# Patient Record
Sex: Female | Born: 1945 | Race: Black or African American | Hispanic: No | Marital: Married | Smoking: Former smoker
Health system: Southern US, Community
[De-identification: ages and names within clinical notes are randomized; demographics above are authoritative.]

## PROBLEM LIST (undated history)

## (undated) DIAGNOSIS — I639 Cerebral infarction, unspecified: Secondary | ICD-10-CM

## (undated) DIAGNOSIS — M1990 Unspecified osteoarthritis, unspecified site: Secondary | ICD-10-CM

## (undated) DIAGNOSIS — K219 Gastro-esophageal reflux disease without esophagitis: Secondary | ICD-10-CM

## (undated) DIAGNOSIS — R002 Palpitations: Secondary | ICD-10-CM

## (undated) DIAGNOSIS — D649 Anemia, unspecified: Secondary | ICD-10-CM

## (undated) DIAGNOSIS — I10 Essential (primary) hypertension: Secondary | ICD-10-CM

## (undated) DIAGNOSIS — M8580 Other specified disorders of bone density and structure, unspecified site: Secondary | ICD-10-CM

## (undated) DIAGNOSIS — K635 Polyp of colon: Secondary | ICD-10-CM

## (undated) DIAGNOSIS — E7800 Pure hypercholesterolemia, unspecified: Secondary | ICD-10-CM

## (undated) HISTORY — DX: Palpitations: R00.2

## (undated) HISTORY — DX: Polyp of colon: K63.5

## (undated) HISTORY — DX: Unspecified osteoarthritis, unspecified site: M19.90

## (undated) HISTORY — DX: Cerebral infarction, unspecified: I63.9

## (undated) HISTORY — DX: Other specified disorders of bone density and structure, unspecified site: M85.80

## (undated) HISTORY — DX: Essential (primary) hypertension: I10

## (undated) HISTORY — DX: Gastro-esophageal reflux disease without esophagitis: K21.9

## (undated) HISTORY — DX: Anemia, unspecified: D64.9

## (undated) HISTORY — DX: Pure hypercholesterolemia, unspecified: E78.00

---

## 1997-08-20 ENCOUNTER — Emergency Department (HOSPITAL_COMMUNITY): Admission: EM | Admit: 1997-08-20 | Discharge: 1997-08-20 | Payer: Self-pay | Admitting: Emergency Medicine

## 1997-09-27 ENCOUNTER — Encounter: Admission: RE | Admit: 1997-09-27 | Discharge: 1997-12-26 | Payer: Self-pay | Admitting: Orthopedic Surgery

## 1998-01-17 ENCOUNTER — Encounter: Payer: Self-pay | Admitting: Nephrology

## 1998-01-17 ENCOUNTER — Ambulatory Visit (HOSPITAL_COMMUNITY): Admission: RE | Admit: 1998-01-17 | Discharge: 1998-01-17 | Payer: Self-pay | Admitting: Nephrology

## 1999-06-06 ENCOUNTER — Encounter: Payer: Self-pay | Admitting: Nephrology

## 1999-06-06 ENCOUNTER — Ambulatory Visit (HOSPITAL_COMMUNITY): Admission: RE | Admit: 1999-06-06 | Discharge: 1999-06-06 | Payer: Self-pay | Admitting: Nephrology

## 2003-03-08 ENCOUNTER — Encounter: Admission: RE | Admit: 2003-03-08 | Discharge: 2003-03-08 | Payer: Self-pay | Admitting: Family Medicine

## 2003-09-24 ENCOUNTER — Ambulatory Visit (HOSPITAL_COMMUNITY): Admission: RE | Admit: 2003-09-24 | Discharge: 2003-09-24 | Payer: Self-pay | Admitting: Sports Medicine

## 2005-06-07 ENCOUNTER — Encounter: Admission: RE | Admit: 2005-06-07 | Discharge: 2005-06-07 | Payer: Self-pay | Admitting: Family Medicine

## 2007-03-10 LAB — HM COLONOSCOPY

## 2008-10-26 ENCOUNTER — Encounter: Admission: RE | Admit: 2008-10-26 | Discharge: 2008-10-26 | Payer: Self-pay | Admitting: Family Medicine

## 2008-10-26 LAB — HM DEXA SCAN

## 2010-01-04 ENCOUNTER — Encounter: Admission: RE | Admit: 2010-01-04 | Discharge: 2010-01-04 | Payer: Self-pay | Admitting: Family Medicine

## 2010-04-29 ENCOUNTER — Encounter: Payer: Self-pay | Admitting: Family Medicine

## 2010-05-01 ENCOUNTER — Encounter: Payer: Self-pay | Admitting: Family Medicine

## 2012-02-26 ENCOUNTER — Other Ambulatory Visit: Payer: Self-pay | Admitting: Family Medicine

## 2012-02-26 DIAGNOSIS — Z1231 Encounter for screening mammogram for malignant neoplasm of breast: Secondary | ICD-10-CM

## 2012-04-03 ENCOUNTER — Ambulatory Visit
Admission: RE | Admit: 2012-04-03 | Discharge: 2012-04-03 | Disposition: A | Discharge status: Home / Self Care | Payer: BC Managed Care – PPO | Source: Ambulatory Visit | Attending: Family Medicine | Admitting: Family Medicine

## 2012-04-03 DIAGNOSIS — Z1231 Encounter for screening mammogram for malignant neoplasm of breast: Secondary | ICD-10-CM

## 2013-03-19 ENCOUNTER — Encounter: Payer: Self-pay | Admitting: Family Medicine

## 2013-03-19 ENCOUNTER — Ambulatory Visit (INDEPENDENT_AMBULATORY_CARE_PROVIDER_SITE_OTHER): Payer: Medicare Other | Admitting: Family Medicine

## 2013-03-19 VITALS — BP 110/70 | HR 94 | Temp 98.2°F | Resp 18 | Ht 65.5 in | Wt 178.0 lb

## 2013-03-19 DIAGNOSIS — R002 Palpitations: Secondary | ICD-10-CM | POA: Insufficient documentation

## 2013-03-19 DIAGNOSIS — Z86010 Personal history of colonic polyps: Secondary | ICD-10-CM

## 2013-03-19 DIAGNOSIS — Z0000 Encounter for general adult medical examination without abnormal findings: Secondary | ICD-10-CM

## 2013-03-19 DIAGNOSIS — K635 Polyp of colon: Secondary | ICD-10-CM | POA: Insufficient documentation

## 2013-03-19 DIAGNOSIS — Z23 Encounter for immunization: Secondary | ICD-10-CM

## 2013-03-19 DIAGNOSIS — M8580 Other specified disorders of bone density and structure, unspecified site: Secondary | ICD-10-CM | POA: Insufficient documentation

## 2013-03-19 NOTE — Progress Notes (Signed)
Subjective:    Patient ID: Nina Olson, female    DOB: 1946-03-30, 67 y.o.   MRN: [id number]  HPI Patient is a very pleasant 67 year old African American female who presents today for complete physical exam. Of note she complains of right lower quadrant abdominal tenderness. This began last week. It was moderate in intensity for several days and then spontaneously improved over the next 3-4. Today is essentially gone. She is feeling fine right now. She denies any fevers or chills. She denies any nausea vomiting or diarrhea. She denies any melena or hematochezia. Otherwise she is doing well. She has no other concerns. She is due today for a flu shot as well as Prevnar 13. She is due for mammogram. Her last colonoscopy was in 2008. She was found to have a sessile polyp. She is due for colonoscopy. Her last Pap smear was in November of 2013 was normal. Her last tetanus shot was in 1998 and is due. Past Medical History  Diagnosis Date  . Osteopenia   . Palpitations   . GERD (gastroesophageal reflux disease)   . Colon polyps    Past Surgical History  Procedure Laterality Date  . Cesarean section     No current outpatient prescriptions on file prior to visit.   No current facility-administered medications on file prior to visit.   No Known Allergies History   Social History  . Marital Status: Married    Spouse Name: N/A    Number of Children: N/A  . Years of Education: N/A   Occupational History  . Not on file.   Social History Main Topics  . Smoking status: Former Games developer  . Smokeless tobacco: Former Neurosurgeon    Quit date: 04/09/1978  . Alcohol Use: No  . Drug Use: No  . Sexual Activity: No   Other Topics Concern  . Not on file   Social History Narrative  . No narrative on file   Family History  Problem Relation Age of Onset  . Hypertension Mother   . Osteoporosis Mother       Review of Systems  All other systems reviewed and are negative.       Objective:   Physical Exam  Vitals reviewed. Constitutional: She is oriented to person, place, and time. She appears well-developed and well-nourished. No distress.  HENT:  Head: Normocephalic and atraumatic.  Right Ear: External ear normal.  Left Ear: External ear normal.  Nose: Nose normal.  Mouth/Throat: Oropharynx is clear and moist. No oropharyngeal exudate.  Eyes: Conjunctivae and EOM are normal. Pupils are equal, round, and reactive to light. Right eye exhibits no discharge. Left eye exhibits no discharge. No scleral icterus.  Neck: Normal range of motion. Neck supple. No JVD present. No tracheal deviation present. No thyromegaly present.  Cardiovascular: Normal rate, regular rhythm, normal heart sounds and intact distal pulses.  Exam reveals no gallop and no friction rub.   No murmur heard. Pulmonary/Chest: Effort normal and breath sounds normal. No stridor. No respiratory distress. She has no wheezes. She has no rales. She exhibits no tenderness.  Abdominal: Soft. Bowel sounds are normal. She exhibits no distension. There is no tenderness. There is no rebound and no guarding.  Musculoskeletal: Normal range of motion. She exhibits no edema and no tenderness.  Lymphadenopathy:    She has no cervical adenopathy.  Neurological: She is alert and oriented to person, place, and time. She has normal reflexes. She displays normal reflexes. No cranial nerve deficit. She exhibits  normal muscle tone. Coordination normal.  Skin: Skin is warm. No rash noted. She is not diaphoretic. No erythema. No pallor.  Psychiatric: She has a normal mood and affect. Her behavior is normal. Judgment and thought content normal.   Patient is slightly tender to palpation in the right lower quadrant. There is no guarding or rebound. There is no evidence of peritoneal signs       Assessment & Plan:  1. Need for prophylactic vaccination and inoculation against influenza - Flu Vaccine QUAD 36+ mos IM  2. Need for  prophylactic vaccination against Streptococcus pneumoniae (pneumococcus) - Pneumococcal conjugate vaccine 13-valent IM  3. Routine general medical examination at a health care facility She declines a tetanus shot. Her flu shot and Prevnar 13 are updated. I will schedule the patient for colonoscopy as well as mammogram. She is to return fasting for a CBC, fasting lipid panel, CMP, and TSH. Her Pap smear is up to date. However the patient CAT scan of the abdomen and pelvis to rule out appendicitis. This is highly unlikely given the fact her symptoms are improving steadily for the last 3-4 days. Patient is leaving to go to [facility]. She declines the CT scan now. However she states she will immediately seek medical attention if the pain returns.

## 2013-03-20 ENCOUNTER — Other Ambulatory Visit: Payer: Self-pay | Admitting: Family Medicine

## 2013-03-20 DIAGNOSIS — Z1231 Encounter for screening mammogram for malignant neoplasm of breast: Secondary | ICD-10-CM

## 2013-04-23 ENCOUNTER — Ambulatory Visit: Payer: BC Managed Care – PPO

## 2013-05-04 ENCOUNTER — Other Ambulatory Visit: Payer: Medicare Other

## 2013-05-04 LAB — CBC WITH DIFFERENTIAL/PLATELET
Basophils Absolute: 0 10*3/uL (ref 0.0–0.1)
Basophils Relative: 1 % (ref 0–1)
Eosinophils Absolute: 0.2 10*3/uL (ref 0.0–0.7)
Eosinophils Relative: 5 % (ref 0–5)
HCT: 36 % (ref 36.0–46.0)
Hemoglobin: 11.7 g/dL — ABNORMAL LOW (ref 12.0–15.0)
Lymphocytes Relative: 41 % (ref 12–46)
Lymphs Abs: 1.7 10*3/uL (ref 0.7–4.0)
MCH: 26.7 pg (ref 26.0–34.0)
MCHC: 32.5 g/dL (ref 30.0–36.0)
MCV: 82 fL (ref 78.0–100.0)
Monocytes Absolute: 0.6 10*3/uL (ref 0.1–1.0)
Monocytes Relative: 13 % — ABNORMAL HIGH (ref 3–12)
Neutro Abs: 1.7 10*3/uL (ref 1.7–7.7)
Neutrophils Relative %: 40 % — ABNORMAL LOW (ref 43–77)
Platelets: 286 10*3/uL (ref 150–400)
RBC: 4.39 MIL/uL (ref 3.87–5.11)
RDW: 15.3 % (ref 11.5–15.5)
WBC: 4.2 10*3/uL (ref 4.0–10.5)

## 2013-05-04 LAB — COMPLETE METABOLIC PANEL WITH GFR
ALT: 15 U/L (ref 0–35)
AST: 17 U/L (ref 0–37)
Albumin: 3.9 g/dL (ref 3.5–5.2)
Alkaline Phosphatase: 91 U/L (ref 39–117)
BUN: 12 mg/dL (ref 6–23)
CO2: 28 mEq/L (ref 19–32)
Calcium: 9.3 mg/dL (ref 8.4–10.5)
Chloride: 107 mEq/L (ref 96–112)
Creat: 0.93 mg/dL (ref 0.50–1.10)
GFR, Est African American: 74 mL/min
GFR, Est Non African American: 64 mL/min
Glucose, Bld: 96 mg/dL (ref 70–99)
Potassium: 4 mEq/L (ref 3.5–5.3)
Sodium: 141 mEq/L (ref 135–145)
Total Bilirubin: 0.5 mg/dL (ref 0.3–1.2)
Total Protein: 6.9 g/dL (ref 6.0–8.3)

## 2013-05-04 LAB — LIPID PANEL
Cholesterol: 200 mg/dL (ref 0–200)
HDL: 41 mg/dL (ref >39)
LDL Cholesterol: 133 mg/dL — ABNORMAL HIGH (ref 0–99)
Total CHOL/HDL Ratio: 4.9 Ratio
Triglycerides: 128 mg/dL (ref <150)
VLDL: 26 mg/dL (ref 0–40)

## 2013-05-04 LAB — TSH: TSH: 0.771 u[IU]/mL (ref 0.350–4.500)

## 2013-05-05 ENCOUNTER — Encounter: Payer: Self-pay | Admitting: Family Medicine

## 2013-05-08 ENCOUNTER — Encounter: Payer: Self-pay | Admitting: *Deleted

## 2013-05-23 ENCOUNTER — Other Ambulatory Visit: Payer: Self-pay | Admitting: Nurse Practitioner

## 2013-05-25 ENCOUNTER — Encounter: Payer: Self-pay | Admitting: Family Medicine

## 2013-05-27 ENCOUNTER — Telehealth: Payer: Self-pay | Admitting: Family Medicine

## 2013-05-27 NOTE — Telephone Encounter (Signed)
Call back number is [phone] Pt is needing a refill on her Metoprolol [facility] is [facility] [address]

## 2013-05-28 ENCOUNTER — Other Ambulatory Visit: Payer: Self-pay | Admitting: Family Medicine

## 2013-05-28 MED ORDER — METOPROLOL TARTRATE 25 MG PO TABS: 25.0000 mg | ORAL_TABLET | Freq: Two times a day (BID) | ORAL | Status: DC

## 2013-05-28 NOTE — Telephone Encounter (Signed)
Med refilled and per dr. Dennard Schaumann ntbs in 1 month and appt made

## 2013-05-28 NOTE — Telephone Encounter (Signed)
[facility] - Do not have medication on med list

## 2013-06-26 ENCOUNTER — Ambulatory Visit: Payer: Medicare Other | Admitting: Family Medicine

## 2013-07-14 ENCOUNTER — Ambulatory Visit (INDEPENDENT_AMBULATORY_CARE_PROVIDER_SITE_OTHER): Payer: Medicare Other | Admitting: Family Medicine

## 2013-07-14 ENCOUNTER — Encounter: Payer: Self-pay | Admitting: Family Medicine

## 2013-07-14 VITALS — BP 120/76 | HR 86 | Temp 97.0°F | Resp 16 | Ht 65.5 in | Wt 185.0 lb

## 2013-07-14 DIAGNOSIS — B354 Tinea corporis: Secondary | ICD-10-CM

## 2013-07-14 MED ORDER — FLUCONAZOLE 150 MG PO TABS: ORAL_TABLET | ORAL | Status: DC

## 2013-07-14 MED ORDER — CLOTRIMAZOLE-BETAMETHASONE 1-0.05 % EX CREA: 1.0000 "application " | TOPICAL_CREAM | Freq: Two times a day (BID) | CUTANEOUS | Status: DC

## 2013-07-14 NOTE — Progress Notes (Signed)
   Subjective:    Patient ID: Nina Olson, female    DOB: 18-Jun-1945, 68 y.o.   MRN: [id number]  HPI  Patient reports a rash on the right posterior aspect of her neck the last 3 weeks it is spreading. It is 5 cm x 3.5 cm in size. It has raised rolled edges and hyperpigmented center.  KOH today in office was positive for hyphae. Past Medical History  Diagnosis Date  . Osteopenia   . Palpitations   . GERD (gastroesophageal reflux disease)   . Colon polyps    Current Outpatient Prescriptions on File Prior to Visit  Medication Sig Dispense Refill  . metoprolol tartrate (LOPRESSOR) 25 MG tablet Take 1 tablet (25 mg total) by mouth 2 (two) times daily.  60 tablet  3   No current facility-administered medications on file prior to visit.   No Known Allergies History   Social History  . Marital Status: Married    Spouse Name: N/A    Number of Children: N/A  . Years of Education: N/A   Occupational History  . Not on file.   Social History Main Topics  . Smoking status: Former Research scientist (life sciences)  . Smokeless tobacco: Former Systems developer    Quit date: 04/09/1978  . Alcohol Use: No  . Drug Use: No  . Sexual Activity: No   Other Topics Concern  . Not on file   Social History Narrative  . No narrative on file      Review of Systems  All other systems reviewed and are negative.       Objective:   Physical Exam  Vitals reviewed. Cardiovascular: Normal rate and regular rhythm.   Pulmonary/Chest: Effort normal and breath sounds normal.  Skin: Rash noted. There is erythema.          Assessment & Plan:  1. Tinea corporis The effected area is very large and also near the hairline., Treated aggressively with Diflucan 1 tablet by mouth every other day for 2 weeks. I am also going to have the patient use Lotrisone cream apply twice a day until lesion completely heals after she finishes diflucan.  Recheck in 3 weeks, sooner if worse. - fluconazole (DIFLUCAN) 150 MG tablet; 1 pill every other  day until gone  Dispense: 7 tablet; Refill: 0 - clotrimazole-betamethasone (LOTRISONE) cream; Apply 1 application topically 2 (two) times daily.  Dispense: 30 g; Refill: 0

## 2013-07-21 ENCOUNTER — Encounter: Payer: Self-pay | Admitting: Family Medicine

## 2013-07-21 ENCOUNTER — Other Ambulatory Visit: Payer: Self-pay | Admitting: Family Medicine

## 2013-07-21 DIAGNOSIS — M8580 Other specified disorders of bone density and structure, unspecified site: Secondary | ICD-10-CM

## 2013-07-23 ENCOUNTER — Emergency Department (HOSPITAL_COMMUNITY)
Admission: EM | Admit: 2013-07-23 | Discharge: 2013-07-23 | Disposition: A | Discharge status: Home / Self Care | Payer: Medicare Other | Source: Home / Self Care | Attending: Family Medicine | Admitting: Family Medicine

## 2013-07-23 ENCOUNTER — Encounter (HOSPITAL_COMMUNITY): Payer: Self-pay | Admitting: Emergency Medicine

## 2013-07-23 DIAGNOSIS — M7551 Bursitis of right shoulder: Secondary | ICD-10-CM

## 2013-07-23 DIAGNOSIS — M719 Bursopathy, unspecified: Secondary | ICD-10-CM

## 2013-07-23 DIAGNOSIS — M67919 Unspecified disorder of synovium and tendon, unspecified shoulder: Secondary | ICD-10-CM

## 2013-07-23 MED ORDER — METHYLPREDNISOLONE ACETATE 40 MG/ML IJ SUSP
INTRAMUSCULAR | Status: AC
  Filled 2013-07-23: qty 5

## 2013-07-23 MED ORDER — METHYLPREDNISOLONE ACETATE 40 MG/ML IJ SUSP: 40.0000 mg | Freq: Once | INTRAMUSCULAR | Status: DC

## 2013-07-23 NOTE — ED Provider Notes (Signed)
Nina Olson is a 68 y.o. female who presents to Urgent Care today for right shoulder pain. Patient has one day of right shoulder pain for without injury. The pain is with overhand motion and better with rest. Radiating pain weakness or numbness. No medications tried. Symptoms are moderate to severe.   Past Medical History  Diagnosis Date  . Osteopenia   . Palpitations   . GERD (gastroesophageal reflux disease)   . Colon polyps    History  Substance Use Topics  . Smoking status: Former Research scientist (life sciences)  . Smokeless tobacco: Former Systems developer    Quit date: 04/09/1978  . Alcohol Use: No   ROS as above Medications: Current Facility-Administered Medications  Medication Dose Route Frequency Provider Last Rate Last Dose  . methylPREDNISolone acetate (DEPO-MEDROL) injection 40 mg  40 mg Intra-articular Once Gregor Hams, MD       Current Outpatient Prescriptions  Medication Sig Dispense Refill  . clotrimazole-betamethasone (LOTRISONE) cream Apply 1 application topically 2 (two) times daily.  30 g  0  . fluconazole (DIFLUCAN) 150 MG tablet 1 pill every other day until gone  7 tablet  0  . metoprolol tartrate (LOPRESSOR) 25 MG tablet Take 1 tablet (25 mg total) by mouth 2 (two) times daily.  60 tablet  3    Exam:  BP 144/84  Pulse 86  Temp(Src) 97.8 F (36.6 C) (Oral)  Resp 18  SpO2 97% Gen: Well NAD HEENT: EOMI,  MMM Lungs: Normal work of breathing. CTABL Heart: RRR no MRG Abd: NABS, Soft. NT, ND Exts: Brisk capillary refill, warm and well perfused.  Right shoulder: Normal-appearing nontender Range of motion limited active abduction to 90 and external rotation to 30 beyond neutral position. Internal rotation is normal.  Strength is reduced to abduction.  Positive impingement testing.  Positive drop arm sign.   Neck: Nontender full range of motion negative Spurling test.   Capillary refill and sensation are intact distally.  Limited musculoskeletal ultrasound right shoulder.  Biceps  tendon is intact gait within the groove however there is a hypoechoic fluid collection in the tendon sheath consistent with biceps tendinitis.  The subscapularis is normal appearing, and intact. However there is a fluid collection in the superior portion consistent with subacromial bursitis.  The supraspinatus tendon is intact without significant tears. There is a subacromial bursa fluid collection.  Infraspinatus is degenerative with hyperechoic change consistent with calcific tendinitis present distally.   The a.c. Joint is degenerative effusion present.  Subacromial Injection: Right  Consent obtained and time out performed.  Area cleaned with alcohol.  40Mg  of depomedrol and 74ml of 0.5% marcaine was injected into the subacromial bursa without complication or bleeding. Patient tolerated the procedure well.     No results found for this or any previous visit (from the past 24 hour(s)). No results found.  Assessment and Plan: 68 y.o. female with right shoulder subacromial impingement bursitis. Seen on ultrasound. Doubtful for full thickness tear. Plan to treat with corticosteroid injection and range of motion exercises. Followup with primary care provider.  Discussed warning signs or symptoms. Please see discharge instructions. Patient expresses understanding.    Gregor Hams, MD 07/23/13 [phone]

## 2013-07-23 NOTE — Discharge Instructions (Signed)
Thank you for coming in today. Followup with your primary care Dr. Work on gentle range of motion Call or go to the ER if you develop a large red swollen joint with extreme pain or oozing puss.

## 2013-07-23 NOTE — ED Notes (Addendum)
C/o right shoulder and neck pain States pain starts at neck and radiates down to elbow Denies any injury

## 2014-01-01 ENCOUNTER — Ambulatory Visit (INDEPENDENT_AMBULATORY_CARE_PROVIDER_SITE_OTHER): Payer: Medicare Other | Admitting: Family Medicine

## 2014-01-01 ENCOUNTER — Encounter: Payer: Self-pay | Admitting: Family Medicine

## 2014-01-01 VITALS — BP 132/80 | HR 100 | Temp 98.1°F | Resp 20 | Ht 65.0 in | Wt 182.0 lb

## 2014-01-01 DIAGNOSIS — Z0000 Encounter for general adult medical examination without abnormal findings: Secondary | ICD-10-CM

## 2014-01-01 DIAGNOSIS — Z23 Encounter for immunization: Secondary | ICD-10-CM

## 2014-01-01 NOTE — Progress Notes (Signed)
Subjective:    Patient ID: Nina Olson, female    DOB: 1945/11/18, 68 y.o.   MRN: [id number]  HPI  Subjective:   Patient presents for Medicare Annual/Subsequent preventive examination.  Patient presents today for complete physical exam. She has no medical concerns. She is overdue for a mammogram as well as a bone density. Her last Pap smear was 2 years ago. She is due for her next Pap smear next year. She had her flu shot earlier this year. She is due for Pneumovax 23. She had Prevnar 13 last year. Otherwise preventive care is up to date. I reviewed her lab work from January. This was significant for mild anemia as well as mild hyperlipidemia. Recommend over-the-counter iron and lifestyle changes. She is overdue to recheck this. Review Past Medical/Family/Social:   Past Medical History  Diagnosis Date  . Osteopenia   . Palpitations   . GERD (gastroesophageal reflux disease)   . Colon polyps    Past Surgical History  Procedure Laterality Date  . Cesarean section     Current Outpatient Prescriptions on File Prior to Visit  Medication Sig Dispense Refill  . metoprolol tartrate (LOPRESSOR) 25 MG tablet Take 1 tablet (25 mg total) by mouth 2 (two) times daily.  60 tablet  3   Current Facility-Administered Medications on File Prior to Visit  Medication Dose Route Frequency Provider Last Rate Last Dose  . methylPREDNISolone acetate (DEPO-MEDROL) injection 40 mg  40 mg Intra-articular Once Gregor Hams, MD       No Known Allergies History   Social History  . Marital Status: Married    Spouse Name: N/A    Number of Children: N/A  . Years of Education: N/A   Occupational History  . Not on file.   Social History Main Topics  . Smoking status: Former Research scientist (life sciences)  . Smokeless tobacco: Former Systems developer    Quit date: 04/09/1978  . Alcohol Use: No  . Drug Use: No  . Sexual Activity: No   Other Topics Concern  . Not on file   Social History Narrative  . No narrative on file   Family  History  Problem Relation Age of Onset  . Hypertension Mother   . Osteoporosis Mother     Depression Screen  (Note: if answer to either of the following is "Yes", a more complete depression screening is indicated)  Over the past two weeks, have you felt down, depressed or hopeless? No Over the past two weeks, have you felt little interest or pleasure in doing things? No Have you lost interest or pleasure in daily life? No Do you often feel hopeless? No Do you cry easily over simple problems? No   Activities of Daily Living  In your present state of health, do you have any difficulty performing the following activities?:  Driving? No  Managing money? No  Feeding yourself? No  Getting from bed to chair? No  Climbing a flight of stairs? No  Preparing food and eating?: No  Bathing or showering? No  Getting dressed: No  Getting to the toilet? No  Using the toilet:No  Moving around from place to place: No  In the past year have you fallen or had a near fall?:No  Are you sexually active? No  Do you have more than one partner? No   Hearing Difficulties: No  Do you often ask people to speak up or repeat themselves? No  Do you experience ringing or noises in your ears? No  Do you have difficulty understanding soft or whispered voices? No  Do you feel that you have a problem with memory? No Do you often misplace items? No  Do you feel safe at home? Yes  Cognitive Testing  Alert? Yes Normal Appearance?Yes  Oriented to person? Yes Place? Yes  Time? Yes  Recall of three objects? Yes  Can perform simple calculations? Yes  Displays appropriate judgment?Yes  Can read the correct time from a watch face?Yes   Screening Tests / Date Colonoscopy   2008                  Zostavax  Mammogram 2013 Influenza Vaccine 2015 Tetanus/tdap   Review of Systems  All other systems reviewed and are negative.      Objective:   Physical Exam  Vitals reviewed. Constitutional: She is oriented  to person, place, and time. She appears well-developed and well-nourished. No distress.  HENT:  Head: Normocephalic and atraumatic.  Right Ear: External ear normal.  Left Ear: External ear normal.  Nose: Nose normal.  Mouth/Throat: Oropharynx is clear and moist. No oropharyngeal exudate.  Eyes: Conjunctivae and EOM are normal. Pupils are equal, round, and reactive to light. Right eye exhibits no discharge. Left eye exhibits no discharge. No scleral icterus.  Neck: Normal range of motion. Neck supple. No JVD present. No tracheal deviation present. No thyromegaly present.  Cardiovascular: Normal rate, regular rhythm, normal heart sounds and intact distal pulses.  Exam reveals no gallop and no friction rub.   No murmur heard. Pulmonary/Chest: Effort normal and breath sounds normal. No stridor. No respiratory distress. She has no wheezes. She has no rales. She exhibits no tenderness.  Abdominal: Soft. Bowel sounds are normal. She exhibits no distension and no mass. There is no tenderness. There is no rebound and no guarding.  Musculoskeletal: Normal range of motion. She exhibits no edema and no tenderness.  Lymphadenopathy:    She has no cervical adenopathy.  Neurological: She is alert and oriented to person, place, and time. She has normal reflexes. She displays normal reflexes. No cranial nerve deficit. She exhibits normal muscle tone. Coordination normal.  Skin: Skin is warm. No rash noted. She is not diaphoretic. No erythema. No pallor.  Psychiatric: She has a normal mood and affect. Her behavior is normal. Judgment and thought content normal.          Assessment & Plan:  Routine general medical examination at a health care facility - Plan: CBC with Differential, COMPLETE METABOLIC PANEL WITH GFR, Lipid panel, MM Digital Screening, DG Bone Density, Ambulatory referral to Gastroenterology  Patient's physical exam is normal. I did recommend diet exercise and weight loss. Her blood pressures  several. I like to return fasting for a CMP, fasting lipid panel, and CBC. I don't to monitor her anemia now that she is taking iron. Also will monitor her cholesterol. Her goal LDL cholesterol is less than 130. I scheduled her for colonoscopy, mammogram, bone density test. The patient received Pneumovax 23 today in clinic. Her flu shot is up-to-date. She is due for a colonoscopy due to her history of colon polyps every 5 years.. Medicare Attestation  I have personally reviewed:  The patient's medical and social history  Their use of alcohol, tobacco or illicit drugs  Their current medications and supplements  The patient's functional ability including ADLs,fall risks, home safety risks, cognitive, and hearing and visual impairment  Diet and physical activities  Evidence for depression or mood disorders  The  patient's weight, height, BMI, and visual acuity have been recorded in the chart. I have made referrals, counseling, and provided education to the patient based on review of the above and I have provided the patient with a written personalized care plan for preventive services.

## 2014-01-04 ENCOUNTER — Encounter: Payer: Self-pay | Admitting: *Deleted

## 2014-01-11 ENCOUNTER — Other Ambulatory Visit: Payer: Medicare Other

## 2014-01-11 DIAGNOSIS — Z0000 Encounter for general adult medical examination without abnormal findings: Secondary | ICD-10-CM

## 2014-01-11 LAB — CBC WITH DIFFERENTIAL/PLATELET
BASOS PCT: 1 % (ref 0–1)
Basophils Absolute: 0.1 10*3/uL (ref 0.0–0.1)
EOS ABS: 0.3 10*3/uL (ref 0.0–0.7)
EOS PCT: 6 % — AB (ref 0–5)
HCT: 36.9 % (ref 36.0–46.0)
HEMOGLOBIN: 12.5 g/dL (ref 12.0–15.0)
Lymphocytes Relative: 39 % (ref 12–46)
Lymphs Abs: 2 10*3/uL (ref 0.7–4.0)
MCH: 27.2 pg (ref 26.0–34.0)
MCHC: 33.9 g/dL (ref 30.0–36.0)
MCV: 80.2 fL (ref 78.0–100.0)
MONO ABS: 0.7 10*3/uL (ref 0.1–1.0)
Monocytes Relative: 14 % — ABNORMAL HIGH (ref 3–12)
NEUTROS ABS: 2 10*3/uL (ref 1.7–7.7)
Neutrophils Relative %: 40 % — ABNORMAL LOW (ref 43–77)
Platelets: 274 10*3/uL (ref 150–400)
RBC: 4.6 MIL/uL (ref 3.87–5.11)
RDW: 14.8 % (ref 11.5–15.5)
WBC: 5 10*3/uL (ref 4.0–10.5)

## 2014-01-11 LAB — LIPID PANEL
CHOL/HDL RATIO: 5.3 ratio
CHOLESTEROL: 203 mg/dL — AB (ref 0–200)
HDL: 38 mg/dL — AB (ref >39)
LDL CALC: 132 mg/dL — AB (ref 0–99)
TRIGLYCERIDES: 165 mg/dL — AB (ref <150)
VLDL: 33 mg/dL (ref 0–40)

## 2014-01-11 LAB — COMPLETE METABOLIC PANEL WITH GFR
ALBUMIN: 4.1 g/dL (ref 3.5–5.2)
ALT: 15 U/L (ref 0–35)
AST: 16 U/L (ref 0–37)
Alkaline Phosphatase: 106 U/L (ref 39–117)
BILIRUBIN TOTAL: 0.4 mg/dL (ref 0.2–1.2)
BUN: 13 mg/dL (ref 6–23)
CHLORIDE: 103 meq/L (ref 96–112)
CO2: 27 meq/L (ref 19–32)
Calcium: 9.4 mg/dL (ref 8.4–10.5)
Creat: 0.97 mg/dL (ref 0.50–1.10)
GFR, EST AFRICAN AMERICAN: 70 mL/min
GFR, EST NON AFRICAN AMERICAN: 61 mL/min
Glucose, Bld: 103 mg/dL — ABNORMAL HIGH (ref 70–99)
Potassium: 4 mEq/L (ref 3.5–5.3)
SODIUM: 139 meq/L (ref 135–145)
TOTAL PROTEIN: 7 g/dL (ref 6.0–8.3)

## 2014-01-12 ENCOUNTER — Telehealth: Payer: Self-pay | Admitting: *Deleted

## 2014-01-12 NOTE — Telephone Encounter (Signed)
pt has appt with Dr. Steve Rattler on 01/19/14 at 200pm, lmom for pt to return call

## 2014-01-13 ENCOUNTER — Encounter: Payer: Self-pay | Admitting: Family Medicine

## 2014-01-14 NOTE — Telephone Encounter (Signed)
Left message on vm to return my call, also left on vm of appt date/time and location of appt

## 2014-03-09 ENCOUNTER — Other Ambulatory Visit: Payer: Medicare Other

## 2014-03-09 ENCOUNTER — Ambulatory Visit: Payer: Medicare Other

## 2014-03-30 ENCOUNTER — Other Ambulatory Visit: Payer: Self-pay | Admitting: Family Medicine

## 2014-03-30 ENCOUNTER — Ambulatory Visit: Payer: Medicare Other

## 2014-03-30 ENCOUNTER — Ambulatory Visit
Admission: RE | Admit: 2014-03-30 | Discharge: 2014-03-30 | Disposition: A | Discharge status: Home / Self Care | Payer: Medicare Other | Source: Ambulatory Visit | Attending: Family Medicine | Admitting: Family Medicine

## 2014-03-30 DIAGNOSIS — M8580 Other specified disorders of bone density and structure, unspecified site: Secondary | ICD-10-CM

## 2014-03-30 DIAGNOSIS — Z1231 Encounter for screening mammogram for malignant neoplasm of breast: Secondary | ICD-10-CM

## 2014-04-06 ENCOUNTER — Encounter: Payer: Self-pay | Admitting: Family Medicine

## 2014-04-22 ENCOUNTER — Encounter: Payer: Self-pay | Admitting: Family Medicine

## 2014-05-26 ENCOUNTER — Encounter: Payer: Self-pay | Admitting: Family Medicine

## 2014-05-26 ENCOUNTER — Ambulatory Visit (INDEPENDENT_AMBULATORY_CARE_PROVIDER_SITE_OTHER): Payer: Medicare Other | Admitting: Family Medicine

## 2014-05-26 VITALS — BP 118/66 | HR 76 | Temp 98.0°F | Resp 18 | Ht 65.0 in | Wt 184.0 lb

## 2014-05-26 DIAGNOSIS — M25512 Pain in left shoulder: Secondary | ICD-10-CM

## 2014-05-26 DIAGNOSIS — R079 Chest pain, unspecified: Secondary | ICD-10-CM

## 2014-05-26 MED ORDER — TETANUS-DIPHTH-ACELL PERTUSSIS 5-2.5-18.5 LF-MCG/0.5 IM SUSP: 0.5000 mL | Freq: Once | INTRAMUSCULAR | Status: DC

## 2014-05-26 MED ORDER — MELOXICAM 7.5 MG PO TABS: 7.5000 mg | ORAL_TABLET | Freq: Every day | ORAL | Status: DC

## 2014-05-26 NOTE — Progress Notes (Signed)
Patient ID: Nina Olson, female   DOB: 03/24/1946, 69 y.o.   MRN: [id number]   Subjective:    Patient ID: Nina Olson, female    DOB: 23-Feb-1946, 69 y.o.   MRN: [id number]  Patient presents for L Arm Pain  patient here with left shoulder and arm pain that radiates towards the forearm she also gets pain into her hand feels like her right hand is much weaker. She occasionally has some tingling in the left hand but also gets it in the right hand. She denies any injury to her neck or shoulder. This is been going on for the past 6 months or so. She's also had some episodes of  Brief chest tightness, across chest, last time unknown no SOB, diaphoresis associated or nausea and some acid reflux but she has never had this evaluated. Of note she is leaving to go out of town for the next month to be with her daughter and will like to have this evaluated. She has not taken any over-the-counter medications for the pain.    Review Of Systems:  GEN- denies fatigue, fever, weight loss,weakness, recent illness HEENT- denies eye drainage, change in vision, nasal discharge, CVS- denies chest pain, palpitations RESP- denies SOB, cough, wheeze ABD- denies N/V, change in stools, abd pain GU- denies dysuria, hematuria, dribbling, incontinence MSK- + joint pain, muscle aches, injury Neuro- denies headache, dizziness, syncope, seizure activity       Objective:    BP 118/66 mmHg  Pulse 76  Temp(Src) 98 F (36.7 C) (Oral)  Resp 18  Ht 5\' 5"  (1.651 m)  Wt 184 lb (83.462 kg)  BMI 30.62 kg/m2 GEN- NAD, alert and oriented x3 HEENT- PERRL, EOMI, non injected sclera, pink conjunctiva, MMM, oropharynx clear Neck- Supple,  CVS- RRR, no murmur RESP-CTAB MSK- fair ROM neck, Spine NT, Bilat shoulder normal inspection, rotator cuff in tact bilat, biceps in tact, strength equal bilat UE Neuro- normal tone, decreased grasp left hand( old injury), sensation grossly in tact EXT- No edema Pulses- Radial, DP-  2+  EKG-NSR      Assessment & Plan:      Problem List Items Addressed This Visit    None    Visit Diagnoses    Chest pain, unspecified chest pain type    -  Primary    Atypical CP , EKG normal, mostly likly Gerd related on nexium    Relevant Orders    EKG 12-Lead (Completed)    Pain in joint, shoulder region, left        possible OA of shoulder or nerve impingment from neck causing symptoms and weakness, obtain Cpsine, shoulder xray, Good ROM, Start low dose Mobic, plan for ortho based on results    Relevant Orders    DG Cervical Spine Complete    DG Shoulder Left       Note: This dictation was prepared with Dragon dictation along with smaller phrase technology. Any transcriptional errors that result from this process are unintentional.

## 2014-05-26 NOTE — Patient Instructions (Signed)
Take the anti-inflammatory once a day with food Get the xrays done at  [facility] [place] EKG is normal F/U pending results

## 2014-05-27 ENCOUNTER — Ambulatory Visit
Admission: RE | Admit: 2014-05-27 | Discharge: 2014-05-27 | Disposition: A | Discharge status: Home / Self Care | Payer: Medicare Other | Source: Ambulatory Visit | Attending: Family Medicine | Admitting: Family Medicine

## 2014-05-27 DIAGNOSIS — M25512 Pain in left shoulder: Secondary | ICD-10-CM

## 2014-06-15 ENCOUNTER — Encounter: Payer: Self-pay | Admitting: Family Medicine

## 2014-10-26 ENCOUNTER — Ambulatory Visit (INDEPENDENT_AMBULATORY_CARE_PROVIDER_SITE_OTHER): Payer: Medicare Other | Admitting: Family Medicine

## 2014-10-26 ENCOUNTER — Encounter: Payer: Self-pay | Admitting: Family Medicine

## 2014-10-26 VITALS — BP 132/74 | HR 78 | Temp 98.5°F | Resp 16 | Ht 65.0 in | Wt 186.0 lb

## 2014-10-26 DIAGNOSIS — J0100 Acute maxillary sinusitis, unspecified: Secondary | ICD-10-CM

## 2014-10-26 MED ORDER — AMOXICILLIN 875 MG PO TABS: 875.0000 mg | ORAL_TABLET | Freq: Two times a day (BID) | ORAL | Status: DC

## 2014-10-26 NOTE — Progress Notes (Signed)
   Subjective:    Patient ID: Nina Olson, female    DOB: December 12, 1945, 69 y.o.   MRN: [id number]  HPI  patient has had bilateral maxillary sinus pain and pressure and frontal sinus pain and pressure for  One week. She reports postnasal drip, sore throat, and dull headache. She is tender to palpation in her left maxillary sinus. She is also having copious rhinorrhea. Past Medical History  Diagnosis Date  . Osteopenia   . Palpitations   . GERD (gastroesophageal reflux disease)   . Colon polyps    Past Surgical History  Procedure Laterality Date  . Cesarean section     Current Outpatient Prescriptions on File Prior to Visit  Medication Sig Dispense Refill  . Esomeprazole Magnesium (NEXIUM PO) Take 1 tablet by mouth daily. Taking the OTC brand    . meloxicam (MOBIC) 7.5 MG tablet Take 1 tablet (7.5 mg total) by mouth daily. 30 tablet 2  . metoprolol tartrate (LOPRESSOR) 25 MG tablet Take 1 tablet (25 mg total) by mouth 2 (two) times daily. 60 tablet 3  . Multiple Vitamin (MULTIVITAMIN) tablet Take 1 tablet by mouth daily.     Current Facility-Administered Medications on File Prior to Visit  Medication Dose Route Frequency Provider Last Rate Last Dose  . methylPREDNISolone acetate (DEPO-MEDROL) injection 40 mg  40 mg Intra-articular Once Gregor Hams, MD       No Known Allergies History   Social History  . Marital Status: Married    Spouse Name: N/A  . Number of Children: N/A  . Years of Education: N/A   Occupational History  . Not on file.   Social History Main Topics  . Smoking status: Former Research scientist (life sciences)  . Smokeless tobacco: Former Systems developer    Quit date: 04/09/1978  . Alcohol Use: No  . Drug Use: No  . Sexual Activity: No   Other Topics Concern  . Not on file   Social History Narrative      Review of Systems  All other systems reviewed and are negative.      Objective:   Physical Exam  Constitutional: She appears well-developed and well-nourished.  HENT:  Right  Ear: External ear normal.  Left Ear: External ear normal.  Nose: Mucosal edema and rhinorrhea present. Right sinus exhibits maxillary sinus tenderness. Left sinus exhibits maxillary sinus tenderness.  Mouth/Throat: Oropharynx is clear and moist. No oropharyngeal exudate.  Eyes: Conjunctivae are normal.  Neck: Neck supple.  Cardiovascular: Normal rate, regular rhythm and normal heart sounds.   Pulmonary/Chest: Effort normal and breath sounds normal. No respiratory distress. She has no wheezes. She has no rales.  Lymphadenopathy:    She has no cervical adenopathy.  Vitals reviewed.         Assessment & Plan:  Acute maxillary sinusitis, recurrence not specified - Plan: amoxicillin (AMOXIL) 875 MG tablet   Patient has a sinus infection. Begin amoxicillin 875 mg by mouth twice a day for 10 days and use Flonase for symptomatic relief

## 2015-04-26 ENCOUNTER — Encounter: Payer: Self-pay | Admitting: Family Medicine

## 2015-04-26 ENCOUNTER — Ambulatory Visit (INDEPENDENT_AMBULATORY_CARE_PROVIDER_SITE_OTHER): Payer: Medicare Other | Admitting: Family Medicine

## 2015-04-26 VITALS — BP 156/80 | HR 104 | Temp 98.4°F | Resp 16 | Ht 65.0 in | Wt 191.0 lb

## 2015-04-26 DIAGNOSIS — I10 Essential (primary) hypertension: Secondary | ICD-10-CM

## 2015-04-26 MED ORDER — ESOMEPRAZOLE MAGNESIUM 40 MG PO CPDR: 40.0000 mg | DELAYED_RELEASE_CAPSULE | Freq: Every day | ORAL | Status: DC

## 2015-04-26 MED ORDER — LOSARTAN POTASSIUM 100 MG PO TABS: 100.0000 mg | ORAL_TABLET | Freq: Every day | ORAL | Status: DC

## 2015-04-26 NOTE — Progress Notes (Signed)
   Subjective:    Patient ID: Nina Olson, female    DOB: Mar 25, 1946, 70 y.o.   MRN: [id number]  HPI  Recently the patient's blood pressure has been elevated between 150 and 160/80-90. She denies any chest pain or shortness of breath. She recently has had increased acid reflux, a constant cough, and episodic sore throat. This is been off and on over the last several months and she believes it may be due to acid reflux. She also had 3 days of left lower quadrant abdominal pain last week that was rated 6/10 in intensity. It gradually resolved on its own. She denies any nausea or vomiting. She denies any diarrhea or constipation. She denies any blood in her stool or black tarry stools. She denies any fever or weight loss Past Medical History  Diagnosis Date  . Osteopenia   . Palpitations   . GERD (gastroesophageal reflux disease)   . Colon polyps    Past Surgical History  Procedure Laterality Date  . Cesarean section     No current outpatient prescriptions on file prior to visit.   Current Facility-Administered Medications on File Prior to Visit  Medication Dose Route Frequency Provider Last Rate Last Dose  . methylPREDNISolone acetate (DEPO-MEDROL) injection 40 mg  40 mg Intra-articular Once Gregor Hams, MD       No Known Allergies Social History   Social History  . Marital Status: Married    Spouse Name: N/A  . Number of Children: N/A  . Years of Education: N/A   Occupational History  . Not on file.   Social History Main Topics  . Smoking status: Former Research scientist (life sciences)  . Smokeless tobacco: Former Systems developer    Quit date: 04/09/1978  . Alcohol Use: No  . Drug Use: No  . Sexual Activity: No   Other Topics Concern  . Not on file   Social History Narrative     Review of Systems  All other systems reviewed and are negative.      Objective:   Physical Exam  Constitutional: She appears well-developed and well-nourished.  HENT:  Mouth/Throat: Oropharynx is clear and moist. No  oropharyngeal exudate.  Cardiovascular: Normal rate, regular rhythm and normal heart sounds.   No murmur heard. Pulmonary/Chest: Effort normal and breath sounds normal. No respiratory distress. She has no wheezes. She has no rales. She exhibits no tenderness.  Abdominal: Soft. Bowel sounds are normal. She exhibits no distension and no mass. There is no tenderness. There is no rebound and no guarding.  Lymphadenopathy:    She has no cervical adenopathy.  Vitals reviewed.         Assessment & Plan:  Benign essential HTN - Plan: losartan (COZAAR) 100 MG tablet  Begin losartan 100 mg by mouth daily for hypertension and recheck blood pressure in one month. I do want the patient to begin Nexium 40 mg a day for acid reflux which I believe is causing her heartburn and possibly causing her episodic cough and episodic sore throat which does not seem to be infectious in origin. It is difficult to ascertain what was the cause of her abdominal pain last week but it may have been a mild case of diverticulitis. Thankfully it is resolved spontaneously and the present time I would simply monitor her clinically for any signs of recurrence

## 2015-06-17 ENCOUNTER — Encounter (HOSPITAL_COMMUNITY): Payer: Self-pay | Admitting: Emergency Medicine

## 2015-06-17 ENCOUNTER — Emergency Department (HOSPITAL_COMMUNITY)
Admission: EM | Admit: 2015-06-17 | Discharge: 2015-06-17 | Disposition: A | Discharge status: Home / Self Care | Payer: Medicare Other | Source: Home / Self Care | Attending: Emergency Medicine | Admitting: Emergency Medicine

## 2015-06-17 DIAGNOSIS — K047 Periapical abscess without sinus: Secondary | ICD-10-CM | POA: Diagnosis not present

## 2015-06-17 DIAGNOSIS — A084 Viral intestinal infection, unspecified: Secondary | ICD-10-CM | POA: Diagnosis not present

## 2015-06-17 MED ORDER — ONDANSETRON HCL 4 MG PO TABS: 4.0000 mg | ORAL_TABLET | Freq: Three times a day (TID) | ORAL | Status: DC | PRN

## 2015-06-17 MED ORDER — ONDANSETRON 4 MG PO TBDP
4.0000 mg | ORAL_TABLET | Freq: Once | ORAL | Status: AC
  Administered 2015-06-17: 4 mg via ORAL

## 2015-06-17 MED ORDER — ONDANSETRON 4 MG PO TBDP
ORAL_TABLET | ORAL | Status: AC
  Filled 2015-06-17: qty 1

## 2015-06-17 MED ORDER — AMOXICILLIN 500 MG PO CAPS: 1000.0000 mg | ORAL_CAPSULE | Freq: Two times a day (BID) | ORAL | Status: DC

## 2015-06-17 MED ORDER — IBUPROFEN 600 MG PO TABS: 600.0000 mg | ORAL_TABLET | Freq: Four times a day (QID) | ORAL | Status: DC | PRN

## 2015-06-17 MED ORDER — HYDROCODONE-ACETAMINOPHEN 5-325 MG PO TABS: 1.0000 | ORAL_TABLET | Freq: Four times a day (QID) | ORAL | Status: DC | PRN

## 2015-06-17 NOTE — Discharge Instructions (Signed)
You have an infection of your tooth. Take amoxicillin twice a day for 10 days. Use the ibuprofen and hydrocodone as needed for pain. Please follow-up with your dentist as soon as possible.  I think the vomiting and diarrhea when unrelated stomach virus. It looks like the worst of it is over. Take Zofran every 8 hours as needed for nausea. Stick with bland foods for a day or 2 before you go back to your regular diet. Make sure you are drinking plenty of fluids.

## 2015-06-17 NOTE — ED Notes (Signed)
Onset early this morning of nausea, vomiting, and diarrhea

## 2015-06-17 NOTE — ED Provider Notes (Signed)
CSN: [id number]     Arrival date & time 06/17/15  1855 History   First MD Initiated Contact with Patient 06/17/15 2011     Chief Complaint  Patient presents with  . Emesis  . Diarrhea   (Consider location/radiation/quality/duration/timing/severity/associated sxs/prior Treatment) HPI She is a 70 year old woman here for evaluation of nausea, vomiting, and diarrhea. Her symptoms started suddenly last night after eating a stuffed chicken breast. She reports multiple episodes of nonbloody nonbilious emesis as well as multiple watery stools. She denies any blood in the stool. She states her stomach feels quivery, but denies pain. Last vomiting and diarrhea was this morning. She is continued to feel nauseated. She has not tried to eat or drink anything today. She has started feeling dizzy. This resolves when she lays down.  She also reports an abscess in her left upper gums for the last several days. She has not done anything for this yet.  Past Medical History  Diagnosis Date  . Osteopenia   . Palpitations   . GERD (gastroesophageal reflux disease)   . Colon polyps    Past Surgical History  Procedure Laterality Date  . Cesarean section     Family History  Problem Relation Age of Onset  . Hypertension Mother   . Osteoporosis Mother    Social History  Substance Use Topics  . Smoking status: Former Research scientist (life sciences)  . Smokeless tobacco: Former Systems developer    Quit date: 04/09/1978  . Alcohol Use: No   OB History    No data available     Review of Systems As in history of present illness Allergies  Review of patient's allergies indicates no known allergies.  Home Medications   Prior to Admission medications   Medication Sig Start Date End Date Taking? Authorizing Provider  amoxicillin (AMOXIL) 500 MG capsule Take 2 capsules (1,000 mg total) by mouth 2 (two) times daily. 06/17/15   Melony Overly, MD  esomeprazole (NEXIUM) 40 MG capsule Take 1 capsule (40 mg total) by mouth daily. 04/26/15   Susy Frizzle, MD  HYDROcodone-acetaminophen (NORCO) 5-325 MG tablet Take 1 tablet by mouth every 6 (six) hours as needed for moderate pain or severe pain. 06/17/15   Melony Overly, MD  ibuprofen (ADVIL,MOTRIN) 600 MG tablet Take 1 tablet (600 mg total) by mouth every 6 (six) hours as needed for moderate pain. 06/17/15   Melony Overly, MD  losartan (COZAAR) 100 MG tablet Take 1 tablet (100 mg total) by mouth daily. 04/26/15   Susy Frizzle, MD  ondansetron (ZOFRAN) 4 MG tablet Take 1 tablet (4 mg total) by mouth every 8 (eight) hours as needed for nausea or vomiting. 06/17/15   Melony Overly, MD   Meds Ordered and Administered this Visit   Medications  ondansetron (ZOFRAN-ODT) disintegrating tablet 4 mg (4 mg Oral Given 06/17/15 2038)    BP 133/78 mmHg  Pulse 116  Temp(Src) 99.8 F (37.7 C) (Oral)  Resp 20  SpO2 96% No data found.   Physical Exam  Constitutional: She is oriented to person, place, and time. She appears well-developed and well-nourished. No distress.  HENT:  Mouth/Throat:    Neck: Neck supple.  Cardiovascular: Regular rhythm and normal heart sounds.   No murmur heard. Mild tachycardia  Pulmonary/Chest: Effort normal and breath sounds normal. No respiratory distress. She has no wheezes. She has no rales.  Neurological: She is alert and oriented to person, place, and time.    ED Course  Procedures (  including critical care time)  Labs Review Labs Reviewed - No data to display  Imaging Review No results found.    MDM   1. Dental infection   2. Viral gastroenteritis    Nausea improved after Zofran. She is able to tolerate ginger ale prior to discharge. She is slightly dehydrated. Discussed importance of fluid intake. Prescription given for Zofran.  Treat dental abscess with amoxicillin, ibuprofen, and hydrocodone. Follow-up with dentist as soon as possible.    Melony Overly, MD 06/17/15 2059

## 2015-07-14 ENCOUNTER — Ambulatory Visit: Payer: Self-pay | Admitting: Family Medicine

## 2015-07-26 ENCOUNTER — Ambulatory Visit (INDEPENDENT_AMBULATORY_CARE_PROVIDER_SITE_OTHER): Payer: Medicare Other | Admitting: Family Medicine

## 2015-07-26 ENCOUNTER — Other Ambulatory Visit: Payer: Self-pay | Admitting: Family Medicine

## 2015-07-26 ENCOUNTER — Encounter: Payer: Self-pay | Admitting: Family Medicine

## 2015-07-26 VITALS — BP 136/68 | HR 70 | Temp 98.6°F | Resp 14 | Ht 65.0 in | Wt 189.0 lb

## 2015-07-26 DIAGNOSIS — F341 Dysthymic disorder: Secondary | ICD-10-CM | POA: Diagnosis not present

## 2015-07-26 DIAGNOSIS — E669 Obesity, unspecified: Secondary | ICD-10-CM | POA: Diagnosis not present

## 2015-07-26 DIAGNOSIS — E785 Hyperlipidemia, unspecified: Secondary | ICD-10-CM | POA: Diagnosis not present

## 2015-07-26 DIAGNOSIS — F5105 Insomnia due to other mental disorder: Secondary | ICD-10-CM | POA: Diagnosis not present

## 2015-07-26 DIAGNOSIS — Z0000 Encounter for general adult medical examination without abnormal findings: Secondary | ICD-10-CM | POA: Diagnosis not present

## 2015-07-26 DIAGNOSIS — F418 Other specified anxiety disorders: Secondary | ICD-10-CM | POA: Insufficient documentation

## 2015-07-26 DIAGNOSIS — I10 Essential (primary) hypertension: Secondary | ICD-10-CM | POA: Diagnosis not present

## 2015-07-26 LAB — CBC WITH DIFFERENTIAL/PLATELET
Basophils Absolute: 42 cells/uL (ref 0–200)
Basophils Relative: 1 %
EOS PCT: 6 %
Eosinophils Absolute: 252 cells/uL (ref 15–500)
HCT: 35.4 % (ref 35.0–45.0)
Hemoglobin: 10.9 g/dL — ABNORMAL LOW (ref 12.0–15.0)
LYMPHS PCT: 34 %
Lymphs Abs: 1428 cells/uL (ref 850–3900)
MCH: 24 pg — ABNORMAL LOW (ref 27.0–33.0)
MCHC: 30.8 g/dL — AB (ref 32.0–36.0)
MCV: 78 fL — ABNORMAL LOW (ref 80.0–100.0)
MONOS PCT: 13 %
MPV: 8.9 fL (ref 7.5–12.5)
Monocytes Absolute: 546 cells/uL (ref 200–950)
NEUTROS PCT: 46 %
Neutro Abs: 1932 cells/uL (ref 1500–7800)
Platelets: 382 10*3/uL (ref 140–400)
RBC: 4.54 MIL/uL (ref 3.80–5.10)
RDW: 15.7 % — AB (ref 11.0–15.0)
WBC: 4.2 10*3/uL (ref 3.8–10.8)

## 2015-07-26 LAB — COMPREHENSIVE METABOLIC PANEL
ALT: 17 U/L (ref 6–29)
AST: 19 U/L (ref 10–35)
Albumin: 3.9 g/dL (ref 3.6–5.1)
Alkaline Phosphatase: 112 U/L (ref 33–130)
BILIRUBIN TOTAL: 0.3 mg/dL (ref 0.2–1.2)
BUN: 13 mg/dL (ref 7–25)
CHLORIDE: 107 mmol/L (ref 98–110)
CO2: 27 mmol/L (ref 20–31)
CREATININE: 0.89 mg/dL (ref 0.50–0.99)
Calcium: 9 mg/dL (ref 8.6–10.4)
GLUCOSE: 97 mg/dL (ref 70–99)
Potassium: 4.1 mmol/L (ref 3.5–5.3)
SODIUM: 144 mmol/L (ref 135–146)
Total Protein: 7.1 g/dL (ref 6.1–8.1)

## 2015-07-26 LAB — LIPID PANEL
Cholesterol: 173 mg/dL (ref 125–200)
HDL: 35 mg/dL — AB (ref >=46)
LDL CALC: 110 mg/dL (ref <130)
Total CHOL/HDL Ratio: 4.9 Ratio (ref <=5.0)
Triglycerides: 141 mg/dL (ref <150)
VLDL: 28 mg/dL (ref <30)

## 2015-07-26 MED ORDER — TRAZODONE HCL 50 MG PO TABS: ORAL_TABLET | ORAL | Status: DC

## 2015-07-26 MED ORDER — ZOSTER VACCINE LIVE 19400 UNT/0.65ML ~~LOC~~ SOLR: 0.6500 mL | Freq: Once | SUBCUTANEOUS | Status: DC

## 2015-07-26 NOTE — Assessment & Plan Note (Signed)
Recheck cholesterol levels.

## 2015-07-26 NOTE — Patient Instructions (Addendum)
Replens for Women  Continue current medications Call for Mammogram Shingle vaccine sent to pharmacy  For your bowels- Restora or Align Probiotics  Try the trazodone F/U 6 months

## 2015-07-26 NOTE — Assessment & Plan Note (Signed)
We'll try her on trazodone 25-50 mg at bedtime. Most of her stress seems to be worried over her grandchild and her daughter who are going to a lot of difficulties. She's had chronic insomnia for quite some time.

## 2015-07-26 NOTE — Progress Notes (Signed)
Patient ID: Nina Olson, female   DOB: 09/25/1945, 70 y.o.   MRN: [id number]  Subjective:   Patient presents for Medicare Annual/Subsequent preventive examination.   Patient for annual wellness exam. She's been concerned about her sleep she does not sleep very well. She does get stressed out about her daughter who currently has bipolar depression she has a son who is 13 years been acting out and acetaminophen to juvenile court. She states that she worries and stresses over them all the time that she knows there is not much she can do to help intervene.  She also had a bout of bursitis which she has had in the past this time in her left arm she is a topical anti-inflammatory now his hip to move her arm around normally. The pain is also resolved. She was to see she can start working out.  She was seen by gastroenterology last year her colonoscopy is up to date she has had multiple polyps removed in the past. She does occasionally get some GI upset occasion she will have diarrhea of the time she just feels bloated. She was seen for gastroenteritis a couple months ago.  She asked about something should use for vaginal dryness. She's never had any abnormal Pap smears she does get some discomfort with sexual activity. Review Past Medical/Family/Social: Per EMR    Risk Factors  Current exercise habits: walks Dietary issues discussed: yes  Cardiac risk factors: Obesity (BMI >= 30 kg/m2). HTN  Depression Screen - SEE PHQ-9  (Note: if answer to either of the following is "Yes", a more complete depression screening is indicated)  Over the past two weeks, have you felt down, depressed or hopeless? YES Over the past two weeks, have you felt little interest or pleasure in doing things? Tes Have you lost interest or pleasure in daily life? No Do you often feel hopeless? Yes Do you cry easily over simple problems? No   Activities of Daily Living  In your present state of health, do you have any  difficulty performing the following activities?:  Driving? No  Managing money? No  Feeding yourself? No  Getting from bed to chair? No  Climbing a flight of stairs? No  Preparing food and eating?: No  Bathing or showering? No  Getting dressed: No  Getting to the toilet? No  Using the toilet:No  Moving around from place to place: No  In the past year have you fallen or had a near fall?:No  Are you sexually active? yES Do you have more than one partner? No   Hearing Difficulties: No  Do you often ask people to speak up or repeat themselves? No  Do you experience ringing or noises in your ears? No Do you have difficulty understanding soft or whispered voices? No  Do you feel that you have a problem with memory? No Do you often misplace items? No  Do you feel safe at home? Yes  Cognitive Testing  Alert? Yes Normal Appearance?Yes  Oriented to person? Yes Place? Yes  Time? Yes  Recall of three objects? Yes  Can perform simple calculations? Yes  Displays appropriate judgment?Yes  Can read the correct time from a watch face?Yes   List the Names of Other Physician/Practitioners you currently use:  Dr. Collene Mares    Screening Tests / Date Colonoscopy         UTD FEb 2016- had Tubular Adenoma           Zostavax - not covered  Mammogram- UTD  Influenza Vaccine Due in 2017 Tetanus/tdap UTD  Bone Density- Normal in 03/2014  ROS: GEN- denies fatigue, fever, weight loss,weakness, recent illness HEENT- denies eye drainage, change in vision, nasal discharge, CVS- denies chest pain, palpitations RESP- denies SOB, cough, wheeze ABD- denies N/V, change in stools, abd pain GU- denies dysuria, hematuria, dribbling, incontinence MSK- denies joint pain, muscle aches, injury Neuro- denies headache, dizziness, syncope, seizure activity  Physical: GEN- NAD, alert and oriented x3 HEENT- PERRL, EOMI, non injected sclera, pink conjunctiva, MMM, oropharynx clear Neck- Supple, no thryomegaly CVS-  RRR, no murmur RESP-CTAB ABD-NABS,soft,NT,ND  Psych- normal affect and mood MSK- GOOD ROM upper and LE, normal inspection LUE, biceps in tact, neg Empty can  EXT- No edema Pulses- Radial, DP- 2+    Assessment:    Annual wellness medicare exam   Plan:    During the course of the visit the patient was educated and counseled about appropriate screening and preventive services including:  Bone Density can be done at earliest Dec 2017, last one normal  Shingles vaccine. Prescription given to that she can get the vaccine at the pharmacy or Medicare part D.  Screen + for depression. PHQ- 9 score of 12 (moderate depression).   She can try Replens for vaginal dryness   Okay to start working out .    Diet review for nutrition referral? Yes ____ Not Indicated __x__  Patient Instructions (the written plan) was given to the patient.  Medicare Attestation  I have personally reviewed:  The patient's medical and social history  Their use of alcohol, tobacco or illicit drugs  Their current medications and supplements  The patient's functional ability including ADLs,fall risks, home safety risks, cognitive, and hearing and visual impairment  Diet and physical activities  Evidence for depression or mood disorders  The patient's weight, height, BMI, and visual acuity have been recorded in the chart. I have made referrals, counseling, and provided education to the patient based on review of the above and I have provided the patient with a written personalized care plan for preventive services.

## 2015-07-26 NOTE — Assessment & Plan Note (Signed)
Blood pressure is controlled on medication medication

## 2015-07-27 ENCOUNTER — Other Ambulatory Visit: Payer: Self-pay | Admitting: *Deleted

## 2015-07-27 DIAGNOSIS — D649 Anemia, unspecified: Secondary | ICD-10-CM

## 2015-07-28 LAB — IRON AND TIBC
%SAT: 6 % — ABNORMAL LOW (ref 11–50)
IRON: 27 ug/dL — AB (ref 45–160)
TIBC: 438 ug/dL (ref 250–450)
UIBC: 411 ug/dL — ABNORMAL HIGH (ref 125–400)

## 2015-07-28 LAB — FERRITIN: FERRITIN: 10 ng/mL — AB (ref 20–288)

## 2015-07-29 ENCOUNTER — Other Ambulatory Visit: Payer: Self-pay | Admitting: *Deleted

## 2015-07-29 ENCOUNTER — Other Ambulatory Visit: Payer: Self-pay

## 2015-07-29 DIAGNOSIS — Z1231 Encounter for screening mammogram for malignant neoplasm of breast: Secondary | ICD-10-CM

## 2015-07-29 DIAGNOSIS — D509 Iron deficiency anemia, unspecified: Secondary | ICD-10-CM

## 2015-07-29 MED ORDER — FERROUS SULFATE 325 (65 FE) MG PO TABS: 325.0000 mg | ORAL_TABLET | Freq: Two times a day (BID) | ORAL | Status: DC

## 2015-07-29 MED ORDER — VITAMIN C 500 MG PO TABS: 500.0000 mg | ORAL_TABLET | Freq: Every day | ORAL | Status: DC

## 2015-08-02 ENCOUNTER — Other Ambulatory Visit: Payer: Medicare Other

## 2015-08-02 DIAGNOSIS — D649 Anemia, unspecified: Secondary | ICD-10-CM

## 2015-08-03 ENCOUNTER — Encounter: Payer: Self-pay | Admitting: *Deleted

## 2015-08-03 LAB — FECAL OCCULT BLOOD, IMMUNOCHEMICAL: FECAL OCCULT BLOOD: POSITIVE — AB

## 2015-08-12 ENCOUNTER — Encounter: Payer: Medicare Other | Admitting: Family Medicine

## 2015-08-15 ENCOUNTER — Ambulatory Visit
Admission: RE | Admit: 2015-08-15 | Discharge: 2015-08-15 | Disposition: A | Discharge status: Home / Self Care | Payer: Medicare Other | Source: Ambulatory Visit

## 2015-08-15 DIAGNOSIS — Z1231 Encounter for screening mammogram for malignant neoplasm of breast: Secondary | ICD-10-CM

## 2015-08-16 ENCOUNTER — Other Ambulatory Visit: Payer: Medicare Other

## 2015-08-16 DIAGNOSIS — D509 Iron deficiency anemia, unspecified: Secondary | ICD-10-CM

## 2015-08-16 LAB — CBC WITH DIFFERENTIAL/PLATELET
BASOS ABS: 48 {cells}/uL (ref 0–200)
BASOS PCT: 1 %
EOS ABS: 240 {cells}/uL (ref 15–500)
Eosinophils Relative: 5 %
HCT: 37.2 % (ref 35.0–45.0)
Hemoglobin: 11.7 g/dL — ABNORMAL LOW (ref 12.0–15.0)
LYMPHS PCT: 38 %
Lymphs Abs: 1824 cells/uL (ref 850–3900)
MCH: 23.9 pg — AB (ref 27.0–33.0)
MCHC: 31.5 g/dL — ABNORMAL LOW (ref 32.0–36.0)
MCV: 75.9 fL — AB (ref 80.0–100.0)
MONOS PCT: 11 %
MPV: 9.6 fL (ref 7.5–12.5)
Monocytes Absolute: 528 cells/uL (ref 200–950)
Neutro Abs: 2160 cells/uL (ref 1500–7800)
Neutrophils Relative %: 45 %
PLATELETS: 289 10*3/uL (ref 140–400)
RBC: 4.9 MIL/uL (ref 3.80–5.10)
RDW: 17.7 % — AB (ref 11.0–15.0)
WBC: 4.8 10*3/uL (ref 3.8–10.8)

## 2015-08-16 LAB — IRON: Iron: 37 ug/dL — ABNORMAL LOW (ref 45–160)

## 2015-08-18 ENCOUNTER — Telehealth: Payer: Self-pay | Admitting: *Deleted

## 2015-08-18 NOTE — Telephone Encounter (Signed)
Received call from patient requesting results of stool cards.   Advised that only one card was completed and resulted.   Advised that the one stool card is positive for blood.   Ok to refer to GI for colonoscopy?

## 2015-08-19 NOTE — Telephone Encounter (Signed)
Call placed to patient and patient made aware.  

## 2015-08-19 NOTE — Telephone Encounter (Signed)
She has had colonoscopy with Dr. Collene Mares, okay to schedule a visit for follow up with her, continue iron tabs

## 2015-09-07 ENCOUNTER — Ambulatory Visit (INDEPENDENT_AMBULATORY_CARE_PROVIDER_SITE_OTHER): Payer: Medicare Other | Admitting: Family Medicine

## 2015-09-07 ENCOUNTER — Encounter: Payer: Self-pay | Admitting: Family Medicine

## 2015-09-07 VITALS — BP 132/78 | HR 82 | Temp 98.4°F | Resp 12 | Ht 66.0 in | Wt 184.0 lb

## 2015-09-07 DIAGNOSIS — A6920 Lyme disease, unspecified: Secondary | ICD-10-CM | POA: Diagnosis not present

## 2015-09-07 LAB — CBC WITH DIFFERENTIAL/PLATELET
BASOS ABS: 55 {cells}/uL (ref 0–200)
Basophils Relative: 1 %
EOS ABS: 110 {cells}/uL (ref 15–500)
Eosinophils Relative: 2 %
HCT: 38.7 % (ref 35.0–45.0)
HEMOGLOBIN: 12.4 g/dL (ref 12.0–15.0)
LYMPHS ABS: 1430 {cells}/uL (ref 850–3900)
Lymphocytes Relative: 26 %
MCH: 24.5 pg — AB (ref 27.0–33.0)
MCHC: 32 g/dL (ref 32.0–36.0)
MCV: 76.5 fL — AB (ref 80.0–100.0)
MONOS PCT: 12 %
MPV: 9.8 fL (ref 7.5–12.5)
Monocytes Absolute: 660 cells/uL (ref 200–950)
NEUTROS ABS: 3245 {cells}/uL (ref 1500–7800)
NEUTROS PCT: 59 %
Platelets: 274 10*3/uL (ref 140–400)
RBC: 5.06 MIL/uL (ref 3.80–5.10)
RDW: 19.8 % — AB (ref 11.0–15.0)
WBC: 5.5 10*3/uL (ref 3.8–10.8)

## 2015-09-07 MED ORDER — DOXYCYCLINE HYCLATE 100 MG PO TABS: 100.0000 mg | ORAL_TABLET | Freq: Two times a day (BID) | ORAL | Status: DC

## 2015-09-07 NOTE — Progress Notes (Signed)
Patient ID: Nina Olson, female   DOB: [birth year], 70 y.o.   MRN: [id number]   Subjective:    Patient ID: Nina Olson, female    DOB: 16-Aug-1945, 70 y.o.   MRN: [id number]  Patient presents for Tick Bite Patient here with tick bite to her right thigh. She's actually had 2 other tick bites but did not have any reaction. She pulled off a tick on her right thigh about 2 weeks ago she said it which is a small red bump more like a mosquito and was itchy she did not notice any changes with the lesion for about a week and then she noticed a red circle developed.  It then progressed and is now raised and even more red and larger today. She had a low-grade fever a few days ago she also noted some neck stiffness and headache but she is not sure if that was just because she was doing too much. She denies any joint pain or headache today.    Review Of Systems:  GEN- denies fatigue, fever, weight loss,weakness, recent illness HEENT- denies eye drainage, change in vision, nasal discharge, CVS- denies chest pain, palpitations RESP- denies SOB, cough, wheeze ABD- denies N/V, change in stools, abd pain GU- denies dysuria, hematuria, dribbling, incontinence MSK- denies joint pain, muscle aches, injury Neuro- denies headache, dizziness, syncope, seizure activity       Objective:    BP 132/78 mmHg  Pulse 82  Temp(Src) 98.4 F (36.9 C) (Oral)  Resp 12  Ht 5\' 6"  (1.676 m)  Wt 184 lb (83.462 kg)  BMI 29.71 kg/m2 GEN- NAD, alert and oriented x3 HEENT- PERRL, EOMI, non injected sclera, pink conjunctiva, MMM, oropharynx clear Neck- Supple, FROM, no LAD  Skin - Right thigh 3.5x3 " area of erythema mild central clearing, induration beneath  MSK- Right knee- no effusion, normal inspection FROM  Pulses- Radial, DP- 2+        Assessment & Plan:      Problem List Items Addressed This Visit    None    Visit Diagnoses    Lyme disease    -  Primary    Concern for early Lyme disease, start doxy,  obtain labs. given sun precaution    Relevant Medications    doxycycline (VIBRA-TABS) 100 MG tablet    Other Relevant Orders    Sedimentation Rate    CBC with Differential/Platelet    B. Burgdorfi Antibodies       Note: This dictation was prepared with Dragon dictation along with smaller phrase technology. Any transcriptional errors that result from this process are unintentional.

## 2015-09-07 NOTE — Patient Instructions (Signed)
Take antibiotics as prescribed We will call with lab results F/U as needed

## 2015-09-08 ENCOUNTER — Ambulatory Visit: Payer: Medicare Other | Admitting: Physician Assistant

## 2015-09-08 LAB — LYME AB/WESTERN BLOT REFLEX: B burgdorferi Ab IgG+IgM: <0.9 Index (ref <0.90)

## 2015-09-08 LAB — SEDIMENTATION RATE: SED RATE: 10 mm/h (ref 0–30)

## 2015-09-19 ENCOUNTER — Telehealth: Payer: Self-pay | Admitting: Family Medicine

## 2015-09-19 NOTE — Telephone Encounter (Signed)
Patient  Calling to say that we told her she needed an appointment with dr Collene Mares, she dosen't understand why, because she had colonscopy last year  Please call her at [phone]

## 2015-09-19 NOTE — Telephone Encounter (Signed)
Call placed to patient. LMTRC.  

## 2015-09-19 NOTE — Telephone Encounter (Signed)
Her labs showed blood in the stools with her iron def anemia This is why f/u with Dr. Collene Mares was recommended

## 2015-09-20 NOTE — Telephone Encounter (Signed)
Call placed to patient. LMTRC.  

## 2015-09-21 NOTE — Telephone Encounter (Signed)
Multiple calls placed to patient with no answer and no return call.   Message to be closed.  

## 2015-11-08 ENCOUNTER — Other Ambulatory Visit: Payer: Self-pay | Admitting: Family Medicine

## 2015-12-27 IMAGING — CR DG SHOULDER 2+V*L*
3 series · 3 of 3 positions shown · non-contrast
Comparison: None.

CLINICAL DATA: Posterior neck pain and left arm pain.

EXAM:
LEFT SHOULDER - 2+ VIEW

[w shoulder ap internal left]
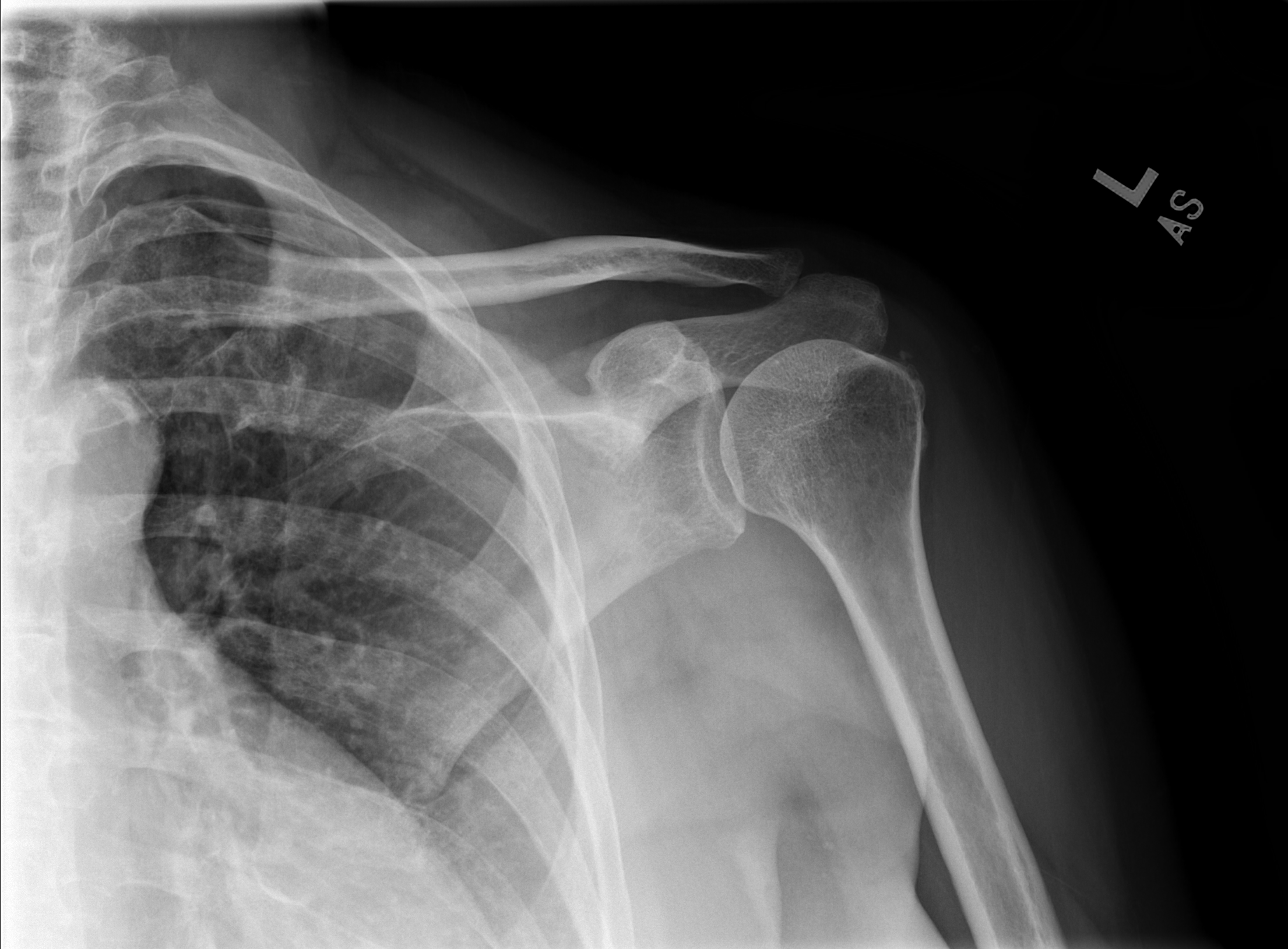

[w shoulder y view left]
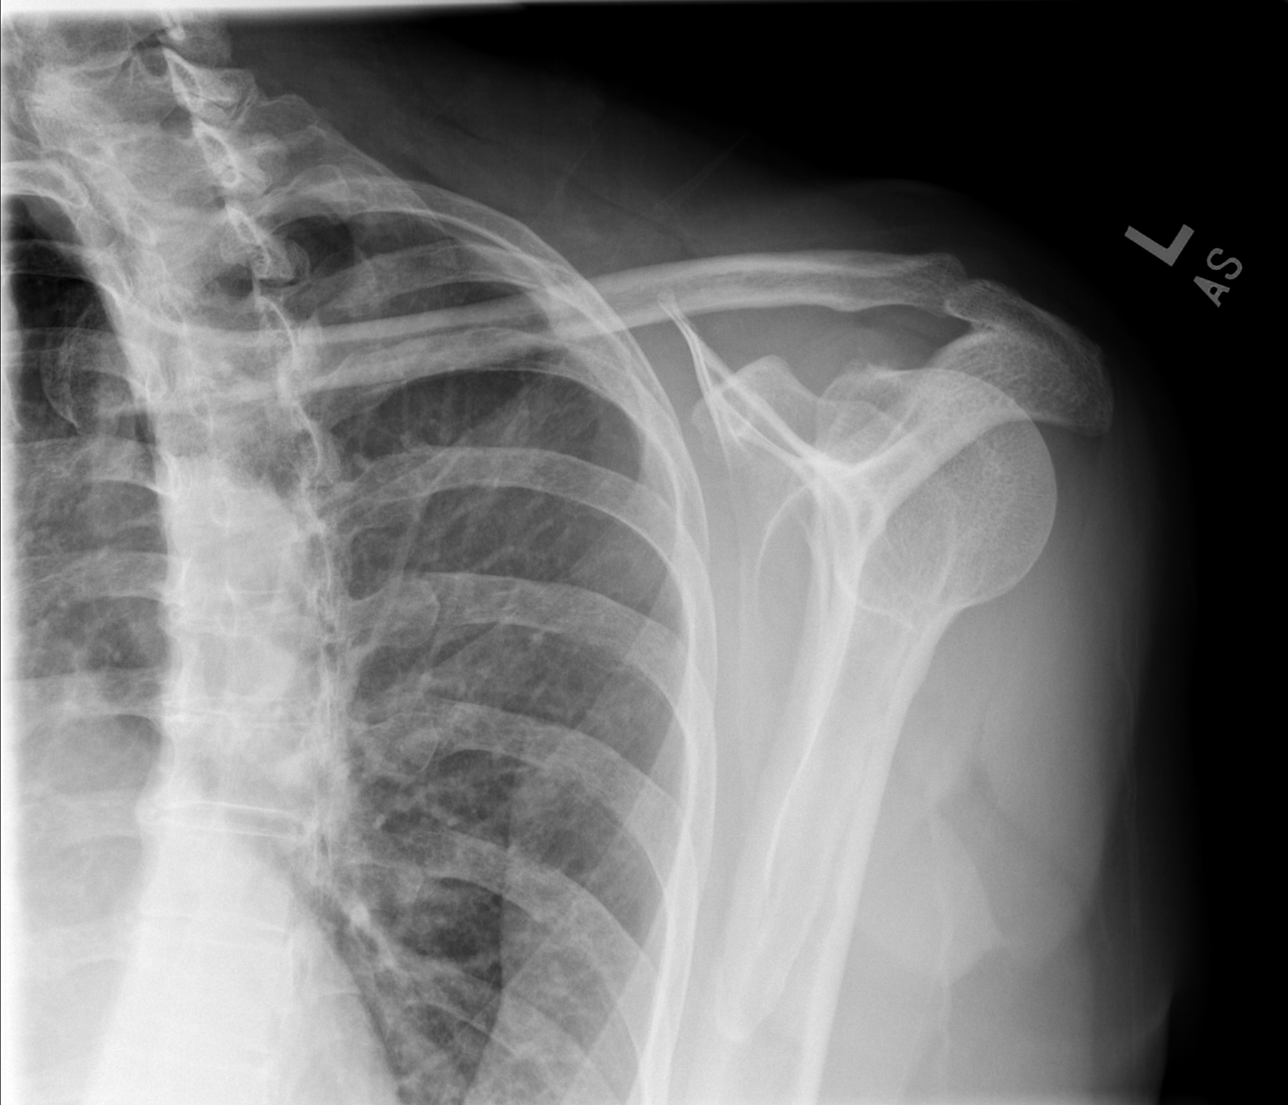

[w shoulder axillary left *]
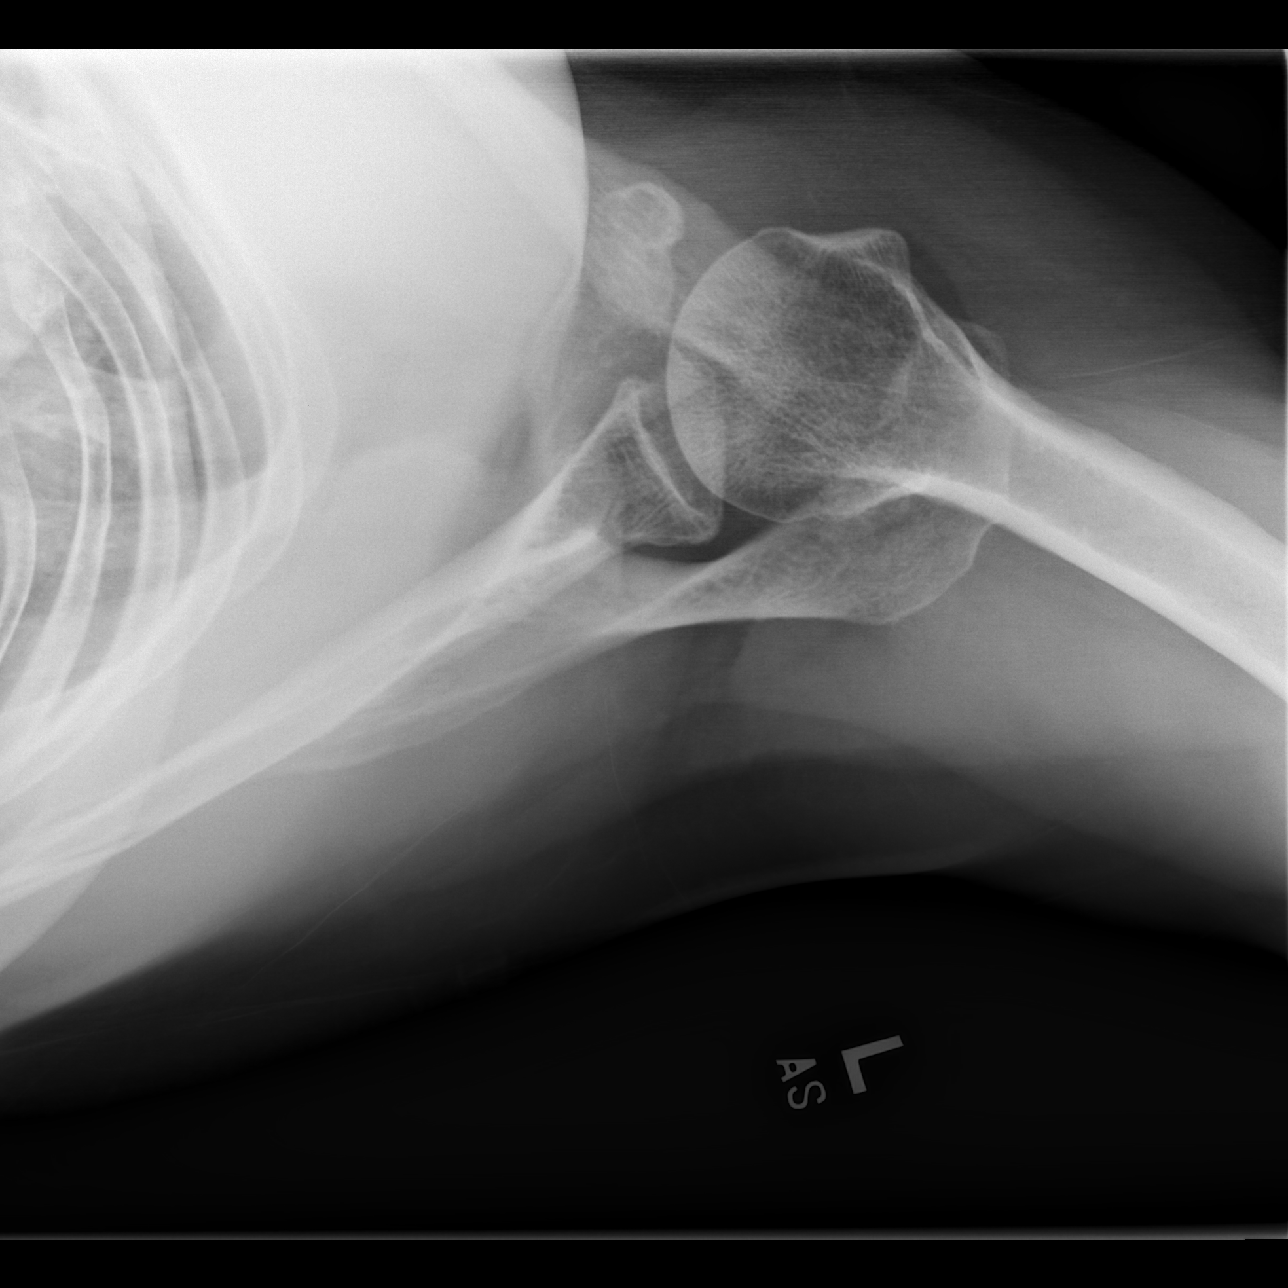

[3 of 3 positions shown; findings below may reference images not displayed]

FINDINGS: No acute bony or joint abnormality identified. No evidence of
fracture or dislocation. Supraspinatus calcific density noted
consistent with calcific supraspinatus tendinitis.
IMPRESSION: 1. No acute abnormality.
2. Calcification in the region supraspinatus tendon insertion
consistent with calcific supraspinatus tendinitis.

## 2016-02-10 ENCOUNTER — Encounter: Payer: Self-pay | Admitting: Family Medicine

## 2016-02-10 ENCOUNTER — Ambulatory Visit (INDEPENDENT_AMBULATORY_CARE_PROVIDER_SITE_OTHER): Payer: Medicare Other | Admitting: Family Medicine

## 2016-02-10 VITALS — BP 138/80 | HR 88 | Temp 98.2°F | Resp 14 | Ht 66.0 in | Wt 188.0 lb

## 2016-02-10 DIAGNOSIS — I10 Essential (primary) hypertension: Secondary | ICD-10-CM | POA: Diagnosis not present

## 2016-02-10 DIAGNOSIS — K219 Gastro-esophageal reflux disease without esophagitis: Secondary | ICD-10-CM | POA: Insufficient documentation

## 2016-02-10 DIAGNOSIS — M25551 Pain in right hip: Secondary | ICD-10-CM | POA: Diagnosis not present

## 2016-02-10 DIAGNOSIS — Z23 Encounter for immunization: Secondary | ICD-10-CM

## 2016-02-10 DIAGNOSIS — R002 Palpitations: Secondary | ICD-10-CM

## 2016-02-10 DIAGNOSIS — R1013 Epigastric pain: Secondary | ICD-10-CM | POA: Diagnosis not present

## 2016-02-10 DIAGNOSIS — H6121 Impacted cerumen, right ear: Secondary | ICD-10-CM | POA: Diagnosis not present

## 2016-02-10 LAB — CBC WITH DIFFERENTIAL/PLATELET
BASOS ABS: 42 {cells}/uL (ref 0–200)
Basophils Relative: 1 %
EOS ABS: 210 {cells}/uL (ref 15–500)
EOS PCT: 5 %
HCT: 39.4 % (ref 35.0–45.0)
Hemoglobin: 12.7 g/dL (ref 12.0–15.0)
Lymphocytes Relative: 35 %
Lymphs Abs: 1470 cells/uL (ref 850–3900)
MCH: 26 pg — AB (ref 27.0–33.0)
MCHC: 32.2 g/dL (ref 32.0–36.0)
MCV: 80.7 fL (ref 80.0–100.0)
MONOS PCT: 14 %
MPV: 9.3 fL (ref 7.5–12.5)
Monocytes Absolute: 588 cells/uL (ref 200–950)
NEUTROS ABS: 1890 {cells}/uL (ref 1500–7800)
NEUTROS PCT: 45 %
PLATELETS: 294 10*3/uL (ref 140–400)
RBC: 4.88 MIL/uL (ref 3.80–5.10)
RDW: 16.5 % — ABNORMAL HIGH (ref 11.0–15.0)
WBC: 4.2 10*3/uL (ref 3.8–10.8)

## 2016-02-10 NOTE — Patient Instructions (Addendum)
Get the xrays done at [place] We will call with lab results Stop the Excedrin Take tylenol 500mg  twice a day as needed for pain Take nexium twice a day , also take a probiotic for gas/bloating  F/U pending results

## 2016-02-10 NOTE — Progress Notes (Signed)
Subjective:    Patient ID: Nina Olson, female    DOB: 1945-06-05, 70 y.o.   MRN: [id number]  Patient presents for ABD Pain (reports that she has frequent abd pain and gas- reports a lot of burping); R Leg Pain (states that she has "bone" ache in R leg); and Dizziness (frequent episode of dizziness when moving around- ortho bp WNL- reports increased wax to R ear as well)  Abdominal pain more like a nervous stomach in epigastric region off and on for a few months , improves some when she eats, but gets bloated and gassy and belch a lot  Taking nexium for GERD which helps the heartburn sensation Bowels moving regulary No particular foods make it worse , but has decreased meats and milk which has helped  She does get more reflux with greasy foods Occasionally gets palpitations denies chest pain, SOB, diaphoresis, N/V    Right leg pain- has aching from hip down and into buttocks , no pain with walking, worse at night when laying down, during day has pain in great toes , occasional very mild  tingling and numbness in hands occasionally in right foot 4th and 5th toes   Right ear feels clogged up, has occaisional dizziness   Review Of Systems:  GEN- denies fatigue, fever, weight loss,weakness, recent illness HEENT- denies eye drainage, change in vision, nasal discharge, CVS- denies chest pain,+ palpitations RESP- denies SOB, cough, wheeze ABD- denies N/V, change in stools, abd pain GU- denies dysuria, hematuria, dribbling, incontinence MSK- + joint pain, muscle aches, injury Neuro- denies headache, dizziness, syncope, seizure activity       Objective:    BP 138/80 (BP Location: Right Arm, Patient Position: Sitting, Cuff Size: Large)   Pulse 88   Temp 98.2 F (36.8 C) (Oral)   Resp 14   Ht 5\' 6"  (1.676 m)   Wt 188 lb (85.3 kg)   SpO2 96%   BMI 30.34 kg/m  GEN- NAD, alert and oriented x3 HEENT- PERRL, EOMI, non injected sclera, pink conjunctiva, TM obscurred by wax MMM,  oropharynx clear Neck- Supple, no thyromegaly CVS- RRR, no murmur RESP-CTAB ABD-NABS,soft,NT,ND MSK- Spine NT, fair ROM, neg SLR, fair ROM Hips, some pain witH IR, bilat knees, no effusion EXT- No edema Neuro-CNII-XII intact, no deficits  Pulses- Radial, DP- 2+   EKG- NSR, no ST changes Orthostatics normal      Assessment & Plan:      Problem List Items Addressed This Visit    Palpitations    intermittant but also coincides with the GI symptoms EKG normal, compared to previous May need cardiac monitor if this continues after GI symptoms resolved , no chest pain      Relevant Orders   EKG 12-Lead (Completed)   GERD (gastroesophageal reflux disease)    Increase nexium to BID dosing       Essential hypertension - Primary    Controlled, no changes       Relevant Orders   EKG 12-Lead (Completed)    Other Visit Diagnoses    Pain of right hip joint       I think symptom may come from 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, since radiating to buttocks, check xray l spine and hip   Relevant Orders   DG Lumbar Spine Complete   DG HIPS BILAT W OR W/O PELVIS 3-4 VIEWS   Abdominal pain, epigastric       Relevant Orders   CBC with Differential/Platelet (Completed)  Comprehensive metabolic panel (Completed)   Lipase (Completed)   Right ear impacted cerumen       s/p irrigation improved   Need for prophylactic vaccination and inoculation against influenza       Relevant Orders   Flu Vaccine QUAD 36+ mos PF IM (Fluarix & Fluzone Quad PF) (Completed)      Note: This dictation was prepared with Dragon dictation along with smaller phrase technology. Any transcriptional errors that result from this process are unintentional.

## 2016-02-10 NOTE — Progress Notes (Signed)
Orthostatic Blood Pressure:  Lying: 140/78  Sitting: 138/80  Standing: 142/80

## 2016-02-11 ENCOUNTER — Encounter: Payer: Self-pay | Admitting: Family Medicine

## 2016-02-11 LAB — COMPREHENSIVE METABOLIC PANEL
ALBUMIN: 4.1 g/dL (ref 3.6–5.1)
ALT: 18 U/L (ref 6–29)
AST: 18 U/L (ref 10–35)
Alkaline Phosphatase: 112 U/L (ref 33–130)
BUN: 11 mg/dL (ref 7–25)
CHLORIDE: 103 mmol/L (ref 98–110)
CO2: 24 mmol/L (ref 20–31)
Calcium: 9.5 mg/dL (ref 8.6–10.4)
Creat: 0.84 mg/dL (ref 0.50–0.99)
Glucose, Bld: 78 mg/dL (ref 70–99)
POTASSIUM: 4.3 mmol/L (ref 3.5–5.3)
SODIUM: 140 mmol/L (ref 135–146)
TOTAL PROTEIN: 7.5 g/dL (ref 6.1–8.1)
Total Bilirubin: 0.4 mg/dL (ref 0.2–1.2)

## 2016-02-11 LAB — LIPASE: LIPASE: 41 U/L (ref 7–60)

## 2016-02-11 NOTE — Assessment & Plan Note (Signed)
Increase nexium to BID dosing

## 2016-02-11 NOTE — Assessment & Plan Note (Signed)
intermittant but also coincides with the GI symptoms EKG normal, compared to previous May need cardiac monitor if this continues after GI symptoms resolved , no chest pain

## 2016-02-11 NOTE — Assessment & Plan Note (Signed)
Controlled, no changes. 

## 2016-04-17 ENCOUNTER — Telehealth: Payer: Self-pay | Admitting: *Deleted

## 2016-04-17 NOTE — Telephone Encounter (Signed)
Received call from patient.   Reports that she has not had x-rays of hip performed yet, but she is going to try to go this week.   States that she also wants x-ray of ankle D/T pain. Advised that she would need to be seen. Patient reports that she will go to her podiatrist.

## 2016-09-13 ENCOUNTER — Other Ambulatory Visit: Payer: Self-pay | Admitting: Family Medicine

## 2016-09-13 DIAGNOSIS — I10 Essential (primary) hypertension: Secondary | ICD-10-CM

## 2016-11-12 ENCOUNTER — Encounter: Payer: Self-pay | Admitting: Family Medicine

## 2016-11-12 ENCOUNTER — Ambulatory Visit (INDEPENDENT_AMBULATORY_CARE_PROVIDER_SITE_OTHER): Payer: Medicare Other | Admitting: Family Medicine

## 2016-11-12 VITALS — BP 134/80 | HR 86 | Temp 98.8°F | Resp 14 | Ht 66.0 in | Wt 186.0 lb

## 2016-11-12 DIAGNOSIS — M5412 Radiculopathy, cervical region: Secondary | ICD-10-CM

## 2016-11-12 MED ORDER — PREDNISONE 20 MG PO TABS: ORAL_TABLET | ORAL | 0 refills | Status: DC

## 2016-11-12 NOTE — Progress Notes (Signed)
Subjective:    Patient ID: Nina Olson, female    DOB: 1946/04/09, 71 y.o.   MRN: [id number]  HPI Over the last week, the patient reports pain radiating from the left side of her neck into her left shoulder down her left arm into her left hand. She also reports subjective weakness in the left upper extremity as well as subjective numbness in the left upper extremity. There is no other neurologic deficits. She denies any injury or falls. She denies any neck pain. She does have a positive Spurling's maneuver. Muscle strength is 5 over 5 equal and symmetric in the upper extremities. She has normal sensation to temperature, and vibration. She has normal grip strength. She has normal reflexes at the biceps and brachioradialis. However her symptoms are concerning for cervical radiculopathy as a source of her pain. She also complains of red eyes 1 month. She denies any eye pain. She denies any blurry vision. The conjunctiva of both eyes are erythematous with perilimbal sparing. Pupils are equal round reactive to light. She has an ophthalmologist but she has not seen them in the last 4 months. Past Medical History:  Diagnosis Date  . Colon polyps   . GERD (gastroesophageal reflux disease)   . Osteopenia   . Palpitations    Past Surgical History:  Procedure Laterality Date  . CESAREAN SECTION     Current Outpatient Prescriptions on File Prior to Visit  Medication Sig Dispense Refill  . esomeprazole (NEXIUM) 40 MG capsule TAKE ONE CAPSULE BY MOUTH ONCE DAILY 30 capsule 11  . ferrous sulfate 325 (65 FE) MG tablet Take 1 tablet (325 mg total) by mouth 2 (two) times daily with a meal. (Patient taking differently: Take 325 mg by mouth daily with breakfast. )  3  . ibuprofen (ADVIL,MOTRIN) 600 MG tablet Take 1 tablet (600 mg total) by mouth every 6 (six) hours as needed for moderate pain. 30 tablet 0  . losartan (COZAAR) 100 MG tablet TAKE ONE TABLET BY MOUTH ONCE DAILY 90 tablet 3  . traZODone  (DESYREL) 50 MG tablet Take 1/2 to 1 tablet at bedtime as needed for sleep 30 tablet 3  . vitamin C (ASCORBIC ACID) 500 MG tablet Take 1 tablet (500 mg total) by mouth daily.     No current facility-administered medications on file prior to visit.    No Known Allergies Social History   Social History  . Marital status: Married    Spouse name: N/A  . Number of children: N/A  . Years of education: N/A   Occupational History  . Not on file.   Social History Main Topics  . Smoking status: Former Research scientist (life sciences)  . Smokeless tobacco: Former Systems developer    Quit date: 04/09/1978  . Alcohol use No  . Drug use: No  . Sexual activity: No   Other Topics Concern  . Not on file   Social History Narrative  . No narrative on file      Review of Systems  All other systems reviewed and are negative.      Objective:   Physical Exam  Constitutional: She is oriented to person, place, and time.  Eyes: Pupils are equal, round, and reactive to light. EOM and lids are normal. Right eye exhibits no discharge and no exudate. Left eye exhibits no discharge and no exudate. Right conjunctiva is injected. Left conjunctiva is injected.    Cardiovascular: Normal rate, regular rhythm and normal heart sounds.   Pulmonary/Chest: Effort normal and  breath sounds normal.  Neurological: She is alert and oriented to person, place, and time. She has normal reflexes. She displays normal reflexes. No cranial nerve deficit. She exhibits normal muscle tone. Coordination normal.          Assessment & Plan:  Cervical radiculopathy  The patient's left arm pain sounds like cervical radiculopathy. I would recommend a prednisone taper pack to treat this. However I am not sure of the cause of her chronic red eye. Therefore I recommended that she see her ophthalmologist soon as possible to determine what is causing the redness in her eyes. I question possible chronic dry eye versus uveitis. Patient has a complete physical exam  scheduled for next week. If the patient's left arm pain is not improving at that time, I will proceed with x-rays of the cervical spine to evaluate further

## 2016-11-21 ENCOUNTER — Ambulatory Visit (INDEPENDENT_AMBULATORY_CARE_PROVIDER_SITE_OTHER): Payer: Medicare Other | Admitting: Family Medicine

## 2016-11-21 ENCOUNTER — Encounter: Payer: Self-pay | Admitting: Family Medicine

## 2016-11-21 VITALS — BP 136/76 | HR 80 | Temp 98.2°F | Resp 14 | Ht 66.0 in | Wt 183.0 lb

## 2016-11-21 DIAGNOSIS — F418 Other specified anxiety disorders: Secondary | ICD-10-CM | POA: Diagnosis not present

## 2016-11-21 DIAGNOSIS — M8589 Other specified disorders of bone density and structure, multiple sites: Secondary | ICD-10-CM

## 2016-11-21 DIAGNOSIS — E782 Mixed hyperlipidemia: Secondary | ICD-10-CM

## 2016-11-21 DIAGNOSIS — Z1159 Encounter for screening for other viral diseases: Secondary | ICD-10-CM

## 2016-11-21 DIAGNOSIS — Z0000 Encounter for general adult medical examination without abnormal findings: Secondary | ICD-10-CM | POA: Diagnosis not present

## 2016-11-21 DIAGNOSIS — H6121 Impacted cerumen, right ear: Secondary | ICD-10-CM

## 2016-11-21 DIAGNOSIS — I10 Essential (primary) hypertension: Secondary | ICD-10-CM | POA: Diagnosis not present

## 2016-11-21 DIAGNOSIS — M5030 Other cervical disc degeneration, unspecified cervical region: Secondary | ICD-10-CM

## 2016-11-21 DIAGNOSIS — K219 Gastro-esophageal reflux disease without esophagitis: Secondary | ICD-10-CM

## 2016-11-21 DIAGNOSIS — F5105 Insomnia due to other mental disorder: Secondary | ICD-10-CM

## 2016-11-21 LAB — CBC WITH DIFFERENTIAL/PLATELET
Basophils Absolute: 0 cells/uL (ref 0–200)
Basophils Relative: 0 %
EOS PCT: 4 %
Eosinophils Absolute: 188 cells/uL (ref 15–500)
HCT: 40.2 % (ref 35.0–45.0)
Hemoglobin: 12.8 g/dL (ref 12.0–15.0)
LYMPHS PCT: 27 %
Lymphs Abs: 1269 cells/uL (ref 850–3900)
MCH: 26.6 pg — ABNORMAL LOW (ref 27.0–33.0)
MCHC: 31.8 g/dL — AB (ref 32.0–36.0)
MCV: 83.6 fL (ref 80.0–100.0)
MONOS PCT: 10 %
MPV: 8.9 fL (ref 7.5–12.5)
Monocytes Absolute: 470 cells/uL (ref 200–950)
NEUTROS PCT: 59 %
Neutro Abs: 2773 cells/uL (ref 1500–7800)
Platelets: 315 10*3/uL (ref 140–400)
RBC: 4.81 MIL/uL (ref 3.80–5.10)
RDW: 14.9 % (ref 11.0–15.0)
WBC: 4.7 10*3/uL (ref 3.8–10.8)

## 2016-11-21 MED ORDER — METHYLPREDNISOLONE 4 MG PO TBPK: ORAL_TABLET | ORAL | 0 refills | Status: DC

## 2016-11-21 NOTE — Progress Notes (Signed)
Subjective:   Patient presents for Medicare Annual/Subsequent preventive examination.   Red eye for past month- seen by Eye Doctor told chronic dry eye syndrome [facility])   Seen last week with left shoulder painweaknessa and aching in left arm/ right wrist, concern for pinched nerve, she does get cracking in neck  Also gets headaches in the occipital region and up toward to of head  She has taken some excedrin   Still has right sided pain that goes into leg, did not get xrays from Nov, somes times catches her when she is walking. Did have 1 fall but was not related to pain, was reaching for something and triopped   Prescribed trazodone for sleep and mood , but does not take regularly as it makes her groggy  Feels a weird sensation in her ears , ? If draining, hearing in tact  Seen by podiatry in past told tendinitis in left ankle, uses brace when it flares up      Review Past Medical/Family/Social: Per EMR   Risk Factors  Current exercise habits: Walks  Dietary issues discussed: yes  Cardiac risk factors: HTN, hyperlipidemia  Depression Screen  (Note: if answer to either of the following is "Yes", a more complete depression screening is indicated)  Over the past two weeks, have you felt down, depressed or hopeless? yes Over the past two weeks, have you felt little interest or pleasure in doing things? yes Have you lost interest or pleasure in daily life? No Do you often feel hopeless? No Do you cry easily over simple problems? No   Activities of Daily Living  In your present state of health, do you have any difficulty performing the following activities?:  Driving? No  Managing money? No  Feeding yourself? No  Getting from bed to chair? No  Climbing a flight of stairs? No  Preparing food and eating?: No  Bathing or showering? No  Getting dressed: No  Getting to the toilet? No  Using the toilet:No  Moving around from place to place: No  In the past year  have you fallen or had a near fall?:No  Are you sexually active? yes  Do you have more than one partner? No   Hearing Difficulties: yes Do you often ask people to speak up or repeat themselves? No  Do you experience ringing or noises in your ears? yes Do you have difficulty understanding soft or whispered voices? No  Do you feel that you have a problem with memory? No Do you often misplace items? yes Do you feel safe at home? Yes  Cognitive Testing  Alert? Yes Normal Appearance?Yes  Oriented to person? Yes Place? Yes  Time? Yes  Recall of three objects? Yes  Can perform simple calculations? Yes  Displays appropriate judgment?Yes  Can read the correct time from a watch face?Yes   List the Names of Other Physician/Practitioners you currently use:  Dr. Collene Mares  , [facility] care  Screening Tests / Date Colonoscopy       - Done  2016,had 1 polyp, recommended repeat in 3-5 years               Zostavax  UTD Mammogram UTD Pneumonia- UTD Tetanus/tdap UTD Bone Density- 2015  NORMAL   ROS: GEN- + fatigue denies , fever, weight loss,weakness, recent illness HEENT- denies eye drainage, change in vision, nasal discharge, CVS- denies chest pain, palpitations RESP- denies SOB, cough, wheeze ABD- denies N/V, change in stools, abd pain GU-  denies dysuria, hematuria, dribbling, incontinence MSK- +joint pain, muscle aches, injury Neuro- denies headache, dizziness, syncope, seizure activity  Physical: GEN- NAD, alert and oriented x3 HEENT- PERRL, EOMI, mild injected sclera bilat , pink conjunctiva, MMM, oropharynx clear, cerumen impaction right ear  Neck- Supple, no thryomegaly, decreased ROM, mild crepitus, no bruit , neg spurlings  CVS- RRR, no murmur RESP-CTAB ABD-NABS,soft,NT,ND MSK- Fair ROM spine, neg SLR, fair ROM hips, strength equal bilat , bilat ankle FROM Neuro-CNII-XII in tact, no focal deficits, tone equal LE, sensation grossly in tact EXT- No edema Pulses- Radial, DP-  2+     Assessment:    Annual wellness medicare exam   Plan:    During the course of the visit the patient was educated and counseled about appropriate screening and preventive services including:  Preventative screening is up-to-date   Screen positive for depression, she does not like to take the trazodone but will use 1/2 tablet as needed   HTN- well controlled, no change to meds,   Hyperlipidemia- has been diet controlled, obtain fastin labs  Cerumen impaction- declines irrigation in the office, can use peroxide/water rinse at home  DDD cervical spine- think she is pinched nerve/causing the pain and weakness sensation repeat xrays of spine, obtain xray of lumbar spine as well from previous, start medrol dosepak, no red flags, plan for orthopedics as well after xrays reviewed   Discussed advanced directives / given handout to review with family    Diet review for nutrition referral? Yes ____ Not Indicated __x__  Patient Instructions (the written plan) was given to the patient.  Medicare Attestation  I have personally reviewed:  The patient's medical and social history  Their use of alcohol, tobacco or illicit drugs  Their current medications and supplements  The patient's functional ability including ADLs,fall risks, home safety risks, cognitive, and hearing and visual impairment  Diet and physical activities  Evidence for depression or mood disorders  The patient's weight, height, BMI, and visual acuity have been recorded in the chart. I have made referrals, counseling, and provided education to the patient based on review of the above and I have provided the patient with a written personalized care plan for preventive services.

## 2016-11-21 NOTE — Patient Instructions (Addendum)
F/U 6 months  For ear- peroxide/warm water 1 cap full rinse  For neck /back - get the xrays done   - Rosebud 100 Take the prednisone as prescribed  We will call with lab results  Pending xrays referral to orthopedics

## 2016-11-22 LAB — LIPID PANEL
Cholesterol: 205 mg/dL — ABNORMAL HIGH (ref <200)
HDL: 36 mg/dL — ABNORMAL LOW (ref >50)
LDL CALC: 149 mg/dL — AB (ref <100)
Total CHOL/HDL Ratio: 5.7 Ratio — ABNORMAL HIGH (ref <5.0)
Triglycerides: 102 mg/dL (ref <150)
VLDL: 20 mg/dL (ref <30)

## 2016-11-22 LAB — COMPREHENSIVE METABOLIC PANEL
ALT: 13 U/L (ref 6–29)
AST: 14 U/L (ref 10–35)
Albumin: 3.9 g/dL (ref 3.6–5.1)
Alkaline Phosphatase: 132 U/L — ABNORMAL HIGH (ref 33–130)
BILIRUBIN TOTAL: 0.5 mg/dL (ref 0.2–1.2)
BUN: 12 mg/dL (ref 7–25)
CHLORIDE: 106 mmol/L (ref 98–110)
CO2: 20 mmol/L (ref 20–32)
CREATININE: 0.88 mg/dL (ref 0.60–0.93)
Calcium: 9.6 mg/dL (ref 8.6–10.4)
GLUCOSE: 101 mg/dL — AB (ref 70–99)
Potassium: 4.1 mmol/L (ref 3.5–5.3)
SODIUM: 142 mmol/L (ref 135–146)
Total Protein: 7.2 g/dL (ref 6.1–8.1)

## 2016-11-22 LAB — HEPATITIS C ANTIBODY: HCV AB: NONREACTIVE

## 2016-11-23 ENCOUNTER — Ambulatory Visit
Admission: RE | Admit: 2016-11-23 | Discharge: 2016-11-23 | Disposition: A | Discharge status: Home / Self Care | Payer: Medicare Other | Source: Ambulatory Visit | Attending: Family Medicine | Admitting: Family Medicine

## 2016-11-23 DIAGNOSIS — M5030 Other cervical disc degeneration, unspecified cervical region: Secondary | ICD-10-CM

## 2016-11-30 ENCOUNTER — Telehealth: Payer: Self-pay | Admitting: *Deleted

## 2016-11-30 NOTE — Telephone Encounter (Signed)
Received call from patient.   Reports that she continues to have R sided pain. Requested MD to advise.

## 2016-11-30 NOTE — Telephone Encounter (Signed)
If she is talking about the right sided pain into her leg, then get the xray of lumbar spine and Hip, I last ordered in 2017

## 2016-11-30 NOTE — Telephone Encounter (Signed)
Call placed to patient. LMTRC.  

## 2016-12-03 NOTE — Telephone Encounter (Signed)
Patient returned call and made aware.   States that she will get X-rays.

## 2016-12-04 ENCOUNTER — Other Ambulatory Visit: Payer: Self-pay | Admitting: Family Medicine

## 2016-12-05 ENCOUNTER — Ambulatory Visit (INDEPENDENT_AMBULATORY_CARE_PROVIDER_SITE_OTHER): Payer: Medicare Other | Admitting: Family Medicine

## 2016-12-05 ENCOUNTER — Other Ambulatory Visit: Payer: Self-pay | Admitting: Family Medicine

## 2016-12-05 ENCOUNTER — Ambulatory Visit
Admission: RE | Admit: 2016-12-05 | Discharge: 2016-12-05 | Disposition: A | Discharge status: Home / Self Care | Payer: Medicare Other | Source: Ambulatory Visit | Attending: Family Medicine | Admitting: Family Medicine

## 2016-12-05 ENCOUNTER — Encounter: Payer: Self-pay | Admitting: Family Medicine

## 2016-12-05 VITALS — BP 130/74 | HR 70 | Temp 98.1°F | Resp 14 | Ht 66.0 in | Wt 184.0 lb

## 2016-12-05 DIAGNOSIS — R21 Rash and other nonspecific skin eruption: Secondary | ICD-10-CM | POA: Diagnosis not present

## 2016-12-05 DIAGNOSIS — M25551 Pain in right hip: Secondary | ICD-10-CM

## 2016-12-05 MED ORDER — VALACYCLOVIR HCL 1 G PO TABS: 1000.0000 mg | ORAL_TABLET | Freq: Two times a day (BID) | ORAL | 0 refills | Status: DC

## 2016-12-05 NOTE — Patient Instructions (Addendum)
START the medication for infection We will call with lab results and xray resullts F/U pending results

## 2016-12-05 NOTE — Progress Notes (Signed)
   Subjective:    Patient ID: Nina Olson, female    DOB: 1945-04-13, 71 y.o.   MRN: [id number]  Patient presents for Rectal Pain (reccuring area above rectum that swells and is painful)   Pt here with rectal discomfort for the past week, feels a swelling in the area that comes and goes. Now it is smaller, compared to last week when it initially came up. Had small amount of blood on tissue when she wiped hard. Does get mild constipation. No blood in stool  Had xrays today  for the right Hip/Leg pain.    Review Of Systems:  GEN- denies fatigue, fever, weight loss,weakness, recent illness HEENT- denies eye drainage, change in vision, nasal discharge, CVS- denies chest pain, palpitations RESP- denies SOB, cough, wheeze ABD- denies N/V, change in stools, abd pain GU- denies dysuria, hematuria, dribbling, incontinence MSK- denies joint pain, muscle aches, injury Neuro- denies headache, dizziness, syncope, seizure activity       Objective:    BP 130/74   Pulse 70   Temp 98.1 F (36.7 C) (Oral)   Resp 14   Ht 5\' 6"  (1.676 m)   Wt 184 lb (83.5 kg)   SpO2 98%   BMI 29.70 kg/m  GEN- NAD, alert and oriented x3 Skin- top left gluteal cleft ,3 small ulcerations, clear fluid, non fluctance,mild erythema, mild TTP  Rectum- no external lesions, no hemorroids         Assessment & Plan:      Problem List Items Addressed This Visit    None    Visit Diagnoses    Skin rash    -  Primary   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 Start valtrex 1000mg  BID for 7 days   Relevant Orders   HSV(herpes simplex vrs) 1+2 ab-IgG   HSV(herpes simplex vrs) 1+2 ab-IgM   Viral culture   WOUND CULTURE      Note: This dictation was prepared with Dragon dictation along with smaller phrase technology. Any transcriptional errors that result from this process are unintentional.

## 2016-12-06 LAB — HSV TYPE I/II IGG, IGMW/ REFLEX
HSV 1 Glycoprotein G Ab, IgG: 23.8 {index} — ABNORMAL HIGH
HSV 2 Glycoprotein G Ab, IgG: 7.66 {index} — ABNORMAL HIGH

## 2016-12-08 LAB — HSV 1/2 AB (IGM), IFA W/RFLX TITER
HSV 1 IgM Screen: NEGATIVE
HSV 2 IgM Screen: NEGATIVE

## 2016-12-08 LAB — WOUND CULTURE
GRAM STAIN: NONE SEEN
GRAM STAIN: NONE SEEN
Gram Stain: NONE SEEN

## 2016-12-09 LAB — REFLEX ADENOVIRUS CULTURE

## 2016-12-09 LAB — RFX HSV/VARICELLA ZOSTER RAPID CULT

## 2016-12-11 ENCOUNTER — Other Ambulatory Visit: Payer: Self-pay | Admitting: *Deleted

## 2016-12-11 MED ORDER — ATORVASTATIN CALCIUM 20 MG PO TABS: 20.0000 mg | ORAL_TABLET | Freq: Every day | ORAL | 3 refills | Status: DC

## 2016-12-12 LAB — CYTOMEGALOVIRUS CULTURE

## 2016-12-12 LAB — VIRAL CULTURE VIRC

## 2017-05-12 ENCOUNTER — Other Ambulatory Visit: Payer: Self-pay | Admitting: Family Medicine

## 2017-05-24 ENCOUNTER — Ambulatory Visit: Payer: Medicare Other | Admitting: Family Medicine

## 2017-05-24 ENCOUNTER — Encounter: Payer: Self-pay | Admitting: Family Medicine

## 2017-05-24 VITALS — BP 142/72 | HR 90 | Temp 98.2°F | Resp 14 | Ht 66.0 in | Wt 184.0 lb

## 2017-05-24 DIAGNOSIS — K219 Gastro-esophageal reflux disease without esophagitis: Secondary | ICD-10-CM | POA: Diagnosis not present

## 2017-05-24 DIAGNOSIS — E782 Mixed hyperlipidemia: Secondary | ICD-10-CM

## 2017-05-24 DIAGNOSIS — M5136 Other intervertebral disc degeneration, lumbar region: Secondary | ICD-10-CM

## 2017-05-24 DIAGNOSIS — M1611 Unilateral primary osteoarthritis, right hip: Secondary | ICD-10-CM | POA: Diagnosis not present

## 2017-05-24 DIAGNOSIS — I10 Essential (primary) hypertension: Secondary | ICD-10-CM | POA: Diagnosis not present

## 2017-05-24 DIAGNOSIS — M169 Osteoarthritis of hip, unspecified: Secondary | ICD-10-CM | POA: Insufficient documentation

## 2017-05-24 LAB — CBC WITH DIFFERENTIAL/PLATELET
BASOS PCT: 0.5 %
Basophils Absolute: 22 cells/uL (ref 0–200)
EOS ABS: 189 {cells}/uL (ref 15–500)
Eosinophils Relative: 4.4 %
HCT: 38.7 % (ref 35.0–45.0)
HEMOGLOBIN: 12.7 g/dL (ref 11.7–15.5)
Lymphs Abs: 1389 cells/uL (ref 850–3900)
MCH: 27.2 pg (ref 27.0–33.0)
MCHC: 32.8 g/dL (ref 32.0–36.0)
MCV: 82.9 fL (ref 80.0–100.0)
MONOS PCT: 12.5 %
MPV: 9.8 fL (ref 7.5–12.5)
NEUTROS ABS: 2163 {cells}/uL (ref 1500–7800)
Neutrophils Relative %: 50.3 %
PLATELETS: 261 10*3/uL (ref 140–400)
RBC: 4.67 10*6/uL (ref 3.80–5.10)
RDW: 13.9 % (ref 11.0–15.0)
TOTAL LYMPHOCYTE: 32.3 %
WBC: 4.3 10*3/uL (ref 3.8–10.8)
WBCMIX: 538 {cells}/uL (ref 200–950)

## 2017-05-24 LAB — COMPREHENSIVE METABOLIC PANEL
AG RATIO: 1.5 (calc) (ref 1.0–2.5)
ALBUMIN MSPROF: 4 g/dL (ref 3.6–5.1)
ALT: 13 U/L (ref 6–29)
AST: 16 U/L (ref 10–35)
Alkaline phosphatase (APISO): 100 U/L (ref 33–130)
BUN / CREAT RATIO: 13 (calc) (ref 6–22)
BUN: 13 mg/dL (ref 7–25)
CHLORIDE: 107 mmol/L (ref 98–110)
CO2: 26 mmol/L (ref 20–32)
CREATININE: 0.99 mg/dL — AB (ref 0.60–0.93)
Calcium: 9.5 mg/dL (ref 8.6–10.4)
GLOBULIN: 2.6 g/dL (ref 1.9–3.7)
GLUCOSE: 105 mg/dL — AB (ref 65–99)
POTASSIUM: 4.3 mmol/L (ref 3.5–5.3)
SODIUM: 144 mmol/L (ref 135–146)
Total Bilirubin: 0.4 mg/dL (ref 0.2–1.2)
Total Protein: 6.6 g/dL (ref 6.1–8.1)

## 2017-05-24 LAB — LIPID PANEL
CHOL/HDL RATIO: 3.1 (calc) (ref <5.0)
Cholesterol: 119 mg/dL (ref <200)
HDL: 38 mg/dL — ABNORMAL LOW (ref >50)
LDL Cholesterol (Calc): 63 mg/dL (calc)
NON-HDL CHOLESTEROL (CALC): 81 mg/dL (ref <130)
Triglycerides: 97 mg/dL (ref <150)

## 2017-05-24 MED ORDER — VALACYCLOVIR HCL 1 G PO TABS: 1000.0000 mg | ORAL_TABLET | Freq: Two times a day (BID) | ORAL | 0 refills | Status: DC

## 2017-05-24 NOTE — Progress Notes (Signed)
   Subjective:    Patient ID: Nina Olson, female    DOB: Dec 20, 1945, 72 y.o.   MRN: [id number]  Patient presents for Medication Management (Pt NOT fasting) and Medication Refill   Pt here to f/u chronic medical problems  No specific concerns this morning  Medications reviewed Retention taking losartan as prescribed she is also taking her statin drug for hyperlipidemia and atherosclerosis noted on imaging.  He has had pain into her right hip and flank area she was noted to have degenerative disc disease as well as osteoarthritis in her hip.  States it is intermittent but sometimes she feels it when she is exercising.  Tylenol does help.    Review Of Systems:  GEN- denies fatigue, fever, weight loss,weakness, recent illness HEENT- denies eye drainage, change in vision, nasal discharge, CVS- denies chest pain, palpitations RESP- denies SOB, cough, wheeze ABD- denies N/V, change in stools, abd pain GU- denies dysuria, hematuria, dribbling, incontinence MSK- +joint pain, muscle aches, injury Neuro- denies headache, dizziness, syncope, seizure activity       Objective:    BP (!) 142/72   Pulse 90   Temp 98.2 F (36.8 C) (Oral)   Resp 14   Ht 5\' 6"  (1.676 m)   Wt 184 lb (83.5 kg)   SpO2 97%   BMI 29.70 kg/m  GEN- NAD, alert and oriented x3 HEENT- PERRL, EOMI, non injected sclera, pink conjunctiva, MMM, oropharynx clear CVS- RRR, no murmur RESP-CTAB ABD-NABS,soft,NT,ND MSK- Fair ROM spine, neg SLR, fair ROM hips, strength equal bilat ,  Neuro-CNII-XII in tact, no focal deficits, tone equal LE, sensation grossly in tact EXT- No edema Pulses- Radial, DP- 2+        Assessment & Plan:    The end of the visit she asked about some that she can use the light and some dark spots on her face.  She does have some hyperpigmented macule areas on bilateral cheeks and beneath the eyes.  She can try Ambi Problem List Items Addressed This Visit      Unprioritized   Hyperlipidemia   Relevant Orders   Lipid panel   OA (osteoarthritis) of hip   Relevant Orders   Ambulatory referral to Orthopedic Surgery   GERD (gastroesophageal reflux disease) - Primary    Continues to benefit from Nexium      Essential hypertension    Blood pressure is controlled no change in medication      Relevant Orders   CBC with Differential/Platelet   Comprehensive metabolic panel   DDD (degenerative disc disease), lumbar    Will refer to orthopedics for evaluation.  She is continue the acetaminophen as needed.  Continue with her exercise as tolerated      Relevant Orders   Ambulatory referral to Orthopedic Surgery      Note: This dictation was prepared with Dragon dictation along with smaller phrase technology. Any transcriptional errors that result from this process are unintentional.

## 2017-05-24 NOTE — Assessment & Plan Note (Signed)
Continues to benefit from Nexium

## 2017-05-24 NOTE — Patient Instructions (Signed)
Try the Ambi lightening cream We will call with results F/U 6 months for PHYSICAL

## 2017-05-24 NOTE — Assessment & Plan Note (Signed)
Blood pressure is controlled no change in medication. 

## 2017-05-24 NOTE — Assessment & Plan Note (Signed)
Will refer to orthopedics for evaluation.  She is continue the acetaminophen as needed.  Continue with her exercise as tolerated

## 2017-05-30 ENCOUNTER — Encounter: Payer: Self-pay | Admitting: *Deleted

## 2017-06-11 ENCOUNTER — Ambulatory Visit (INDEPENDENT_AMBULATORY_CARE_PROVIDER_SITE_OTHER): Payer: Medicare Other | Admitting: Orthopaedic Surgery

## 2017-06-11 ENCOUNTER — Other Ambulatory Visit (INDEPENDENT_AMBULATORY_CARE_PROVIDER_SITE_OTHER): Payer: Self-pay

## 2017-06-11 ENCOUNTER — Encounter (INDEPENDENT_AMBULATORY_CARE_PROVIDER_SITE_OTHER): Payer: Self-pay | Admitting: Orthopaedic Surgery

## 2017-06-11 ENCOUNTER — Ambulatory Visit (INDEPENDENT_AMBULATORY_CARE_PROVIDER_SITE_OTHER): Payer: Medicare Other

## 2017-06-11 DIAGNOSIS — M25551 Pain in right hip: Secondary | ICD-10-CM

## 2017-06-11 NOTE — Progress Notes (Addendum)
Office Visit Note   Patient: Nina Olson           Date of Birth: 03-29-1946           MRN: [id number] Visit Date: 06/11/2017              Requested by: Alycia Rossetti, MD [address] [place], [place] [address] PCP: Alycia Rossetti, MD   Assessment & Plan: Visit Diagnoses:  1. Pain in right hip     Plan: Based on normal hip x-rays today as well as a normal hip exam not sure what other treatment I would recommend.  Her pain seems to be more above the pelvis area when she does have it.  I went over her x-rays with her in detail and hip model and again her clinical exam was entirely normal.  I will defer back to her primary care physician.  All questions concerns were answered and addressed.  After thinking about it the patient came back in and is requesting an intra-articular injection under direct fluoroscopy by Dr. Ernestina Patches in her right hip joint with skin and hopefully alleviate any of her symptoms.  I had recommended this before and she deferred again after thinking about it she decided she would like to go ahead and at least try this to see if this can be both diagnostic and therapeutic.  We will work on setting that intra-articular steroid up with Dr. Ernestina Patches.  He can then get her back to me 3-4 weeks after the injection.  Follow-Up Instructions: Return if symptoms worsen or fail to improve.   Orders:  Orders Placed This Encounter  Procedures  . XR HIP UNILAT W OR W/O PELVIS 1V RIGHT   No orders of the defined types were placed in this encounter.     Procedures: No procedures performed   Clinical Data: No additional findings.   Subjective: Chief Complaint  Patient presents with  . Right Hip - Pain  The patient somewhat I am seeing for the first time.  She is a very pleasant and active 72 year old who actually appears much younger.  She started having some type of vague right-sided hip and back pain sometime last year.  She said it flared up during the summer  and she felt it was potentially an internal organ and she points to more of her flank and her body fold above her pelvis where the pain was occurring.  She denies any groin pain.  She says she is feeling good today but was told she had arthritis in her hip.  I do have x-rays on the canopy system from last year of both her lumbar spine and her pelvis from a review and she is and let me  obtain new x-rays of her pelvis and hip today.  She says it sometimes does wake her up at night right now is not stopping activities in any way.  She denies any locking catching in her knee or hip.  She denies any numbness and tingling in her feet or weakness in her legs.  HPI  Review of Systems She denies any headache, chest pain, shortness of breath, fever, chills, nausea, vomiting.  Objective: Vital Signs: There were no vitals taken for this visit.  Physical Exam She is alert and oriented x3 and in no acute distress.  She is walking without a limp and not using an assistive device. Ortho Exam Examination of her lumbar spine back and lower extremities is all  normal today.  I can easily put both hips through full internal and external rotation with no pain in the groin at all and no blocks to rotation.  Her knee exam is able bilaterally.  She has a negative straight leg raise bilaterally.  She has no pain to palpation of the trochanteric area or the issue area of either hip.  Independent review of the lumbar spine films from last year Specialty Comments:  No specialty comments available.  Imaging: Xr Hip Unilat W Or W/o Pelvis 1v Right  Result Date: 06/11/2017 An AP pelvis and lateral of the right hip shows no significant arthritic changes at all.  He can see both hips easily both hips are well located with eccentric joint space with excellent space between the femoral head and superior lateral joint space in general.  There is no significant osteophytes either.  Showed no acute changes with only mild arthritic  changes.    PMFS History: Patient Active Problem List   Diagnosis Date Noted  . OA (osteoarthritis) of hip 05/24/2017  . DDD (degenerative disc disease), lumbar 05/24/2017  . GERD (gastroesophageal reflux disease) 02/10/2016  . Essential hypertension 07/26/2015  . Hyperlipidemia 07/26/2015  . Insomnia secondary to depression with anxiety 07/26/2015  . Palpitations   . Colon polyps    Past Medical History:  Diagnosis Date  . Colon polyps   . GERD (gastroesophageal reflux disease)   . Osteopenia   . Palpitations     Family History  Problem Relation Age of Onset  . Hypertension Mother   . Osteoporosis Mother     Past Surgical History:  Procedure Laterality Date  . CESAREAN SECTION     Social History   Occupational History  . Not on file  Tobacco Use  . Smoking status: Former Research scientist (life sciences)  . Smokeless tobacco: Former Systems developer    Quit date: 04/09/1978  Substance and Sexual Activity  . Alcohol use: No  . Drug use: No  . Sexual activity: No

## 2017-07-08 ENCOUNTER — Ambulatory Visit (INDEPENDENT_AMBULATORY_CARE_PROVIDER_SITE_OTHER): Payer: Medicare Other

## 2017-07-08 ENCOUNTER — Ambulatory Visit (INDEPENDENT_AMBULATORY_CARE_PROVIDER_SITE_OTHER): Payer: Medicare Other | Admitting: Physical Medicine and Rehabilitation

## 2017-07-08 DIAGNOSIS — M25551 Pain in right hip: Secondary | ICD-10-CM

## 2017-07-08 MED ORDER — TRIAMCINOLONE ACETONIDE 40 MG/ML IJ SUSP
80.0000 mg | INTRAMUSCULAR | Status: AC | PRN
  Administered 2017-07-08: 80 mg via INTRA_ARTICULAR

## 2017-07-08 MED ORDER — BUPIVACAINE HCL 0.5 % IJ SOLN
3.0000 mL | INTRAMUSCULAR | Status: AC | PRN
  Administered 2017-07-08: 3 mL via INTRA_ARTICULAR

## 2017-07-08 NOTE — Progress Notes (Signed)
Nina Olson - 72 y.o. female MRN [id number]  Date of birth: 12/12/1945  Office Visit Note: Visit Date: 07/08/2017 PCP: Alycia Rossetti, MD Referred by: Alycia Rossetti, MD  Subjective: Chief Complaint  Patient presents with  . Right Hip - Pain   HPI: Nina Olson is a 72 year old female who comes in today at the request of Dr. Ninfa Linden for diagnostic and hopefully therapeutic anesthetic hip arthrogram on the right.  She reports worsening chronic right mostly lateral hip pain but some anterior hip pain as well radiating into the thigh down to the knee.  She reports worsening with standing and sitting.   ROS Otherwise per HPI.  Assessment & Plan: Visit Diagnoses:  1. Pain in right hip     Plan: Findings:  Diagnostic and hopefully therapeutic anesthetic hip arthrogram.  Patient did not have much relief during the anesthetic phase.    Meds & Orders: No orders of the defined types were placed in this encounter.   Orders Placed This Encounter  Procedures  . Large Joint Inj: R hip joint  . XR C-ARM NO REPORT    Follow-up: Return in about 3 weeks (around 07/29/2017) for Dr. Ninfa Linden.   Procedures: Large Joint Inj: R hip joint on 07/08/2017 8:29 AM Indications: pain and diagnostic evaluation Details: 22 G needle, anterior approach  Arthrogram: Yes  Medications: 3 mL bupivacaine 0.5 %; 80 mg triamcinolone acetonide 40 MG/ML Outcome: tolerated well, no immediate complications  Arthrogram demonstrated excellent flow of contrast throughout the joint surface without extravasation or obvious defect.  The patient had NO relief of symptoms during the anesthetic phase of the injection.  Procedure, treatment alternatives, risks and benefits explained, specific risks discussed. Consent was given by the patient. Immediately prior to procedure a time out was called to verify the correct patient, procedure, equipment, support staff and site/side marked as required. Patient was prepped and  draped in the usual sterile fashion.      No notes on file   Clinical History: No specialty comments available.   She reports that she has quit smoking. She quit smokeless tobacco use about 39 years ago. No results for input(s): HGBA1C, LABURIC in the last 8760 hours.  Objective:  VS:  HT:    WT:   BMI:     BP:   HR: bpm  TEMP: ( )  RESP:  Physical Exam  Musculoskeletal:  Fairly painless range of motion of the right hip although there is some pain with external rotation.    Ortho Exam Imaging: Xr C-arm No Report  Result Date: 07/08/2017 Please see Notes or Procedures tab for imaging impression.   Past Medical/Family/Surgical/Social History: Medications & Allergies reviewed per EMR, new medications updated. Patient Active Problem List   Diagnosis Date Noted  . OA (osteoarthritis) of hip 05/24/2017  . DDD (degenerative disc disease), lumbar 05/24/2017  . GERD (gastroesophageal reflux disease) 02/10/2016  . Essential hypertension 07/26/2015  . Hyperlipidemia 07/26/2015  . Insomnia secondary to depression with anxiety 07/26/2015  . Palpitations   . Colon polyps    Past Medical History:  Diagnosis Date  . Colon polyps   . GERD (gastroesophageal reflux disease)   . Osteopenia   . Palpitations    Family History  Problem Relation Age of Onset  . Hypertension Mother   . Osteoporosis Mother    Past Surgical History:  Procedure Laterality Date  . CESAREAN SECTION     Social History   Occupational History  . Not  on file  Tobacco Use  . Smoking status: Former Research scientist (life sciences)  . Smokeless tobacco: Former Systems developer    Quit date: 04/09/1978  Substance and Sexual Activity  . Alcohol use: No  . Drug use: No  . Sexual activity: Never

## 2017-07-08 NOTE — Progress Notes (Signed)
 .  Numeric Pain Rating Scale and Functional Assessment Average Pain 5   In the last MONTH (on 0-10 scale) has pain interfered with the following?  1. General activity like being  able to carry out your everyday physical activities such as walking, climbing stairs, carrying groceries, or moving a chair?  Rating(3)   +Driver, -BT, -Dye Allergies.  

## 2017-07-08 NOTE — Patient Instructions (Signed)

## 2017-07-22 ENCOUNTER — Ambulatory Visit (INDEPENDENT_AMBULATORY_CARE_PROVIDER_SITE_OTHER): Payer: Medicare Other | Admitting: Orthopaedic Surgery

## 2017-10-04 ENCOUNTER — Other Ambulatory Visit: Payer: Self-pay | Admitting: Family Medicine

## 2017-10-04 DIAGNOSIS — I10 Essential (primary) hypertension: Secondary | ICD-10-CM

## 2017-10-08 ENCOUNTER — Encounter: Payer: Self-pay | Admitting: Family Medicine

## 2017-10-08 ENCOUNTER — Other Ambulatory Visit: Payer: Self-pay

## 2017-10-08 ENCOUNTER — Ambulatory Visit: Payer: Medicare Other | Admitting: Family Medicine

## 2017-10-08 VITALS — BP 138/82 | HR 88 | Temp 98.3°F | Resp 14 | Ht 66.0 in | Wt 177.0 lb

## 2017-10-08 DIAGNOSIS — I10 Essential (primary) hypertension: Secondary | ICD-10-CM | POA: Diagnosis not present

## 2017-10-08 DIAGNOSIS — K6289 Other specified diseases of anus and rectum: Secondary | ICD-10-CM

## 2017-10-08 DIAGNOSIS — M1611 Unilateral primary osteoarthritis, right hip: Secondary | ICD-10-CM

## 2017-10-08 DIAGNOSIS — E782 Mixed hyperlipidemia: Secondary | ICD-10-CM

## 2017-10-08 LAB — CBC WITH DIFFERENTIAL/PLATELET
BASOS PCT: 0.7 %
Basophils Absolute: 30 cells/uL (ref 0–200)
Eosinophils Absolute: 133 cells/uL (ref 15–500)
Eosinophils Relative: 3.1 %
HEMATOCRIT: 41.2 % (ref 35.0–45.0)
HEMOGLOBIN: 13.4 g/dL (ref 11.7–15.5)
LYMPHS ABS: 1372 {cells}/uL (ref 850–3900)
MCH: 27.9 pg (ref 27.0–33.0)
MCHC: 32.5 g/dL (ref 32.0–36.0)
MCV: 85.7 fL (ref 80.0–100.0)
MONOS PCT: 14.6 %
MPV: 9.5 fL (ref 7.5–12.5)
NEUTROS ABS: 2137 {cells}/uL (ref 1500–7800)
Neutrophils Relative %: 49.7 %
Platelets: 283 10*3/uL (ref 140–400)
RBC: 4.81 10*6/uL (ref 3.80–5.10)
RDW: 13 % (ref 11.0–15.0)
Total Lymphocyte: 31.9 %
WBC mixed population: 628 cells/uL (ref 200–950)
WBC: 4.3 10*3/uL (ref 3.8–10.8)

## 2017-10-08 LAB — COMPREHENSIVE METABOLIC PANEL
AG RATIO: 1.6 (calc) (ref 1.0–2.5)
ALBUMIN MSPROF: 4.3 g/dL (ref 3.6–5.1)
ALT: 13 U/L (ref 6–29)
AST: 15 U/L (ref 10–35)
Alkaline phosphatase (APISO): 102 U/L (ref 33–130)
BILIRUBIN TOTAL: 0.5 mg/dL (ref 0.2–1.2)
BUN: 13 mg/dL (ref 7–25)
CO2: 28 mmol/L (ref 20–32)
Calcium: 9.4 mg/dL (ref 8.6–10.4)
Chloride: 107 mmol/L (ref 98–110)
Creat: 0.89 mg/dL (ref 0.60–0.93)
GLUCOSE: 89 mg/dL (ref 65–99)
Globulin: 2.7 g/dL (calc) (ref 1.9–3.7)
POTASSIUM: 4.2 mmol/L (ref 3.5–5.3)
SODIUM: 143 mmol/L (ref 135–146)
TOTAL PROTEIN: 7 g/dL (ref 6.1–8.1)

## 2017-10-08 LAB — LIPID PANEL
Cholesterol: 215 mg/dL — ABNORMAL HIGH (ref <200)
HDL: 44 mg/dL — ABNORMAL LOW (ref >50)
LDL CHOLESTEROL (CALC): 150 mg/dL — AB
NON-HDL CHOLESTEROL (CALC): 171 mg/dL — AB (ref <130)
TRIGLYCERIDES: 101 mg/dL (ref <150)
Total CHOL/HDL Ratio: 4.9 (calc) (ref <5.0)

## 2017-10-08 NOTE — Assessment & Plan Note (Signed)
Benign exam today, recommend she F/UI with orthopedics for repeat injection

## 2017-10-08 NOTE — Assessment & Plan Note (Signed)
On statin, check lipids, LFT

## 2017-10-08 NOTE — Patient Instructions (Addendum)
F/U 6 MONTHS for physical

## 2017-10-08 NOTE — Assessment & Plan Note (Signed)
Controlled with meds, advised to call office if she has concerns with her medications

## 2017-10-08 NOTE — Progress Notes (Signed)
   Subjective:    Patient ID: Nina Olson, female    DOB: 15-Sep-1945, 72 y.o.   MRN: [id number]  Patient presents for Medication Management (is fasting); Sciatic Pain (intermittent R sided lower back/ hip pain ); BP check (elevated BP while at dentist); and Anal Pain (ache to bottom if she sits too long)  HTN- was on losartan 100mg , she stopped after she saw the news about the recall, did not alert our office or pharmacy. She never received a letter stating hers was recalled she was just scared. Was seen at the dentist last week and BP was quite elevated  She restarted last week and BP has come back down.  Has chronic back pain, has DDD in lumbar spine and hip, was seen by orthopedics earlier this year. About a month ago, had pain from right lower back/side and down to thigh. That was the only timeShe also has chronic right hip pain, had cortisone in March by orthopedics   When she sits for a long time gets itching on her buttocks and near anal region, has used valtrex twice , last time went away on its own, not sure if she had a rash or hemorroids    Exercising regulgary 3 times a week, trying to lose weight, weight down 7lbs since Feb   Review Of Systems:  GEN- denies fatigue, fever, weight loss,weakness, recent illness HEENT- denies eye drainage, change in vision, nasal discharge, CVS- denies chest pain, palpitations RESP- denies SOB, cough, wheeze ABD- denies N/V, change in stools, abd pain GU- denies dysuria, hematuria, dribbling, incontinence MSK- + joint pain, muscle aches, injury Neuro- denies headache, dizziness, syncope, seizure activity       Objective:    BP 138/82   Pulse 88   Temp 98.3 F (36.8 C) (Oral)   Resp 14   Ht 5\' 6"  (1.676 m)   Wt 177 lb (80.3 kg)   SpO2 96%   BMI 28.57 kg/m  GEN- NAD, alert and oriented x3 HEENT- PERRL, EOMI, non injected sclera, pink conjunctiva, MMM, oropharynx clear Neck- Supple, no thyromegaly CVS- RRR, no  murmur RESP-CTAB ABD-NABS,soft,NT,ND MSK- fair ROM spine HIPS, NT, no effusion, neg SLR  Rectum- normal tone, skin in tact, no external tags or tears  EXT- No edema Pulses- Radial, DP- 2+        Assessment & Plan:      Problem List Items Addressed This Visit      Unprioritized   Essential hypertension - Primary    Controlled with meds, advised to call office if she has concerns with her medications      Relevant Orders   CBC with Differential/Platelet   Comprehensive metabolic panel   Hyperlipidemia    On statin, check lipids, LFT      Relevant Orders   Lipid panel   OA (osteoarthritis) of hip    Benign exam today, recommend she F/UI with orthopedics for repeat injection       Other Visit Diagnoses    Rectal irritation       benign exam, no hemorroids, no tears, no HSV lesions active, advised she can schedule if she has recurrence or try taking valtrex first see if it resolves      Note: This dictation was prepared with Dragon dictation along with smaller phrase technology. Any transcriptional errors that result from this process are unintentional.

## 2017-10-09 ENCOUNTER — Other Ambulatory Visit: Payer: Self-pay | Admitting: *Deleted

## 2017-10-12 ENCOUNTER — Other Ambulatory Visit: Payer: Self-pay | Admitting: Family Medicine

## 2017-10-17 ENCOUNTER — Encounter: Payer: Self-pay | Admitting: *Deleted

## 2017-11-25 ENCOUNTER — Encounter: Payer: Medicare Other | Admitting: Family Medicine

## 2017-12-12 ENCOUNTER — Other Ambulatory Visit: Payer: Self-pay | Admitting: Family Medicine

## 2018-02-22 ENCOUNTER — Other Ambulatory Visit: Payer: Self-pay | Admitting: Family Medicine

## 2018-02-28 ENCOUNTER — Encounter: Payer: Self-pay | Admitting: Family Medicine

## 2018-02-28 ENCOUNTER — Other Ambulatory Visit: Payer: Self-pay

## 2018-02-28 ENCOUNTER — Ambulatory Visit (INDEPENDENT_AMBULATORY_CARE_PROVIDER_SITE_OTHER): Payer: Medicare Other | Admitting: Family Medicine

## 2018-02-28 VITALS — BP 172/118 | HR 90 | Temp 98.1°F | Resp 16 | Ht 66.0 in | Wt 185.0 lb

## 2018-02-28 DIAGNOSIS — R300 Dysuria: Secondary | ICD-10-CM

## 2018-02-28 DIAGNOSIS — I10 Essential (primary) hypertension: Secondary | ICD-10-CM

## 2018-02-28 LAB — URINALYSIS, ROUTINE W REFLEX MICROSCOPIC
BACTERIA UA: NONE SEEN /HPF
Bilirubin Urine: NEGATIVE
Glucose, UA: NEGATIVE
Ketones, ur: NEGATIVE
Leukocytes, UA: NEGATIVE
Nitrite: NEGATIVE
PROTEIN: NEGATIVE
SPECIFIC GRAVITY, URINE: 1.02 (ref 1.001–1.03)
WBC UA: NONE SEEN /HPF (ref 0–5)
pH: 5.5 (ref 5.0–8.0)

## 2018-02-28 LAB — MICROSCOPIC MESSAGE

## 2018-02-28 MED ORDER — HYDROCHLOROTHIAZIDE 25 MG PO TABS: 25.0000 mg | ORAL_TABLET | Freq: Every day | ORAL | 3 refills | Status: DC

## 2018-02-28 NOTE — Patient Instructions (Signed)
Start hctz once a day  Continue the losartan Record your blood pressures at home We will call next Tuesday for readings F/U  December 2nd or 3rd for blood pressure

## 2018-02-28 NOTE — Progress Notes (Signed)
   Subjective:    Patient ID: Nina Olson, female    DOB: 1945-08-07, 72 y.o.   MRN: [id number]  Patient presents for HTN (BP running high- taken at dentist and noted >160/100- having HA and dizziness)   Pt here with uncontrolled blood pressure. Since august has been going to the dentist at [facility] and her blood pressure has been elevated at every visit. The last visit 168/108 and would not come down .  She denies chest pain and shortness breath but has had headaches and felt off balance at time She has had some frequent uriantion past few weeks as well , a little burning  She has gained back 8lbs since July, she stopped working out in Sept as she has been helping with her daughters newborn, she ate out more  Review Of Systems:  GEN- denies fatigue, fever, weight loss,weakness, recent illness HEENT- denies eye drainage, change in vision, nasal discharge, CVS- denies chest pain, palpitations RESP- denies SOB, cough, wheeze ABD- denies N/V, change in stools, abd pain GU- +dysuria, denies hematuria, dribbling, incontinence MSK- denies joint pain, muscle aches, injury Neuro- denies headache, dizziness, syncope, seizure activity       Objective:    BP (!) 172/118   Pulse 90   Temp 98.1 F (36.7 C) (Oral)   Resp 16   Ht 5\' 6"  (1.676 m)   Wt 185 lb (83.9 kg)   SpO2 97%   BMI 29.86 kg/m  GEN- NAD, alert and oriented x3 HEENT- PERRL, EOMI, non injected sclera, pink conjunctiva, MMM, oropharynx clear Neck- Supple, no thyromegaly CVS- RRR, no murmur RESP-CTAB ABD-NABS,soft,NT,ND EXT- No edema Pulses- Radial, DP- 2+  EKG-NSR, no ST changes       Assessment & Plan:      Problem List Items Addressed This Visit      Unprioritized   Essential hypertension - Primary    Uncontrolled, discussed diet Check labs Start HCTZ 25mg  in addition to the losartan 100mg  F/u 2 weeks in office F/U via phone next week due to holiday with BP readings      Relevant  Medications   hydrochlorothiazide (HYDRODIURIL) 25 MG tablet   Other Relevant Orders   EKG 12-Lead (Completed)   CBC with Differential/Platelet (Completed)   Comprehensive metabolic panel (Completed)   TSH (Completed)    Other Visit Diagnoses    Dysuria       UA/Culture sent   Relevant Orders   Urinalysis, Routine w reflex microscopic (Completed)   Urine Culture (Completed)      Note: This dictation was prepared with Dragon dictation along with smaller phrase technology. Any transcriptional errors that result from this process are unintentional.

## 2018-03-01 LAB — URINE CULTURE
MICRO NUMBER: 91411546
RESULT: NO GROWTH
SPECIMEN QUALITY:: ADEQUATE

## 2018-03-02 ENCOUNTER — Encounter: Payer: Self-pay | Admitting: Family Medicine

## 2018-03-02 NOTE — Assessment & Plan Note (Signed)
Uncontrolled, discussed diet Check labs Start HCTZ 25mg  in addition to the losartan 100mg  F/u 2 weeks in office F/U via phone next week due to holiday with BP readings

## 2018-03-03 LAB — CBC WITH DIFFERENTIAL/PLATELET
BASOS PCT: 0.6 %
Basophils Absolute: 32 cells/uL (ref 0–200)
EOS PCT: 3.6 %
Eosinophils Absolute: 191 cells/uL (ref 15–500)
HCT: 38.9 % (ref 35.0–45.0)
HEMOGLOBIN: 13.1 g/dL (ref 11.7–15.5)
Lymphs Abs: 1383 cells/uL (ref 850–3900)
MCH: 28.1 pg (ref 27.0–33.0)
MCHC: 33.7 g/dL (ref 32.0–36.0)
MCV: 83.3 fL (ref 80.0–100.0)
MPV: 9.9 fL (ref 7.5–12.5)
Monocytes Relative: 10.9 %
NEUTROS ABS: 3116 {cells}/uL (ref 1500–7800)
Neutrophils Relative %: 58.8 %
Platelets: 287 10*3/uL (ref 140–400)
RBC: 4.67 10*6/uL (ref 3.80–5.10)
RDW: 13.1 % (ref 11.0–15.0)
Total Lymphocyte: 26.1 %
WBC: 5.3 10*3/uL (ref 3.8–10.8)
WBCMIX: 578 {cells}/uL (ref 200–950)

## 2018-03-03 LAB — COMPREHENSIVE METABOLIC PANEL
AG RATIO: 1.5 (calc) (ref 1.0–2.5)
ALBUMIN MSPROF: 4.1 g/dL (ref 3.6–5.1)
ALKALINE PHOSPHATASE (APISO): 114 U/L (ref 33–130)
ALT: 14 U/L (ref 6–29)
AST: 17 U/L (ref 10–35)
BUN/Creatinine Ratio: 11 (calc) (ref 6–22)
BUN: 11 mg/dL (ref 7–25)
CHLORIDE: 104 mmol/L (ref 98–110)
CO2: 26 mmol/L (ref 20–32)
CREATININE: 0.97 mg/dL — AB (ref 0.60–0.93)
Calcium: 9.3 mg/dL (ref 8.6–10.4)
GLOBULIN: 2.8 g/dL (ref 1.9–3.7)
Glucose, Bld: 86 mg/dL (ref 65–99)
POTASSIUM: 3.9 mmol/L (ref 3.5–5.3)
Sodium: 142 mmol/L (ref 135–146)
Total Bilirubin: 0.5 mg/dL (ref 0.2–1.2)
Total Protein: 6.9 g/dL (ref 6.1–8.1)

## 2018-03-03 LAB — TEST AUTHORIZATION

## 2018-03-03 LAB — T3, FREE: T3, Free: 2.6 pg/mL (ref 2.3–4.2)

## 2018-03-03 LAB — TSH: TSH: 0.33 mIU/L — ABNORMAL LOW (ref 0.40–4.50)

## 2018-03-03 LAB — T4, FREE: Free T4: 0.9 ng/dL (ref 0.8–1.8)

## 2018-03-04 ENCOUNTER — Telehealth: Payer: Self-pay | Admitting: *Deleted

## 2018-03-04 NOTE — Telephone Encounter (Signed)
Call placed to patient. LMTRC.  

## 2018-03-04 NOTE — Telephone Encounter (Signed)
-----   Message from Alycia Rossetti, MD sent at 03/02/2018  8:05 PM EST ----- Regarding: Call pt Tuesday, get home bP readings She is now on HCTZ and losartan, need home readings

## 2018-03-05 ENCOUNTER — Other Ambulatory Visit: Payer: Self-pay | Admitting: *Deleted

## 2018-03-05 DIAGNOSIS — R7989 Other specified abnormal findings of blood chemistry: Secondary | ICD-10-CM

## 2018-03-05 NOTE — Telephone Encounter (Signed)
Call placed to patient. Reports that BP are as follows:  11/23 156/90 11/24 15890 11/25 140/80 11/26 137/74.  MD to be made aware.

## 2018-03-05 NOTE — Telephone Encounter (Signed)
Blood pressures improving, no change to meds

## 2018-03-05 NOTE — Telephone Encounter (Signed)
Call placed to patient and patient made aware.  

## 2018-03-11 ENCOUNTER — Ambulatory Visit: Payer: Medicare Other | Admitting: Family Medicine

## 2018-04-12 ENCOUNTER — Other Ambulatory Visit: Payer: Self-pay | Admitting: Family Medicine

## 2018-04-15 ENCOUNTER — Encounter: Payer: Self-pay | Admitting: Family Medicine

## 2018-04-15 ENCOUNTER — Ambulatory Visit (INDEPENDENT_AMBULATORY_CARE_PROVIDER_SITE_OTHER): Payer: Medicare Other | Admitting: Family Medicine

## 2018-04-15 ENCOUNTER — Other Ambulatory Visit: Payer: Self-pay

## 2018-04-15 VITALS — BP 122/68 | HR 86 | Temp 98.5°F | Resp 14 | Ht 66.0 in | Wt 183.0 lb

## 2018-04-15 DIAGNOSIS — R7989 Other specified abnormal findings of blood chemistry: Secondary | ICD-10-CM | POA: Diagnosis not present

## 2018-04-15 DIAGNOSIS — Z0000 Encounter for general adult medical examination without abnormal findings: Secondary | ICD-10-CM | POA: Diagnosis not present

## 2018-04-15 DIAGNOSIS — E782 Mixed hyperlipidemia: Secondary | ICD-10-CM

## 2018-04-15 DIAGNOSIS — D509 Iron deficiency anemia, unspecified: Secondary | ICD-10-CM

## 2018-04-15 DIAGNOSIS — Z1211 Encounter for screening for malignant neoplasm of colon: Secondary | ICD-10-CM

## 2018-04-15 DIAGNOSIS — M8589 Other specified disorders of bone density and structure, multiple sites: Secondary | ICD-10-CM

## 2018-04-15 DIAGNOSIS — I10 Essential (primary) hypertension: Secondary | ICD-10-CM

## 2018-04-15 DIAGNOSIS — K635 Polyp of colon: Secondary | ICD-10-CM

## 2018-04-15 DIAGNOSIS — M79674 Pain in right toe(s): Secondary | ICD-10-CM | POA: Diagnosis not present

## 2018-04-15 DIAGNOSIS — Z1239 Encounter for other screening for malignant neoplasm of breast: Secondary | ICD-10-CM

## 2018-04-15 MED ORDER — VALACYCLOVIR HCL 1 G PO TABS: 1000.0000 mg | ORAL_TABLET | Freq: Two times a day (BID) | ORAL | 1 refills | Status: DC

## 2018-04-15 NOTE — Progress Notes (Signed)
Subjective:   Patient presents for Medicare Annual/Subsequent preventive examination.     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 Took tylenol for pain , has a dark spot where pain was     Has known DDD in lumbar and OA of hip     She had steroid injection which helped   She has been doing Yoga   Due for recheck on thyroid, had abnormal TSH was 0.33   Hyperlipidemia- taking lipitor   HTN- taking BP meds without difficulty   History of iron def anemia- she takes Iron tablets randomly       Review Past Medical/Family/Social: Per EMR    Risk Factors  Current exercise habits: does Yoga  Dietary issues discussed: Yes  Cardiac risk factors:  HTN, Hyperlipidemia  Depression Screen  (Note: if answer to either of the following is "Yes", a more complete depression screening is indicated)  Over the past two weeks, have you felt down, depressed or hopeless? No Over the past two weeks, have you felt little interest or pleasure in doing things? No Have you lost interest or pleasure in daily life? No Do you often feel hopeless? No Do you cry easily over simple problems? No   Activities of Daily Living  In your present state of health, do you have any difficulty performing the following activities?:  Driving? No  Managing money? No  Feeding yourself? No  Getting from bed to chair? No  Climbing a flight of stairs? No  Preparing food and eating?: No  Bathing or showering? No  Getting dressed: No  Getting to the toilet? No  Using the toilet:No  Moving around from place to place: No  In the past year have you fallen or had a near fall?:No  Are you sexually active? No  Do you have more than one  partner? No   Hearing Difficulties: No  Do you often ask people to speak up or repeat themselves? No  Do you experience ringing or noises in your ears? No Do you have difficulty understanding soft or whispered voices? No  Do you feel that you have a problem with memory? No Do you often misplace items? No  Do you feel safe at home? Yes  Cognitive Testing  Alert? Yes Normal Appearance?Yes  Oriented to person? Yes Place? Yes  Time? Yes  Recall of three objects? Yes  Can perform simple calculations? Yes  Displays appropriate judgment?Yes  Can read the correct time from a watch face?Yes   List the Names of Other Physician/Practitioners you currently use:   [facility]  [facility]  Orthopedics     Screening Tests / Date Colonoscopy   Due                   Zostavax  UTD Pneumonia- UTD  Mammogram Due  Influenza Vaccine  UTD Tetanus/tdap UTD Bone Density   ROS: GEN- denies fatigue, fever, weight loss,weakness, recent illness HEENT- denies eye drainage, change in vision, nasal discharge, CVS- denies chest pain, palpitations RESP- denies SOB, cough, wheeze ABD- denies N/V, change in stools, abd pain GU- denies dysuria, hematuria,  dribbling, incontinence MSK- + joint pain, muscle aches, injury Neuro- denies headache, dizziness, syncope, seizure activity  GEN- NAD, alert and oriented x3 HEENT- PERRL, EOMI, non injected sclera, pink conjunctiva, MMM, oropharynx clear Neck- Supple, no thryomegaly, NO Bruit CVS- RRR, no murmur RESP-CTAB And-nabs,SOFT,nt,nd EXT- No edema, MSK FROm foot/ toe, hyperigmentation below nail bed right great toe, NT, no erythema Pulses- Radial, DP- 2+    Assessment:    Annual wellness medicare exam   Plan:    During the course of the visit the patient was educated and counseled about appropriate screening and preventive services including:    Colon cancer screeing- due history of polyps   Mammogram - Pt to  schedule   Bone Density - Pt to schedule     Toe pain- concern for probable gout, check Uric acid, no current pain   HTn- no change to BP meds  Anemia- recheck iron stores and HB   Hyperlipidemia- recheck lipids, no change to meds   Osteopenia- Bone density, continue weight bearing exercise, calcium and Vitamin D    Abnormal TSH- recheck TFT   Given handout on Advanced directives   Audit C/Fall/ dEPRESSION screen negative   I aksed her to please have her cardioloist send records since we have none on file.  Diet review for nutrition referral? Yes ____ Not Indicated __x__  Patient Instructions (the written plan) was given to the patient.  Medicare Attestation  I have personally reviewed:  The patient's medical and social history  Their use of alcohol, tobacco or illicit drugs  Their current medications and supplements  The patient's functional ability including ADLs,fall risks, home safety risks, cognitive, and hearing and visual impairment  Diet and physical activities  Evidence for depression or mood disorders  The patient's weight, height, BMI, and visual acuity have been recorded in the chart. I have made referrals, counseling, and provided education to the patient based on review of the above and I have provided the patient with a written personalized care plan for preventive services.

## 2018-04-15 NOTE — Patient Instructions (Addendum)
Referral for colonoscopy  We will call with lab results  Schedule your mammogram and Bone Density F/U 6 months

## 2018-04-16 ENCOUNTER — Other Ambulatory Visit: Payer: Self-pay

## 2018-04-16 LAB — COMPREHENSIVE METABOLIC PANEL
AG RATIO: 1.5 (calc) (ref 1.0–2.5)
ALT: 14 U/L (ref 6–29)
AST: 16 U/L (ref 10–35)
Albumin: 4.1 g/dL (ref 3.6–5.1)
Alkaline phosphatase (APISO): 107 U/L (ref 33–130)
BUN: 13 mg/dL (ref 7–25)
CO2: 26 mmol/L (ref 20–32)
CREATININE: 0.9 mg/dL (ref 0.60–0.93)
Calcium: 9.1 mg/dL (ref 8.6–10.4)
Chloride: 106 mmol/L (ref 98–110)
Globulin: 2.7 g/dL (calc) (ref 1.9–3.7)
Glucose, Bld: 106 mg/dL — ABNORMAL HIGH (ref 65–99)
Potassium: 3.9 mmol/L (ref 3.5–5.3)
Sodium: 143 mmol/L (ref 135–146)
Total Bilirubin: 0.6 mg/dL (ref 0.2–1.2)
Total Protein: 6.8 g/dL (ref 6.1–8.1)

## 2018-04-16 LAB — CBC WITH DIFFERENTIAL/PLATELET
Absolute Monocytes: 596 cells/uL (ref 200–950)
BASOS PCT: 0.5 %
Basophils Absolute: 20 cells/uL (ref 0–200)
Eosinophils Absolute: 192 cells/uL (ref 15–500)
Eosinophils Relative: 4.8 %
HCT: 39.4 % (ref 35.0–45.0)
Hemoglobin: 13 g/dL (ref 11.7–15.5)
Lymphs Abs: 1280 cells/uL (ref 850–3900)
MCH: 28 pg (ref 27.0–33.0)
MCHC: 33 g/dL (ref 32.0–36.0)
MCV: 84.9 fL (ref 80.0–100.0)
MONOS PCT: 14.9 %
MPV: 9.5 fL (ref 7.5–12.5)
Neutro Abs: 1912 cells/uL (ref 1500–7800)
Neutrophils Relative %: 47.8 %
PLATELETS: 279 10*3/uL (ref 140–400)
RBC: 4.64 10*6/uL (ref 3.80–5.10)
RDW: 13.2 % (ref 11.0–15.0)
TOTAL LYMPHOCYTE: 32 %
WBC: 4 10*3/uL (ref 3.8–10.8)

## 2018-04-16 LAB — LIPID PANEL
CHOL/HDL RATIO: 4.2 (calc) (ref <5.0)
CHOLESTEROL: 162 mg/dL (ref <200)
HDL: 39 mg/dL — AB (ref >50)
LDL CHOLESTEROL (CALC): 108 mg/dL — AB
Non-HDL Cholesterol (Calc): 123 mg/dL (calc) (ref <130)
TRIGLYCERIDES: 66 mg/dL (ref <150)

## 2018-04-16 LAB — T3, FREE: T3 FREE: 2.8 pg/mL (ref 2.3–4.2)

## 2018-04-16 LAB — IRON,TIBC AND FERRITIN PANEL
%SAT: 21 % (calc) (ref 16–45)
Ferritin: 85 ng/mL (ref 16–288)
Iron: 70 ug/dL (ref 45–160)
TIBC: 327 mcg/dL (calc) (ref 250–450)

## 2018-04-16 LAB — T4, FREE: FREE T4: 1.1 ng/dL (ref 0.8–1.8)

## 2018-04-16 LAB — TSH: TSH: 0.75 m[IU]/L (ref 0.40–4.50)

## 2018-04-16 LAB — URIC ACID: Uric Acid, Serum: 4.8 mg/dL (ref 2.5–7.0)

## 2018-04-16 NOTE — Patient Outreach (Signed)
[facility]) Care Management  04/16/2018  Nina Olson [birth year] [id number]   Medication Adherence call to Nina Olson spoke with patient she explain she is still taking 1 tablet of Losartan 100 mg and last pick up from [place] was on 03/25/18 for a 30 days supply,she is not due  Until January 17/2020.    [facility] Direct Dial [phone]  Fax [phone] [email or web address]

## 2018-04-17 ENCOUNTER — Encounter: Payer: Self-pay | Admitting: *Deleted

## 2018-07-08 ENCOUNTER — Other Ambulatory Visit: Payer: Self-pay

## 2018-07-08 NOTE — Patient Outreach (Signed)
[facility]) Care Management  07/08/2018  Nina Olson [birth year] [id number]   Medication Adherence call to Nina Olson spoke with patient she is due on Losartan 100 mg she said she is still taking 1 tablet a day and has medication for about 5 more days but ask if we can call doctor to get a 90 days supply. Left a message at doctor office asking for a 90 days supply on all her maintenance medications (Hztc 25 mg & Atorvastatin 20 mg) Nina Olson is showing past due under [facility].   [facility] Direct Dial [phone]  Fax [phone] [email or web address]

## 2018-07-09 ENCOUNTER — Other Ambulatory Visit: Payer: Self-pay | Admitting: *Deleted

## 2018-07-09 DIAGNOSIS — I10 Essential (primary) hypertension: Secondary | ICD-10-CM

## 2018-07-09 MED ORDER — ATORVASTATIN CALCIUM 20 MG PO TABS: 20.0000 mg | ORAL_TABLET | Freq: Every day | ORAL | 3 refills | Status: DC

## 2018-07-09 MED ORDER — HYDROCHLOROTHIAZIDE 25 MG PO TABS: 25.0000 mg | ORAL_TABLET | Freq: Every day | ORAL | 3 refills | Status: DC

## 2018-07-09 MED ORDER — LOSARTAN POTASSIUM 100 MG PO TABS: 100.0000 mg | ORAL_TABLET | Freq: Every day | ORAL | 11 refills | Status: DC

## 2018-08-31 ENCOUNTER — Other Ambulatory Visit: Payer: Self-pay | Admitting: Family Medicine

## 2018-09-03 ENCOUNTER — Other Ambulatory Visit: Payer: Self-pay | Admitting: Family Medicine

## 2018-09-03 MED ORDER — ATORVASTATIN CALCIUM 20 MG PO TABS: 20.0000 mg | ORAL_TABLET | Freq: Every day | ORAL | 3 refills | Status: DC

## 2018-09-16 ENCOUNTER — Other Ambulatory Visit: Payer: Self-pay | Admitting: *Deleted

## 2018-09-16 DIAGNOSIS — Z20822 Contact with and (suspected) exposure to covid-19: Secondary | ICD-10-CM

## 2018-09-18 LAB — NOVEL CORONAVIRUS, NAA: SARS-CoV-2, NAA: NOT DETECTED

## 2018-10-09 ENCOUNTER — Other Ambulatory Visit: Payer: Self-pay | Admitting: Family Medicine

## 2018-10-09 MED ORDER — ATORVASTATIN CALCIUM 20 MG PO TABS: 20.0000 mg | ORAL_TABLET | Freq: Every day | ORAL | 1 refills | Status: DC

## 2018-10-14 ENCOUNTER — Ambulatory Visit: Payer: Medicare Other | Admitting: Family Medicine

## 2018-10-22 ENCOUNTER — Encounter: Payer: Self-pay | Admitting: Family Medicine

## 2018-10-22 ENCOUNTER — Ambulatory Visit (INDEPENDENT_AMBULATORY_CARE_PROVIDER_SITE_OTHER): Payer: Medicare Other | Admitting: Family Medicine

## 2018-10-22 ENCOUNTER — Other Ambulatory Visit: Payer: Self-pay

## 2018-10-22 VITALS — BP 128/66 | HR 80 | Temp 98.6°F | Resp 16 | Ht 66.0 in | Wt 174.0 lb

## 2018-10-22 DIAGNOSIS — R202 Paresthesia of skin: Secondary | ICD-10-CM

## 2018-10-22 DIAGNOSIS — R42 Dizziness and giddiness: Secondary | ICD-10-CM

## 2018-10-22 DIAGNOSIS — L600 Ingrowing nail: Secondary | ICD-10-CM | POA: Diagnosis not present

## 2018-10-22 DIAGNOSIS — I10 Essential (primary) hypertension: Secondary | ICD-10-CM | POA: Diagnosis not present

## 2018-10-22 DIAGNOSIS — E782 Mixed hyperlipidemia: Secondary | ICD-10-CM

## 2018-10-22 DIAGNOSIS — R200 Anesthesia of skin: Secondary | ICD-10-CM

## 2018-10-22 DIAGNOSIS — K219 Gastro-esophageal reflux disease without esophagitis: Secondary | ICD-10-CM | POA: Diagnosis not present

## 2018-10-22 NOTE — Patient Instructions (Addendum)
We will call with lab results Continue your supplements Referral to podiatry Monitor your blood pressure at home F/U 6 months

## 2018-10-22 NOTE — Assessment & Plan Note (Addendum)
Blood pressure is well controlled.  There is no sign of orthostasis with regards to the mild dizzy symptoms she has on occasion.  Advised her to stay hydrated take her time when getting up.  Some of this is probably likely just due to her age.  For her numbness and tingling I will check her blood sugar again as well as her B12 and magnesium she is also being referred to podiatry who can also address the ingrown toenail.  Reflux symptoms are fairly well controlled continue Nexium.   Note she does not have any signs of peripheral vascular disease at this time no swelling good pulses.  Also I do not see any evidence of nerve impingement from the spine causing her symptoms in her toes.

## 2018-10-22 NOTE — Progress Notes (Signed)
Subjective:    Patient ID: Nina Olson, female    DOB: Jun 08, 1945, 73 y.o.   MRN: [id number]  Patient presents for Follow-up (is fasting), Toe Numbness, and Reflux   Pt here to f/u chronic medical problems   Medications reviewed    Left great toe numb and right foot 2-4th digits are numb. Left foot has been numb on and off for > 1 year, right foot is new for past 2 months   No swelling  Right great toe has ingrown nail /nail fungus   she has been taking OTC B12 recently    GERD- occ gets flares of her reflux   She does get some dizzy spells/ feels balance gets off sometimes going down stairs,    Taking zinc, Vitamin C, Vitamin D       Review Of Systems:  GEN- denies fatigue, fever, weight loss,weakness, recent illness HEENT- denies eye drainage, change in vision, nasal discharge, CVS- denies chest pain, palpitations RESP- denies SOB, cough, wheeze ABD- denies N/V, change in stools, abd pain GU- denies dysuria, hematuria, dribbling, incontinence MSK- denies joint pain, muscle aches, injury Neuro- denies headache, dizziness, syncope, seizure activity       Objective:    BP 128/66   Pulse 80   Temp 98.6 F (37 C) (Oral)   Resp 16   Ht 5\' 6"  (1.676 m)   Wt 174 lb (78.9 kg)   SpO2 94%   BMI 28.08 kg/m  GEN- NAD, alert and oriented x3 HEENT- PERRL, EOMI, non injected sclera, pink conjunctiva, MMM, oropharynx clear Neck- Supple, no thyromegaly, no bruit  CVS- RRR, no murmur RESP-CTAB ABD-NABS,soft,NT,ND EXT- No edema Neuro-CNII-XII in tact, Neg SLR, sensation grossly in tact, normal tone LE  nORMAL monofilament bilat feet  Pulses- Radial, DP- 2+  Orthostatics  Lying 122/70 HR 88  Sitting 126/72 HR 88 Standing 140/80 HR  94      Assessment & Plan:      Problem List Items Addressed This Visit      Unprioritized   Essential hypertension    Blood pressure is well controlled.  There is no sign of orthostasis with regards to the mild dizzy symptoms she  has on occasion.  Advised her to stay hydrated take her time when getting up.  Some of this is probably likely just due to her age.  For her numbness and tingling I will check her blood sugar again as well as her B12 and magnesium she is also being referred to podiatry who can also address the ingrown toenail.  Reflux symptoms are fairly well controlled continue Nexium.   Note she does not have any signs of peripheral vascular disease at this time no swelling good pulses.  Also I do not see any evidence of nerve impingement from the spine causing her symptoms in her toes.      Relevant Orders   CBC with Differential/Platelet   Comprehensive metabolic panel   GERD (gastroesophageal reflux disease)   Hyperlipidemia   Relevant Orders   Lipid panel    Other Visit Diagnoses    Numbness and tingling of both feet    -  Primary   Relevant Orders   Ambulatory referral to Podiatry   Vitamin B12   Magnesium   Ingrown toenail of right foot       Relevant Orders   Ambulatory referral to Podiatry   Dizzy spells       Relevant Orders   Vitamin B12  Magnesium      Note: This dictation was prepared with Dragon dictation along with smaller phrase technology. Any transcriptional errors that result from this process are unintentional.

## 2018-10-25 LAB — LIPID PANEL
Cholesterol: 151 mg/dL (ref <200)
HDL: 40 mg/dL — ABNORMAL LOW (ref >=50)
LDL Cholesterol (Calc): 93 mg/dL (calc)
Non-HDL Cholesterol (Calc): 111 mg/dL (calc) (ref <130)
Total CHOL/HDL Ratio: 3.8 (calc) (ref <5.0)
Triglycerides: 87 mg/dL (ref <150)

## 2018-10-25 LAB — COMPREHENSIVE METABOLIC PANEL
AG Ratio: 1.5 (calc) (ref 1.0–2.5)
ALT: 16 U/L (ref 6–29)
AST: 17 U/L (ref 10–35)
Albumin: 4 g/dL (ref 3.6–5.1)
Alkaline phosphatase (APISO): 98 U/L (ref 37–153)
BUN: 11 mg/dL (ref 7–25)
CO2: 32 mmol/L (ref 20–32)
Calcium: 9.2 mg/dL (ref 8.6–10.4)
Chloride: 107 mmol/L (ref 98–110)
Creat: 0.89 mg/dL (ref 0.60–0.93)
Globulin: 2.7 g/dL (calc) (ref 1.9–3.7)
Glucose, Bld: 105 mg/dL — ABNORMAL HIGH (ref 65–99)
Potassium: 3.9 mmol/L (ref 3.5–5.3)
Sodium: 143 mmol/L (ref 135–146)
Total Bilirubin: 0.4 mg/dL (ref 0.2–1.2)
Total Protein: 6.7 g/dL (ref 6.1–8.1)

## 2018-10-25 LAB — HEMOGLOBIN A1C W/OUT EAG: Hgb A1c MFr Bld: 6.4 % of total Hgb — ABNORMAL HIGH (ref <5.7)

## 2018-10-25 LAB — CBC WITH DIFFERENTIAL/PLATELET
Absolute Monocytes: 550 cells/uL (ref 200–950)
Basophils Absolute: 39 cells/uL (ref 0–200)
Basophils Relative: 0.9 %
Eosinophils Absolute: 301 cells/uL (ref 15–500)
Eosinophils Relative: 7 %
HCT: 39.6 % (ref 35.0–45.0)
Hemoglobin: 12.7 g/dL (ref 11.7–15.5)
Lymphs Abs: 1746 cells/uL (ref 850–3900)
MCH: 28.4 pg (ref 27.0–33.0)
MCHC: 32.1 g/dL (ref 32.0–36.0)
MCV: 88.6 fL (ref 80.0–100.0)
MPV: 9.9 fL (ref 7.5–12.5)
Monocytes Relative: 12.8 %
Neutro Abs: 1664 cells/uL (ref 1500–7800)
Neutrophils Relative %: 38.7 %
Platelets: 260 10*3/uL (ref 140–400)
RBC: 4.47 10*6/uL (ref 3.80–5.10)
RDW: 12.6 % (ref 11.0–15.0)
Total Lymphocyte: 40.6 %
WBC: 4.3 10*3/uL (ref 3.8–10.8)

## 2018-10-25 LAB — VITAMIN B12: Vitamin B-12: 1337 pg/mL — ABNORMAL HIGH (ref 200–1100)

## 2018-10-25 LAB — MAGNESIUM: Magnesium: 2.2 mg/dL (ref 1.5–2.5)

## 2018-10-25 LAB — TEST AUTHORIZATION

## 2018-10-27 ENCOUNTER — Encounter: Payer: Self-pay | Admitting: Family Medicine

## 2018-10-27 DIAGNOSIS — R7303 Prediabetes: Secondary | ICD-10-CM | POA: Insufficient documentation

## 2018-10-27 DIAGNOSIS — E1140 Type 2 diabetes mellitus with diabetic neuropathy, unspecified: Secondary | ICD-10-CM | POA: Insufficient documentation

## 2018-10-30 ENCOUNTER — Ambulatory Visit: Payer: Medicare Other | Admitting: Podiatry

## 2018-10-30 ENCOUNTER — Other Ambulatory Visit: Payer: Self-pay | Admitting: Podiatry

## 2018-10-30 ENCOUNTER — Ambulatory Visit (INDEPENDENT_AMBULATORY_CARE_PROVIDER_SITE_OTHER): Payer: Medicare Other

## 2018-10-30 ENCOUNTER — Other Ambulatory Visit: Payer: Self-pay

## 2018-10-30 VITALS — BP 124/72 | HR 90 | Temp 97.3°F

## 2018-10-30 DIAGNOSIS — M79671 Pain in right foot: Secondary | ICD-10-CM

## 2018-10-30 DIAGNOSIS — B351 Tinea unguium: Secondary | ICD-10-CM | POA: Diagnosis not present

## 2018-10-30 DIAGNOSIS — G5762 Lesion of plantar nerve, left lower limb: Secondary | ICD-10-CM

## 2018-10-30 DIAGNOSIS — M79676 Pain in unspecified toe(s): Secondary | ICD-10-CM

## 2018-10-30 DIAGNOSIS — M79672 Pain in left foot: Secondary | ICD-10-CM

## 2018-10-30 DIAGNOSIS — G5761 Lesion of plantar nerve, right lower limb: Secondary | ICD-10-CM | POA: Diagnosis not present

## 2018-10-30 NOTE — Patient Instructions (Signed)

## 2018-10-30 NOTE — Progress Notes (Signed)
  Subjective:  Patient ID: Nina Olson, female    DOB: 16-Jun-1945,  MRN: [id number]  Chief Complaint  Patient presents with  . Numbness    Pt states bilateral numbness and tingling in digits 1-5. Pt states right is worse, and that this issue comes and goes for 2 years. No known injuries.   . Nail Problem    Pt states right 1st medial aspoect is ingrown, 2 year duration, no drainage.    73 y.o. female presents with the above complaint. Hx as above.  Review of Systems: Negative except as noted in the HPI. Denies N/V/F/Ch.  Past Medical History:  Diagnosis Date  . Colon polyps   . GERD (gastroesophageal reflux disease)   . Osteopenia   . Palpitations     Current Outpatient Medications:  .  atorvastatin (LIPITOR) 20 MG tablet, Take 1 tablet (20 mg total) by mouth daily., Disp: 90 tablet, Rfl: 1 .  Cholecalciferol (DIALYVITE VITAMIN D 5000 PO), Take by mouth., Disp: , Rfl:  .  esomeprazole (NEXIUM) 40 MG capsule, TAKE 1 CAPSULE BY MOUTH ONCE DAILY, Disp: 30 capsule, Rfl: 11 .  hydrochlorothiazide (HYDRODIURIL) 25 MG tablet, Take 1 tablet (25 mg total) by mouth daily., Disp: 90 tablet, Rfl: 3 .  losartan (COZAAR) 100 MG tablet, Take 1 tablet (100 mg total) by mouth daily., Disp: 90 tablet, Rfl: 11 .  Multiple Vitamin (MULTI-VITAMIN) tablet, Take by mouth., Disp: , Rfl:  .  valACYclovir (VALTREX) 1000 MG tablet, Take 1 tablet by mouth twice daily, Disp: 14 tablet, Rfl: 0 .  vitamin B-12 (CYANOCOBALAMIN) 100 MCG tablet, Take 100 mcg by mouth daily., Disp: , Rfl:  .  vitamin C (ASCORBIC ACID) 500 MG tablet, Take 1 tablet (500 mg total) by mouth daily., Disp: , Rfl:   Social History   Tobacco Use  Smoking Status Former Smoker  Smokeless Tobacco Former Systems developer  . Quit date: 04/09/1978    No Known Allergies Objective:   Vitals:   10/30/18 1101  BP: 124/72  Pulse: 90  Temp: (!) 97.3 F (36.3 C)   There is no height or weight on file to calculate BMI. Constitutional Well developed.  Well nourished.  Vascular Dorsalis pedis pulses palpable bilaterally. Posterior tibial pulses palpable bilaterally. Capillary refill normal to all digits.  No cyanosis or clubbing noted. Pedal hair growth normal.  Neurologic Normal speech. Oriented to person, place, and time. Epicritic sensation to light touch grossly present bilaterally.  Dermatologic R hallux medial thickening, transverse cracking, black discoloration No open wounds. No skin lesions.  Orthopedic: POP bilat 3rd interspace with click at 3rd interspace bilat   Radiographs: Taken and reviewed no acute fractures or dislocations. No osseous abnormality. Assessment:   1. Morton neuroma, right   2. Morton neuroma, left   3. Pain due to onychomycosis of nail    Plan:  Patient was evaluated and treated and all questions answered.  Interdigital Neuroma, bilaterally -Educated on etiology -Interspace injection delivered as below.  Procedure: Neuroma Injection Location: Bilateral 3rd interspace Skin Prep: Alcohol. Injectate: 0.5 cc 0.5% marcaine plain, 0.5 cc dexamethasone phosphate. Disposition: Patient tolerated procedure well. Injection site dressed with a band-aid.  Onyhcauxis R Hallux -Nail debrided 2/2 pain -Disucussed treatments -Recommend tolcylen gel  Return in about 3 weeks (around 11/20/2018) for Neuroma, Bilateral.

## 2018-11-13 ENCOUNTER — Ambulatory Visit: Payer: Medicare Other | Admitting: Podiatry

## 2018-11-13 ENCOUNTER — Other Ambulatory Visit: Payer: Self-pay

## 2018-11-13 VITALS — Temp 96.3°F

## 2018-11-13 DIAGNOSIS — G5763 Lesion of plantar nerve, bilateral lower limbs: Secondary | ICD-10-CM

## 2018-11-13 NOTE — Progress Notes (Signed)
  Subjective:  Patient ID: Nina Olson, female    DOB: [birth year],  MRN: [id number]  Chief Complaint  Patient presents with  . Neuroma    bilateral, 3 week follow up - doing about the same, not better, not worse    73 y.o. female presents with the above complaint. Hx as above. States the injection helped for a short period of time.  Review of Systems: Negative except as noted in the HPI. Denies N/V/F/Ch.  Past Medical History:  Diagnosis Date  . Colon polyps   . GERD (gastroesophageal reflux disease)   . Osteopenia   . Palpitations     Current Outpatient Medications:  .  atorvastatin (LIPITOR) 20 MG tablet, Take 1 tablet (20 mg total) by mouth daily., Disp: 90 tablet, Rfl: 1 .  Cholecalciferol (DIALYVITE VITAMIN D 5000 PO), Take by mouth., Disp: , Rfl:  .  esomeprazole (NEXIUM) 40 MG capsule, TAKE 1 CAPSULE BY MOUTH ONCE DAILY, Disp: 30 capsule, Rfl: 11 .  hydrochlorothiazide (HYDRODIURIL) 25 MG tablet, Take 1 tablet (25 mg total) by mouth daily., Disp: 90 tablet, Rfl: 3 .  losartan (COZAAR) 100 MG tablet, Take 1 tablet (100 mg total) by mouth daily., Disp: 90 tablet, Rfl: 11 .  Multiple Vitamin (MULTI-VITAMIN) tablet, Take by mouth., Disp: , Rfl:  .  valACYclovir (VALTREX) 1000 MG tablet, Take 1 tablet by mouth twice daily, Disp: 14 tablet, Rfl: 0 .  vitamin B-12 (CYANOCOBALAMIN) 100 MCG tablet, Take 100 mcg by mouth daily., Disp: , Rfl:  .  vitamin C (ASCORBIC ACID) 500 MG tablet, Take 1 tablet (500 mg total) by mouth daily., Disp: , Rfl:   Social History   Tobacco Use  Smoking Status Former Smoker  Smokeless Tobacco Former Systems developer  . Quit date: 04/09/1978    No Known Allergies Objective:   Vitals:   11/13/18 0820  Temp: (!) 96.3 F (35.7 C)   There is no height or weight on file to calculate BMI. Constitutional Well developed. Well nourished.  Vascular Dorsalis pedis pulses palpable bilaterally. Posterior tibial pulses palpable bilaterally. Capillary refill normal  to all digits.  No cyanosis or clubbing noted. Pedal hair growth normal.  Neurologic Normal speech. Oriented to person, place, and time. Epicritic sensation to light touch grossly present bilaterally.  Dermatologic R hallux medial thickening, transverse cracking, black discoloration No open wounds. No skin lesions.  Orthopedic: POP 3rd IS bilat   Radiographs: none Assessment:   1. Morton's metatarsalgia, neuralgia, or neuroma, bilateral    Plan:  Patient was evaluated and treated and all questions answered.  Interdigital Neuroma, bilaterally -Did have short term relief, will repeat injection today. -Injection #2 as below  Procedure: Neuroma Injection Location: Bilateral 3rd interspace Skin Prep: Alcohol. Injectate: 0.5 cc 0.5% marcaine plain, 0.5 cc dexamethasone phosphate. Disposition: Patient tolerated procedure well. Injection site dressed with a band-aid.   No follow-ups on file.

## 2018-12-04 ENCOUNTER — Ambulatory Visit (INDEPENDENT_AMBULATORY_CARE_PROVIDER_SITE_OTHER): Payer: Medicare Other | Admitting: Podiatry

## 2018-12-04 ENCOUNTER — Other Ambulatory Visit: Payer: Self-pay

## 2018-12-04 DIAGNOSIS — G5763 Lesion of plantar nerve, bilateral lower limbs: Secondary | ICD-10-CM

## 2018-12-04 DIAGNOSIS — B351 Tinea unguium: Secondary | ICD-10-CM | POA: Diagnosis not present

## 2018-12-04 NOTE — Progress Notes (Signed)
  Subjective:  Patient ID: Nina Olson, female    DOB: January 02, 1946,  MRN: [id number]  Chief Complaint  Patient presents with  . Neuroma    Pt states has not noticed any difference with the injections, states "may be a little better".   . Nail Problem    Right 1st medial nail ingrown. Pt states wants to discuss medications for it.    73 y.o. female presents with the above complaint. Hx as above. Thinks the injection did help for a few days but the toes are still numb. The right great toenail is still thick and thinks it might be ingrown.  Review of Systems: Negative except as noted in the HPI. Denies N/V/F/Ch.  Past Medical History:  Diagnosis Date  . Colon polyps   . GERD (gastroesophageal reflux disease)   . Osteopenia   . Palpitations     Current Outpatient Medications:  .  atorvastatin (LIPITOR) 20 MG tablet, Take 1 tablet (20 mg total) by mouth daily., Disp: 90 tablet, Rfl: 1 .  Cholecalciferol (DIALYVITE VITAMIN D 5000 PO), Take by mouth., Disp: , Rfl:  .  esomeprazole (NEXIUM) 40 MG capsule, TAKE 1 CAPSULE BY MOUTH ONCE DAILY, Disp: 30 capsule, Rfl: 11 .  hydrochlorothiazide (HYDRODIURIL) 25 MG tablet, Take 1 tablet (25 mg total) by mouth daily., Disp: 90 tablet, Rfl: 3 .  losartan (COZAAR) 100 MG tablet, Take 1 tablet (100 mg total) by mouth daily., Disp: 90 tablet, Rfl: 11 .  Multiple Vitamin (MULTI-VITAMIN) tablet, Take by mouth., Disp: , Rfl:  .  valACYclovir (VALTREX) 1000 MG tablet, Take 1 tablet by mouth twice daily, Disp: 14 tablet, Rfl: 0 .  vitamin B-12 (CYANOCOBALAMIN) 100 MCG tablet, Take 100 mcg by mouth daily., Disp: , Rfl:  .  vitamin C (ASCORBIC ACID) 500 MG tablet, Take 1 tablet (500 mg total) by mouth daily., Disp: , Rfl:   Social History   Tobacco Use  Smoking Status Former Smoker  Smokeless Tobacco Former Systems developer  . Quit date: 04/09/1978    No Known Allergies Objective:   There were no vitals filed for this visit. There is no height or weight on file  to calculate BMI. Constitutional Well developed. Well nourished.  Vascular Dorsalis pedis pulses palpable bilaterally. Posterior tibial pulses palpable bilaterally. Capillary refill normal to all digits.  No cyanosis or clubbing noted. Pedal hair growth normal.  Neurologic Normal speech. Oriented to person, place, and time. Epicritic sensation to light touch grossly present bilaterally.  Dermatologic R hallux medial thickening, transverse cracking, black discoloration No open wounds. No skin lesions.  Orthopedic: POP 3rd IS bilat   Radiographs: none Assessment:   1. Onychomycosis   2. Morton's metatarsalgia, neuralgia, or neuroma, bilateral    Plan:  Patient was evaluated and treated and all questions answered.  Interdigital Neuroma, bilaterally -Did have improvement with injection, but not long lasting. Discussed possible sclerosing therapy. Will commence next visit if symptomatic. -Injection #3 as below  Procedure: Neuroma Injection Location: Bilateral 3rd interspace Skin Prep: Alcohol. Injectate: 0.5 cc 0.5% marcaine plain, 0.5 cc dexamethasone phosphate. Disposition: Patient tolerated procedure well. Injection site dressed with a band-aid.  Onyhcomycosis -Discussed possible nail fungal gel however the nail looks ok. Discussed should it hurt her we could reconsider.   No follow-ups on file.

## 2018-12-05 ENCOUNTER — Other Ambulatory Visit: Payer: Self-pay | Admitting: Family Medicine

## 2018-12-25 ENCOUNTER — Ambulatory Visit (INDEPENDENT_AMBULATORY_CARE_PROVIDER_SITE_OTHER): Payer: Medicare Other | Admitting: Podiatry

## 2018-12-25 ENCOUNTER — Other Ambulatory Visit: Payer: Self-pay

## 2018-12-25 DIAGNOSIS — G5763 Lesion of plantar nerve, bilateral lower limbs: Secondary | ICD-10-CM | POA: Diagnosis not present

## 2018-12-25 NOTE — Progress Notes (Signed)
  Subjective:  Patient ID: Nina Olson, female    DOB: [birth year],  MRN: [id number]  Chief Complaint  Patient presents with  . Neuroma    Pt states injections have helped decrease pain in bilateral neuromas but she still has numbness.    73 y.o. female presents with the above complaint. Hx as above. Concerned there is some dark discoloration at injetion sites.  Review of Systems: Negative except as noted in the HPI. Denies N/V/F/Ch.  Past Medical History:  Diagnosis Date  . Colon polyps   . GERD (gastroesophageal reflux disease)   . Osteopenia   . Palpitations     Current Outpatient Medications:  .  atorvastatin (LIPITOR) 20 MG tablet, Take 1 tablet (20 mg total) by mouth daily., Disp: 90 tablet, Rfl: 1 .  Cholecalciferol (DIALYVITE VITAMIN D 5000 PO), Take by mouth., Disp: , Rfl:  .  esomeprazole (NEXIUM) 40 MG capsule, TAKE 1 CAPSULE BY MOUTH ONCE DAILY, Disp: 30 capsule, Rfl: 11 .  hydrochlorothiazide (HYDRODIURIL) 25 MG tablet, Take 1 tablet (25 mg total) by mouth daily., Disp: 90 tablet, Rfl: 3 .  losartan (COZAAR) 100 MG tablet, Take 1 tablet (100 mg total) by mouth daily., Disp: 90 tablet, Rfl: 11 .  Multiple Vitamin (MULTI-VITAMIN) tablet, Take by mouth., Disp: , Rfl:  .  valACYclovir (VALTREX) 1000 MG tablet, Take 1 tablet by mouth twice daily, Disp: 14 tablet, Rfl: 0 .  vitamin B-12 (CYANOCOBALAMIN) 100 MCG tablet, Take 100 mcg by mouth daily., Disp: , Rfl:  .  vitamin C (ASCORBIC ACID) 500 MG tablet, Take 1 tablet (500 mg total) by mouth daily., Disp: , Rfl:   Social History   Tobacco Use  Smoking Status Former Smoker  Smokeless Tobacco Former Systems developer  . Quit date: 04/09/1978    No Known Allergies Objective:   There were no vitals filed for this visit. There is no height or weight on file to calculate BMI. Constitutional Well developed. Well nourished.  Vascular Dorsalis pedis pulses palpable bilaterally. Posterior tibial pulses palpable bilaterally. Capillary  refill normal to all digits.  No cyanosis or clubbing noted. Pedal hair growth normal.  Neurologic Normal speech. Oriented to person, place, and time. Epicritic sensation to light touch grossly present bilaterally.  Dermatologic R hallux medial thickening, transverse cracking, black discoloration No open wounds. No skin lesions.  Orthopedic: POP 3rd IS bilat   Radiographs: none Assessment:   1. Morton's metatarsalgia, neuralgia, or neuroma, bilateral    Plan:  Patient was evaluated and treated and all questions answered.  Interdigital Neuroma, bilaterally -Sclerosing injection bilat -Discussed discoloration likely transient. Steroid likely to hypopigment not hyperpigment skin.  Procedure: Neurolysis Location: Bilateral 3rd interspace Skin Prep: Alcohol. Injectate: 4% alcohol sclerosing injection. Disposition: Patient tolerated procedure well. Injection site dressed with a band-aid.     Return in about 2 weeks (around 01/08/2019) for Neuroma, Bilateral sclerosing injection.

## 2019-01-08 ENCOUNTER — Ambulatory Visit: Payer: Medicare Other | Admitting: Podiatry

## 2019-01-08 ENCOUNTER — Other Ambulatory Visit: Payer: Self-pay

## 2019-01-08 DIAGNOSIS — G5761 Lesion of plantar nerve, right lower limb: Secondary | ICD-10-CM | POA: Diagnosis not present

## 2019-01-08 DIAGNOSIS — G5763 Lesion of plantar nerve, bilateral lower limbs: Secondary | ICD-10-CM

## 2019-01-08 DIAGNOSIS — G5762 Lesion of plantar nerve, left lower limb: Secondary | ICD-10-CM | POA: Diagnosis not present

## 2019-01-08 NOTE — Progress Notes (Signed)
  Subjective:  Patient ID: Nina Olson, female    DOB: 16-Aug-1945,  MRN: [id number]  Chief Complaint  Patient presents with  . Foot Pain    pt is here for a 2 week f/u on bil foot pain, pt states that she is doing a lot better, but is still having some numbness and tingling in the feet    73 y.o. female presents with the above complaint. Hx as above. Overall doing better.  Review of Systems: Negative except as noted in the HPI. Denies N/V/F/Ch.  Past Medical History:  Diagnosis Date  . Colon polyps   . GERD (gastroesophageal reflux disease)   . Osteopenia   . Palpitations     Current Outpatient Medications:  .  atorvastatin (LIPITOR) 20 MG tablet, Take 1 tablet (20 mg total) by mouth daily., Disp: 90 tablet, Rfl: 1 .  Cholecalciferol (DIALYVITE VITAMIN D 5000 PO), Take by mouth., Disp: , Rfl:  .  esomeprazole (NEXIUM) 40 MG capsule, TAKE 1 CAPSULE BY MOUTH ONCE DAILY, Disp: 30 capsule, Rfl: 11 .  hydrochlorothiazide (HYDRODIURIL) 25 MG tablet, Take 1 tablet (25 mg total) by mouth daily., Disp: 90 tablet, Rfl: 3 .  losartan (COZAAR) 100 MG tablet, Take 1 tablet (100 mg total) by mouth daily., Disp: 90 tablet, Rfl: 11 .  Multiple Vitamin (MULTI-VITAMIN) tablet, Take by mouth., Disp: , Rfl:  .  valACYclovir (VALTREX) 1000 MG tablet, Take 1 tablet by mouth twice daily, Disp: 14 tablet, Rfl: 0 .  vitamin B-12 (CYANOCOBALAMIN) 100 MCG tablet, Take 100 mcg by mouth daily., Disp: , Rfl:  .  vitamin C (ASCORBIC ACID) 500 MG tablet, Take 1 tablet (500 mg total) by mouth daily., Disp: , Rfl:   Social History   Tobacco Use  Smoking Status Former Smoker  Smokeless Tobacco Former Systems developer  . Quit date: 04/09/1978    No Known Allergies Objective:   There were no vitals filed for this visit. There is no height or weight on file to calculate BMI. Constitutional Well developed. Well nourished.  Vascular Dorsalis pedis pulses palpable bilaterally. Posterior tibial pulses palpable bilaterally.  Capillary refill normal to all digits.  No cyanosis or clubbing noted. Pedal hair growth normal.  Neurologic Normal speech. Oriented to person, place, and time. Epicritic sensation to light touch grossly present bilaterally.  Dermatologic R hallux medial thickening, transverse cracking, black discoloration No open wounds. No skin lesions.  Orthopedic: POP 3rd IS bilat   Radiographs: none Assessment:   1. Morton's metatarsalgia, neuralgia, or neuroma, bilateral    Plan:  Patient was evaluated and treated and all questions answered.  Interdigital Neuroma, bilaterally -Sclerosing injection b/l   Procedure: Neurolysis Location: Bilateral 3rd interspace Skin Prep: Alcohol. Injectate: 4% alcohol sclerosing injection. Disposition: Patient tolerated procedure well. Injection site dressed with a band-aid.    Return in about 2 weeks (around 01/22/2019) for sclerosing injection bilat.

## 2019-01-13 ENCOUNTER — Other Ambulatory Visit: Payer: Self-pay | Admitting: Family Medicine

## 2019-01-22 ENCOUNTER — Ambulatory Visit: Payer: Medicare Other | Admitting: Podiatry

## 2019-01-23 ENCOUNTER — Ambulatory Visit: Payer: Medicare Other | Admitting: Podiatry

## 2019-01-23 ENCOUNTER — Encounter: Payer: Self-pay | Admitting: Podiatry

## 2019-01-23 ENCOUNTER — Other Ambulatory Visit: Payer: Self-pay

## 2019-01-23 DIAGNOSIS — G5762 Lesion of plantar nerve, left lower limb: Secondary | ICD-10-CM

## 2019-01-23 DIAGNOSIS — G5761 Lesion of plantar nerve, right lower limb: Secondary | ICD-10-CM

## 2019-01-23 NOTE — Progress Notes (Signed)
Subjective:  Patient ID: Nina Olson, female    DOB: 1945-08-03,  MRN: [id number]  Chief Complaint  Patient presents with  . Foot Pain    pt states that she is feeling alot better, but still has a tingling sensation, pt states that the injection has helped somewhat    73 y.o. female presents with the above complaint.  Patient states that she has third interspace neuromas that she has been getting injections by Dr. March Rummage.  She states that her pain has improved considerably.  She still has numbness tingling sensation.  Injections she said she received has helped but not completely cover the pain yet.  Her A1c is 6.4 she is here today for her sixth injection.   Review of Systems: Negative except as noted in the HPI. Denies N/V/F/Ch.  Past Medical History:  Diagnosis Date  . Colon polyps   . GERD (gastroesophageal reflux disease)   . Osteopenia   . Palpitations     Current Outpatient Medications:  .  atorvastatin (LIPITOR) 20 MG tablet, Take 1 tablet (20 mg total) by mouth daily., Disp: 90 tablet, Rfl: 1 .  Cholecalciferol (DIALYVITE VITAMIN D 5000 PO), Take by mouth., Disp: , Rfl:  .  esomeprazole (NEXIUM) 40 MG capsule, Take 1 capsule by mouth once daily, Disp: 30 capsule, Rfl: 0 .  hydrochlorothiazide (HYDRODIURIL) 25 MG tablet, Take 1 tablet (25 mg total) by mouth daily., Disp: 90 tablet, Rfl: 3 .  losartan (COZAAR) 100 MG tablet, Take 1 tablet (100 mg total) by mouth daily., Disp: 90 tablet, Rfl: 11 .  Multiple Vitamin (MULTI-VITAMIN) tablet, Take by mouth., Disp: , Rfl:  .  polyethylene glycol-electrolytes (NULYTELY/GOLYTELY) 420 g solution, See admin instructions., Disp: , Rfl:  .  valACYclovir (VALTREX) 1000 MG tablet, Take 1 tablet by mouth twice daily, Disp: 14 tablet, Rfl: 0 .  vitamin B-12 (CYANOCOBALAMIN) 100 MCG tablet, Take 100 mcg by mouth daily., Disp: , Rfl:  .  vitamin C (ASCORBIC ACID) 500 MG tablet, Take 1 tablet (500 mg total) by mouth daily., Disp: , Rfl:    Social History   Tobacco Use  Smoking Status Former Smoker  Smokeless Tobacco Former Systems developer  . Quit date: 04/09/1978    No Known Allergies Objective:  There were no vitals filed for this visit. There is no height or weight on file to calculate BMI. Constitutional Well developed. Well nourished.  Vascular Dorsalis pedis pulses palpable bilaterally. Posterior tibial pulses palpable bilaterally. Capillary refill normal to all digits.  No cyanosis or clubbing noted. Pedal hair growth normal.  Neurologic Normal speech. Oriented to person, place, and time. Pain on palpation to the third interspace bilaterally.  Positive Mulder's click.  Dermatologic Nails well groomed and normal in appearance. No open wounds. No skin lesions.  Orthopedic:  Pain on lateral squeeze test to bilateral third interspace.   Radiographs: None Assessment:   1. Morton neuroma, right   2. Morton neuroma, left    Plan:  Patient was evaluated and treated and all questions answered.  Bilateral third interspace neuroma -This will be patient's sixth injection of dehydrated alcohol.  Her pain is improving so I am confident that this therapy is working. -After obtaining consent, and per orders of Dr. Posey Pronto, injection of 4% sclerosing alcohol injection x2 to both third interspace bilaterally given by Felipa Furnace.  No complications noted patient instructed to remain in clinic for 20 minutes afterwards, and to report any adverse reaction to me immediately. -I have asked  her to monitor the improvement of her injections and Dr. March Rummage will see her back again in about 2 weeks  No follow-ups on file.

## 2019-02-02 ENCOUNTER — Encounter: Payer: Self-pay | Admitting: Family Medicine

## 2019-02-02 ENCOUNTER — Ambulatory Visit (INDEPENDENT_AMBULATORY_CARE_PROVIDER_SITE_OTHER): Payer: Medicare Other | Admitting: Family Medicine

## 2019-02-02 ENCOUNTER — Other Ambulatory Visit: Payer: Self-pay

## 2019-02-02 VITALS — BP 130/74 | HR 88 | Temp 98.4°F | Resp 16 | Ht 66.0 in | Wt 171.0 lb

## 2019-02-02 DIAGNOSIS — I10 Essential (primary) hypertension: Secondary | ICD-10-CM | POA: Diagnosis not present

## 2019-02-02 DIAGNOSIS — Z23 Encounter for immunization: Secondary | ICD-10-CM | POA: Diagnosis not present

## 2019-02-02 DIAGNOSIS — R7303 Prediabetes: Secondary | ICD-10-CM | POA: Diagnosis not present

## 2019-02-02 DIAGNOSIS — E782 Mixed hyperlipidemia: Secondary | ICD-10-CM | POA: Diagnosis not present

## 2019-02-02 DIAGNOSIS — M8589 Other specified disorders of bone density and structure, multiple sites: Secondary | ICD-10-CM

## 2019-02-02 NOTE — Patient Instructions (Addendum)
F/U change f/u in Jan to her [facility] VISIT

## 2019-02-02 NOTE — Assessment & Plan Note (Signed)
She has been working on dietary changes, recheck A1C

## 2019-02-02 NOTE — Assessment & Plan Note (Signed)
Pt on calcium and vitamin D

## 2019-02-02 NOTE — Assessment & Plan Note (Signed)
Lipids at goal on statin drug, recheck LFT

## 2019-02-02 NOTE — Progress Notes (Signed)
   Subjective:    Patient ID: Nina Olson, female    DOB: Aug 25, 1945, 73 y.o.   MRN: [id number]  Patient presents for Follow-up (is fasting)   Pt here to f/u chronic medcal problems    Meds reviewed   She is still following  with neur podiatry for the neuroma    She is getting injections  pain has improved some    HTN- losartan and HCTZ taking without difficulty   Hyperlipidemia- taking lipitor as  prescribed   taking supplements as prescribed    weight down 3lbs since July, states she has a good appetite , GERD is umder control   she is walking daily   Pre diabetes last A`1C 6.4$% ,she has cut back on sweets   Colonoscopy scheduled for Wed   Review Of Systems:  GEN- denies fatigue, fever, weight loss,weakness, recent illness HEENT- denies eye drainage, change in vision, nasal discharge, CVS- denies chest pain, palpitations RESP- denies SOB, cough, wheeze ABD- denies N/V, change in stools, abd pain GU- denies dysuria, hematuria, dribbling, incontinence MSK- denies joint pain, muscle aches, injury Neuro- denies headache, dizziness, syncope, seizure activity       Objective:    BP 130/74   Pulse 88   Temp 98.4 F (36.9 C) (Oral)   Resp 16   Ht 5\' 6"  (1.676 m)   Wt 171 lb (77.6 kg)   SpO2 98%   BMI 27.60 kg/m  GEN- NAD, alert and oriented x3 HEENT- PERRL, EOMI, non injected sclera, pink conjunctiva, MMM, oropharynx clear Neck- Supple, no thyromegaly CVS- RRR, no murmur RESP-CTAB ABD-NABS,soft,NT,ND EXT- No edema Pulses- Radial, DP- 2+        Assessment & Plan:      Problem List Items Addressed This Visit      Unprioritized   Borderline diabetes    She has been working on dietary changes, recheck A1C       Relevant Orders   Hemoglobin A1c   Essential hypertension - Primary    Controlled no changes, check renal function      Relevant Orders   Comprehensive metabolic panel   CBC with Differential/Platelet   Hyperlipidemia    Lipids at  goal on statin drug, recheck LFT      Osteopenia    Pt on calcium and vitamin D        Other Visit Diagnoses    Need for immunization against influenza       Relevant Orders   Flu Vaccine QUAD High Dose(Fluad) (Completed)      Note: This dictation was prepared with Dragon dictation along with smaller phrase technology. Any transcriptional errors that result from this process are unintentional.

## 2019-02-02 NOTE — Assessment & Plan Note (Signed)
Controlled no changes , check renal function  

## 2019-02-03 LAB — COMPREHENSIVE METABOLIC PANEL
AG Ratio: 1.4 (calc) (ref 1.0–2.5)
ALT: 21 U/L (ref 6–29)
AST: 19 U/L (ref 10–35)
Albumin: 4.2 g/dL (ref 3.6–5.1)
Alkaline phosphatase (APISO): 98 U/L (ref 37–153)
BUN: 13 mg/dL (ref 7–25)
CO2: 27 mmol/L (ref 20–32)
Calcium: 9.6 mg/dL (ref 8.6–10.4)
Chloride: 106 mmol/L (ref 98–110)
Creat: 0.78 mg/dL (ref 0.60–0.93)
Globulin: 2.9 g/dL (calc) (ref 1.9–3.7)
Glucose, Bld: 101 mg/dL — ABNORMAL HIGH (ref 65–99)
Potassium: 4.1 mmol/L (ref 3.5–5.3)
Sodium: 142 mmol/L (ref 135–146)
Total Bilirubin: 0.7 mg/dL (ref 0.2–1.2)
Total Protein: 7.1 g/dL (ref 6.1–8.1)

## 2019-02-03 LAB — HEMOGLOBIN A1C
Hgb A1c MFr Bld: 6.8 % of total Hgb — ABNORMAL HIGH (ref <5.7)
Mean Plasma Glucose: 148 (calc)
eAG (mmol/L): 8.2 (calc)

## 2019-02-03 LAB — CBC WITH DIFFERENTIAL/PLATELET
Absolute Monocytes: 696 cells/uL (ref 200–950)
Basophils Absolute: 29 cells/uL (ref 0–200)
Basophils Relative: 0.6 %
Eosinophils Absolute: 422 cells/uL (ref 15–500)
Eosinophils Relative: 8.8 %
HCT: 38.1 % (ref 35.0–45.0)
Hemoglobin: 12.8 g/dL (ref 11.7–15.5)
Lymphs Abs: 1478 cells/uL (ref 850–3900)
MCH: 28.4 pg (ref 27.0–33.0)
MCHC: 33.6 g/dL (ref 32.0–36.0)
MCV: 84.7 fL (ref 80.0–100.0)
MPV: 9.5 fL (ref 7.5–12.5)
Monocytes Relative: 14.5 %
Neutro Abs: 2174 cells/uL (ref 1500–7800)
Neutrophils Relative %: 45.3 %
Platelets: 311 10*3/uL (ref 140–400)
RBC: 4.5 10*6/uL (ref 3.80–5.10)
RDW: 13.8 % (ref 11.0–15.0)
Total Lymphocyte: 30.8 %
WBC: 4.8 10*3/uL (ref 3.8–10.8)

## 2019-02-10 ENCOUNTER — Other Ambulatory Visit: Payer: Self-pay

## 2019-02-10 MED ORDER — BLOOD GLUCOSE METER KIT: PACK | 0 refills | Status: DC

## 2019-02-10 MED ORDER — BLOOD GLUCOSE METER KIT: 3.0000 | PACK | 0 refills | Status: DC

## 2019-02-12 ENCOUNTER — Encounter: Payer: Self-pay | Admitting: Podiatry

## 2019-02-12 ENCOUNTER — Other Ambulatory Visit: Payer: Self-pay

## 2019-02-12 ENCOUNTER — Ambulatory Visit: Payer: Medicare Other | Admitting: Podiatry

## 2019-02-12 DIAGNOSIS — G5762 Lesion of plantar nerve, left lower limb: Secondary | ICD-10-CM | POA: Diagnosis not present

## 2019-02-12 DIAGNOSIS — G5761 Lesion of plantar nerve, right lower limb: Secondary | ICD-10-CM

## 2019-02-12 NOTE — Progress Notes (Signed)
  Subjective:  Patient ID: Nina Olson, female    DOB: 1945-04-24,  MRN: [id number]  Chief Complaint  Patient presents with  . Foot Pain    pt is here for a neuroma f/u, pt states that she is still having a tingling sensation, and numbness as well   73 y.o. female presents with the above complaint. Hx as above.   Review of Systems: Negative except as noted in the HPI. Denies N/V/F/Ch.  Past Medical History:  Diagnosis Date  . Colon polyps   . GERD (gastroesophageal reflux disease)   . Osteopenia   . Palpitations     Current Outpatient Medications:  .  atorvastatin (LIPITOR) 20 MG tablet, Take 1 tablet (20 mg total) by mouth daily., Disp: 90 tablet, Rfl: 1 .  blood glucose meter kit and supplies, 3 each by Other route once a week. Dispense based on patient and insurance preference. Use up to three times a week as directed. (FOR ICD-10 E10.9, E11.9)., Disp: 1 each, Rfl: 0 .  Cholecalciferol (DIALYVITE VITAMIN D 5000 PO), Take by mouth., Disp: , Rfl:  .  esomeprazole (NEXIUM) 40 MG capsule, Take 1 capsule by mouth once daily, Disp: 30 capsule, Rfl: 0 .  hydrochlorothiazide (HYDRODIURIL) 25 MG tablet, Take 1 tablet (25 mg total) by mouth daily., Disp: 90 tablet, Rfl: 3 .  losartan (COZAAR) 100 MG tablet, Take 1 tablet (100 mg total) by mouth daily., Disp: 90 tablet, Rfl: 11 .  Multiple Vitamin (MULTI-VITAMIN) tablet, Take by mouth., Disp: , Rfl:  .  polyethylene glycol-electrolytes (NULYTELY/GOLYTELY) 420 g solution, See admin instructions., Disp: , Rfl:  .  valACYclovir (VALTREX) 1000 MG tablet, Take 1 tablet by mouth twice daily, Disp: 14 tablet, Rfl: 0 .  vitamin B-12 (CYANOCOBALAMIN) 100 MCG tablet, Take 100 mcg by mouth daily., Disp: , Rfl:  .  vitamin C (ASCORBIC ACID) 500 MG tablet, Take 1 tablet (500 mg total) by mouth daily., Disp: , Rfl:   Social History   Tobacco Use  Smoking Status Former Smoker  Smokeless Tobacco Former Systems developer  . Quit date: 04/09/1978    No Known  Allergies Objective:   There were no vitals filed for this visit. There is no height or weight on file to calculate BMI. Constitutional Well developed. Well nourished.  Vascular Dorsalis pedis pulses palpable bilaterally. Posterior tibial pulses palpable bilaterally. Capillary refill normal to all digits.  No cyanosis or clubbing noted. Pedal hair growth normal.  Neurologic Normal speech. Oriented to person, place, and time. Epicritic sensation to light touch grossly present bilaterally.  Dermatologic No open wounds. No skin lesions.  Orthopedic: POP 3rd IS bilat, 2nd IS bilat   Radiographs: none Assessment:   1. Morton neuroma, right   2. Morton neuroma, left    Plan:  Patient was evaluated and treated and all questions answered.  Interdigital Neuroma, bilaterally -Sclerosing injection b/l  -Seventh injection to third interspace. Additional injection to 2nd interspace today.  Procedure: Neurolysis Location: Bilateral 2nd/3rd interspace Skin Prep: Alcohol. Injectate: 4% alcohol sclerosing injection. Disposition: Patient tolerated procedure well. Injection site dressed with a band-aid.  Return in about 2 weeks (around 02/26/2019) for Neuroma alcohol injection.

## 2019-02-25 ENCOUNTER — Other Ambulatory Visit: Payer: Self-pay | Admitting: Family Medicine

## 2019-02-26 ENCOUNTER — Ambulatory Visit: Payer: Medicare Other | Admitting: Podiatry

## 2019-04-19 ENCOUNTER — Other Ambulatory Visit: Payer: Self-pay | Admitting: Family Medicine

## 2019-04-23 ENCOUNTER — Other Ambulatory Visit: Payer: Self-pay

## 2019-04-23 ENCOUNTER — Other Ambulatory Visit: Payer: Self-pay | Admitting: Cardiology

## 2019-04-23 ENCOUNTER — Ambulatory Visit (INDEPENDENT_AMBULATORY_CARE_PROVIDER_SITE_OTHER): Payer: Medicare HMO | Admitting: Podiatry

## 2019-04-23 DIAGNOSIS — G588 Other specified mononeuropathies: Secondary | ICD-10-CM

## 2019-04-23 DIAGNOSIS — Z20822 Contact with and (suspected) exposure to covid-19: Secondary | ICD-10-CM

## 2019-04-23 NOTE — Progress Notes (Signed)
  Subjective:  Patient ID: Nina Olson, female    DOB: 30-May-1945,  MRN: [id number]  Chief Complaint  Patient presents with  . Neuroma    Pt states right neuroma is feeling a bit worse and left is feeling a bit better.     74 y.o. female presents with the above complaint. History confirmed with patient.   Objective:  Physical Exam: warm, good capillary refill, no trophic changes or ulcerative lesions, normal DP and PT pulses and normal sensory exam. Left Foot: tenderness between the 2nd and 3rd and 3rd and 4th metatarsal head  Right Foot: tenderness between the 2nd and 3rd and 3rd and 4th metatarsal head   Assessment:   1. Interdigital neuroma    Plan:  Patient was evaluated and treated and all questions answered.  Interdigital Neuroma -Injection delivered to the affected interspaces. Restart sclerosing injections.  Procedure: Sclerosing Nerve Injection Location: Bilateral 2nd, 3rd interspace Skin Prep: Alcohol. Injectate: 1.5 cc 4% sclerosing alcohol injection Disposition: Patient tolerated procedure well. Injection site dressed with a band-aid.  Return in about 2 weeks (around 05/07/2019) for Neuroma, Bilateral.

## 2019-04-24 ENCOUNTER — Ambulatory Visit: Payer: Medicare Other | Admitting: Family Medicine

## 2019-04-24 LAB — NOVEL CORONAVIRUS, NAA: SARS-CoV-2, NAA: NOT DETECTED

## 2019-04-28 ENCOUNTER — Ambulatory Visit (INDEPENDENT_AMBULATORY_CARE_PROVIDER_SITE_OTHER): Payer: Medicare HMO | Admitting: Family Medicine

## 2019-04-28 ENCOUNTER — Encounter: Payer: Self-pay | Admitting: Family Medicine

## 2019-04-28 ENCOUNTER — Other Ambulatory Visit: Payer: Self-pay

## 2019-04-28 VITALS — BP 124/68 | HR 80 | Temp 98.4°F | Resp 16 | Ht 66.0 in | Wt 170.0 lb

## 2019-04-28 DIAGNOSIS — N393 Stress incontinence (female) (male): Secondary | ICD-10-CM | POA: Diagnosis not present

## 2019-04-28 DIAGNOSIS — N949 Unspecified condition associated with female genital organs and menstrual cycle: Secondary | ICD-10-CM

## 2019-04-28 DIAGNOSIS — I10 Essential (primary) hypertension: Secondary | ICD-10-CM

## 2019-04-28 DIAGNOSIS — E782 Mixed hyperlipidemia: Secondary | ICD-10-CM | POA: Diagnosis not present

## 2019-04-28 DIAGNOSIS — E119 Type 2 diabetes mellitus without complications: Secondary | ICD-10-CM

## 2019-04-28 DIAGNOSIS — N8110 Cystocele, unspecified: Secondary | ICD-10-CM | POA: Diagnosis not present

## 2019-04-28 LAB — URINALYSIS, ROUTINE W REFLEX MICROSCOPIC
Bacteria, UA: NONE SEEN /HPF
Bilirubin Urine: NEGATIVE
Glucose, UA: NEGATIVE
Hyaline Cast: NONE SEEN /LPF
Ketones, ur: NEGATIVE
Leukocytes,Ua: NEGATIVE
Nitrite: NEGATIVE
Protein, ur: NEGATIVE
Specific Gravity, Urine: 1.025 (ref 1.001–1.03)
WBC, UA: NONE SEEN /HPF (ref 0–5)
pH: 6 (ref 5.0–8.0)

## 2019-04-28 LAB — WET PREP FOR TRICH, YEAST, CLUE

## 2019-04-28 LAB — MICROSCOPIC MESSAGE

## 2019-04-28 MED ORDER — BLOOD GLUCOSE METER KIT: PACK | 0 refills | Status: AC

## 2019-04-28 NOTE — Assessment & Plan Note (Signed)
Continue statin drug   

## 2019-04-28 NOTE — Progress Notes (Signed)
   Subjective:    Patient ID: Nina Olson, female    DOB: [birth year], 74 y.o.   MRN: [id number]  Patient presents for Follow-up (is fasting) and Vaginal Irritation (slight itching and pressure) Patient here to follow-up chronic medical problems.  Medications reviewed HTN- losartan and HCTZ taking without difficulty   Hyperlipidemia- taking lipitor as  prescribed, no SE with medication    taking supplements as prescribed   Diabetes mellitus.  Her last A1c was 6.8% when she converted to being diabetic in October 2020.  Prior to that she was borderline diabetic for some time.  She was prescribed glucometer but never receieved from pharmacy  Her fasting blood sugars range she was not started on any medication at that time.  She is due for recheck today.  Vaginal itching/ irritation intermittantly- , no vaginal bleeding , feels like something is there all the time that is uncomfortable She does leak urine especially with cough or sneezing Denies dysyria symptoms, no change in bowels  Review Of Systems:  GEN- denies fatigue, fever, weight loss,weakness, recent illness HEENT- denies eye drainage, change in vision, nasal discharge, CVS- denies chest pain, palpitations RESP- denies SOB, cough, wheeze ABD- denies N/V, change in stools, abd pain GU- denies dysuria, hematuria, dribbling, incontinence MSK- denies joint pain, muscle aches, injury Neuro- denies headache, dizziness, syncope, seizure activity       Objective:    BP 124/68   Pulse 80   Temp 98.4 F (36.9 C) (Temporal)   Resp 16   Ht 5\' 6"  (1.676 m)   Wt 170 lb (77.1 kg)   SpO2 98%   BMI 27.44 kg/m  GEN- NAD, alert and oriented x3 HEENT- PERRL, EOMI, non injected sclera, pink conjunctiva, MMM, oropharynx clear Neck- Supple, no thyromegaly CVS- RRR, no murmur RESP-CTAB ABD-NABS,soft,NT,ND GU- normal external genitalia,vaginal atrophy moist, cervix visualized no growth, no blood form os, yellow discharge, ovaries not  palpated Bladder prolpase noted EXT- No edema Pulses- Radial, DP- 2+        Assessment & Plan:      Problem List Items Addressed This Visit      Unprioritized   Bladder prolapse, female, acquired    Prolapse bladder leading to some stress incontinence Will plan to send to GYN for evaluation      Diabetes mellitus type 2, uncomplicated (HCC)    Given script in hand for glucometer, check CBG fasting Goal less than 7%      Relevant Orders   Lipid panel   Hemoglobin A1c   Microalbumin / creatinine urine ratio   [facility] DIABETES FOOT EXAM (Completed)   Essential hypertension - Primary    Well controlled no changes       Relevant Orders   Comprehensive metabolic panel   CBC with Differential/Platelet   Hyperlipidemia    Continue statin drug       Relevant Orders   Lipid panel    Other Visit Diagnoses    Vaginal discomfort       Relevant Orders   WET PREP FOR TRICH, YEAST, CLUE   Urinalysis, Routine w reflex microscopic   Stress incontinence       Relevant Orders   Urinalysis, Routine w reflex microscopic      Note: This dictation was prepared with Dragon dictation along with smaller phrase technology. Any transcriptional errors that result from this process are unintentional.

## 2019-04-28 NOTE — Assessment & Plan Note (Signed)
Well controlled no changes 

## 2019-04-28 NOTE — Assessment & Plan Note (Signed)
Given script in hand for glucometer, check CBG fasting Goal less than 7%

## 2019-04-28 NOTE — Patient Instructions (Signed)
Schedule your mammogram and bone Density We will call with lab results F/U 4 months

## 2019-04-28 NOTE — Assessment & Plan Note (Signed)
Prolapse bladder leading to some stress incontinence Will plan to send to GYN for evaluation

## 2019-04-29 LAB — CBC WITH DIFFERENTIAL/PLATELET
Absolute Monocytes: 608 cells/uL (ref 200–950)
Basophils Absolute: 32 cells/uL (ref 0–200)
Basophils Relative: 0.7 %
Eosinophils Absolute: 356 cells/uL (ref 15–500)
Eosinophils Relative: 7.9 %
HCT: 40.5 % (ref 35.0–45.0)
Hemoglobin: 13.2 g/dL (ref 11.7–15.5)
Lymphs Abs: 1512 cells/uL (ref 850–3900)
MCH: 28 pg (ref 27.0–33.0)
MCHC: 32.6 g/dL (ref 32.0–36.0)
MCV: 85.8 fL (ref 80.0–100.0)
MPV: 9.9 fL (ref 7.5–12.5)
Monocytes Relative: 13.5 %
Neutro Abs: 1994 cells/uL (ref 1500–7800)
Neutrophils Relative %: 44.3 %
Platelets: 283 10*3/uL (ref 140–400)
RBC: 4.72 10*6/uL (ref 3.80–5.10)
RDW: 13.1 % (ref 11.0–15.0)
Total Lymphocyte: 33.6 %
WBC: 4.5 10*3/uL (ref 3.8–10.8)

## 2019-04-29 LAB — HEMOGLOBIN A1C
Hgb A1c MFr Bld: 6.6 % of total Hgb — ABNORMAL HIGH (ref <5.7)
Mean Plasma Glucose: 143 (calc)
eAG (mmol/L): 7.9 (calc)

## 2019-04-29 LAB — COMPREHENSIVE METABOLIC PANEL
AG Ratio: 1.3 (calc) (ref 1.0–2.5)
ALT: 17 U/L (ref 6–29)
AST: 19 U/L (ref 10–35)
Albumin: 4.1 g/dL (ref 3.6–5.1)
Alkaline phosphatase (APISO): 97 U/L (ref 37–153)
BUN/Creatinine Ratio: 10 (calc) (ref 6–22)
BUN: 11 mg/dL (ref 7–25)
CO2: 30 mmol/L (ref 20–32)
Calcium: 9.7 mg/dL (ref 8.6–10.4)
Chloride: 103 mmol/L (ref 98–110)
Creat: 1.05 mg/dL — ABNORMAL HIGH (ref 0.60–0.93)
Globulin: 3.2 g/dL (calc) (ref 1.9–3.7)
Glucose, Bld: 94 mg/dL (ref 65–99)
Potassium: 3.6 mmol/L (ref 3.5–5.3)
Sodium: 142 mmol/L (ref 135–146)
Total Bilirubin: 0.4 mg/dL (ref 0.2–1.2)
Total Protein: 7.3 g/dL (ref 6.1–8.1)

## 2019-04-29 LAB — LIPID PANEL
Cholesterol: 162 mg/dL (ref <200)
HDL: 43 mg/dL — ABNORMAL LOW (ref >=50)
LDL Cholesterol (Calc): 97 mg/dL (calc)
Non-HDL Cholesterol (Calc): 119 mg/dL (calc) (ref <130)
Total CHOL/HDL Ratio: 3.8 (calc) (ref <5.0)
Triglycerides: 128 mg/dL (ref <150)

## 2019-04-29 LAB — MICROALBUMIN / CREATININE URINE RATIO
Creatinine, Urine: 257 mg/dL (ref 20–275)
Microalb Creat Ratio: 3 mcg/mg creat (ref <30)
Microalb, Ur: 0.7 mg/dL

## 2019-05-01 NOTE — Progress Notes (Signed)
Patient is aware of lab results. She verbalized understanding of taking her blood sugar 3 times a week fasting.

## 2019-05-07 ENCOUNTER — Other Ambulatory Visit: Payer: Self-pay

## 2019-05-07 ENCOUNTER — Ambulatory Visit: Payer: Medicare HMO | Admitting: Podiatry

## 2019-05-07 DIAGNOSIS — G588 Other specified mononeuropathies: Secondary | ICD-10-CM

## 2019-05-07 NOTE — Progress Notes (Signed)
  Subjective:  Patient ID: Nina Olson, female    DOB: [birth year],  MRN: [id number]  Chief Complaint  Patient presents with  . Neuroma    Pt states she has not noticed much of a difference since last visit injections but previous injections have helped.    74 y.o. female presents with the above complaint. History confirmed with patient.   Objective:  Physical Exam: warm, good capillary refill, no trophic changes or ulcerative lesions, normal DP and PT pulses and normal sensory exam. Left Foot: tenderness between the 2nd and 3rd metatarsal head  Right Foot: tenderness between the 2nd and 3rd metatarsal head   Assessment:   1. Interdigital neuroma    Plan:  Patient was evaluated and treated and all questions answered.  Interdigital Neuroma -Injection delivered to the affected interspaces. Injection #2. Hold off injection 3rd interspace today, not as tender.  Procedure: Sclerosing Nerve Injection Location: Bilateral 2nd  interspace Skin Prep: Alcohol. Injectate: 1.5 cc 4% sclerosing alcohol injection Disposition: Patient tolerated procedure well. Injection site dressed with a band-aid.  Return in about 2 weeks (around 05/21/2019).

## 2019-05-22 ENCOUNTER — Ambulatory Visit: Payer: Medicare HMO | Admitting: Podiatry

## 2019-05-22 ENCOUNTER — Other Ambulatory Visit: Payer: Self-pay

## 2019-05-22 DIAGNOSIS — G588 Other specified mononeuropathies: Secondary | ICD-10-CM

## 2019-05-22 DIAGNOSIS — L600 Ingrowing nail: Secondary | ICD-10-CM | POA: Diagnosis not present

## 2019-05-22 DIAGNOSIS — M79676 Pain in unspecified toe(s): Secondary | ICD-10-CM | POA: Diagnosis not present

## 2019-05-22 DIAGNOSIS — G5763 Lesion of plantar nerve, bilateral lower limbs: Secondary | ICD-10-CM | POA: Diagnosis not present

## 2019-05-22 NOTE — Progress Notes (Signed)
  Subjective:  Patient ID: Nina Olson, female    DOB: [birth year],  MRN: [id number]  No chief complaint on file.  74 y.o. female presents with the above complaint. States the feet are doing much better, left improved more than right. Still feels like she is walking on something  New complains of 5 day history sore right great toenail.  Objective:  Physical Exam: warm, good capillary refill, no trophic changes or ulcerative lesions, normal DP and PT pulses and normal sensory exam. Left Foot: tenderness between the 2nd and 3rd metatarsal head  Right Foot: tenderness between the 2nd and 3rd metatarsal head, right medial hallux ingrown nail  Assessment:   1. Interdigital neuroma   2. Ingrown nail   3. Pain around toenail    Plan:  Patient was evaluated and treated and all questions answered.  Interdigital Neuroma -Sclerosing injection #3 today.  Procedure: Neurolysis Location: Bilateral 3rd interspace Skin Prep: Alcohol. Injectate: 4% alcohol sclerosing injection 1.5 cc. Disposition: Patient tolerated procedure well. Injection site dressed with a band-aid.  Ingrown Nail Right Hallux -Debrided in slant-back fashion  Return in about 2 weeks (around 06/05/2019) for sclerosing injection bilat, f/u ingrown right.

## 2019-06-03 ENCOUNTER — Other Ambulatory Visit: Payer: Self-pay | Admitting: Family Medicine

## 2019-06-05 ENCOUNTER — Ambulatory Visit: Payer: Medicare HMO | Admitting: Podiatry

## 2019-06-05 ENCOUNTER — Other Ambulatory Visit: Payer: Self-pay

## 2019-06-05 DIAGNOSIS — G588 Other specified mononeuropathies: Secondary | ICD-10-CM

## 2019-06-05 DIAGNOSIS — G5763 Lesion of plantar nerve, bilateral lower limbs: Secondary | ICD-10-CM | POA: Diagnosis not present

## 2019-06-05 DIAGNOSIS — M79676 Pain in unspecified toe(s): Secondary | ICD-10-CM | POA: Diagnosis not present

## 2019-06-05 DIAGNOSIS — L600 Ingrowing nail: Secondary | ICD-10-CM

## 2019-06-05 NOTE — Progress Notes (Signed)
  Subjective:  Patient ID: Nina Olson, female    DOB: May 08, 1945,  MRN: [id number]  Chief Complaint  Patient presents with  . Neuroma    Pt states left is better than right, but both are gradually improving.  . Nail Problem    Right 1st ingrown follow up. Pt states healing well no concerns and feeling better.   74 y.o. female presents with the above complaint.   Objective:  Physical Exam: warm, good capillary refill, no trophic changes or ulcerative lesions, normal DP and PT pulses and normal sensory exam. Left Foot: tenderness between the 2nd and 3rd metatarsal head  Right Foot: tenderness between the 2nd and 3rd metatarsal head, no residual ingrown nail right  Assessment:   1. Interdigital neuroma   2. Ingrown nail   3. Pain around toenail    Plan:  Patient was evaluated and treated and all questions answered.  Interdigital Neuroma -Sclerosing injection #4 today.  Procedure: Neurolysis Location: Bilateral 3rd interspace Skin Prep: Alcohol. Injectate: 4% alcohol sclerosing injection. Disposition: Patient tolerated procedure well. Injection site dressed with a band-aid.   Ingrown Nail Right Hallux -Well healed  Return in about 2 weeks (around 06/19/2019).

## 2019-06-18 ENCOUNTER — Ambulatory Visit: Payer: Medicare HMO | Admitting: Podiatry

## 2019-06-18 ENCOUNTER — Other Ambulatory Visit: Payer: Self-pay

## 2019-06-18 DIAGNOSIS — G588 Other specified mononeuropathies: Secondary | ICD-10-CM | POA: Diagnosis not present

## 2019-06-18 NOTE — Progress Notes (Signed)
  Subjective:  Patient ID: Nina Olson, female    DOB: 1945-09-08,  MRN: [id number]  Chief Complaint  Patient presents with  . Neuroma    Pt states sclerosing injections have been helpful, she is still having the sensation of walking on something but no longer has pain.   74 y.o. female presents with the above complaint.   Objective:  Physical Exam: warm, good capillary refill, no trophic changes or ulcerative lesions, normal DP and PT pulses and normal sensory exam. Left Foot: tenderness between the 2nd and 3rd metatarsal head  Right Foot: tenderness between the 2nd and 3rd metatarsal head, no residual ingrown nail right  Assessment:   1. Interdigital neuroma    Plan:  Patient was evaluated and treated and all questions answered.  Interdigital Neuroma -Sclerosing injection #5 today.  Procedure: Neurolysis Location: Bilateral 3rd interspace Skin Prep: Alcohol. Injectate: 4% alcohol sclerosing injection. Disposition: Patient tolerated procedure well. Injection site dressed with a band-aid.  Ingrown Nail Right Hallux -Well healed  No follow-ups on file.

## 2019-07-09 ENCOUNTER — Ambulatory Visit: Payer: Medicare HMO | Admitting: Podiatry

## 2019-07-09 ENCOUNTER — Other Ambulatory Visit: Payer: Self-pay

## 2019-07-09 DIAGNOSIS — G588 Other specified mononeuropathies: Secondary | ICD-10-CM

## 2019-07-09 NOTE — Progress Notes (Signed)
  Subjective:  Patient ID: Nina Olson, female    DOB: 01/10/1946,  MRN: [id number]  Chief Complaint  Patient presents with  . Toe Pain    Right 1st digit occasional pain, no pain currently.  Marland Kitchen Neuroma    Pt states left neuroma is tender to touch, right neuroma is numb/tingling. Pt states still feels like stepping on something.   74 y.o. female presents with the above complaint.   Objective:  Physical Exam: warm, good capillary refill, no trophic changes or ulcerative lesions, normal DP and PT pulses and normal sensory exam. Left Foot: tenderness between the 3rd and 4th metatarsal head  Right Foot: tenderness between the 3rd and 4th metatarsal head, no residual ingrown nail right  Assessment:   1. Interdigital neuroma    Plan:  Patient was evaluated and treated and all questions answered.  Interdigital Neuroma -Sclerosing injection #6 today.  Procedure: Neurolysis Location: Bilateral 3rd interspace Skin Prep: Alcohol. Injectate: 4% alcohol sclerosing injection. Disposition: Patient tolerated procedure well. Injection site dressed with a band-aid.  Return in about 2 weeks (around 07/23/2019) for Neuroma, Bilateral.

## 2019-07-15 ENCOUNTER — Other Ambulatory Visit: Payer: Self-pay | Admitting: Family Medicine

## 2019-07-23 ENCOUNTER — Other Ambulatory Visit: Payer: Self-pay

## 2019-07-23 ENCOUNTER — Ambulatory Visit: Payer: Medicare HMO | Admitting: Podiatry

## 2019-07-23 DIAGNOSIS — G5763 Lesion of plantar nerve, bilateral lower limbs: Secondary | ICD-10-CM

## 2019-08-06 ENCOUNTER — Ambulatory Visit: Payer: Medicare HMO | Admitting: Podiatry

## 2019-08-06 ENCOUNTER — Other Ambulatory Visit: Payer: Self-pay

## 2019-08-06 VITALS — Temp 98.0°F

## 2019-08-06 DIAGNOSIS — G588 Other specified mononeuropathies: Secondary | ICD-10-CM

## 2019-08-06 NOTE — Progress Notes (Signed)
  Subjective:  Patient ID: Nina Olson, female    DOB: January 16, 1946,  MRN: [id number]  Chief Complaint  Patient presents with  . Neuroma    R foot. Pt stated, "It's better. No pain - just some tenderness and numbness".   74 y.o. female presents with the above complaint.   Objective:  Physical Exam: warm, good capillary refill, no trophic changes or ulcerative lesions, normal DP and PT pulses and normal sensory exam. Left Foot: tenderness between the 3rd and 4th metatarsal head  Right Foot: tenderness between the 3rd and 4th metatarsal head, no residual ingrown nail right  Assessment:   1. Interdigital neuroma    Plan:  Patient was evaluated and treated and all questions answered.  Interdigital Neuroma -Sclerosing injection #7 today.  Procedure: Neurolysis Location: Bilateral 3rd interspace Skin Prep: Alcohol. Injectate: 4% alcohol sclerosing injection. Disposition: Patient tolerated procedure well. Injection site dressed with a band-aid.   Return if symptoms worsen or fail to improve.

## 2019-08-17 ENCOUNTER — Other Ambulatory Visit: Payer: Self-pay | Admitting: Family Medicine

## 2019-08-28 ENCOUNTER — Encounter: Payer: Self-pay | Admitting: Family Medicine

## 2019-08-28 ENCOUNTER — Other Ambulatory Visit: Payer: Self-pay

## 2019-08-28 ENCOUNTER — Ambulatory Visit (INDEPENDENT_AMBULATORY_CARE_PROVIDER_SITE_OTHER): Payer: Medicare HMO | Admitting: Family Medicine

## 2019-08-28 VITALS — BP 128/64 | HR 100 | Temp 98.2°F | Resp 16 | Ht 66.0 in | Wt 165.0 lb

## 2019-08-28 DIAGNOSIS — F5105 Insomnia due to other mental disorder: Secondary | ICD-10-CM

## 2019-08-28 DIAGNOSIS — F418 Other specified anxiety disorders: Secondary | ICD-10-CM

## 2019-08-28 DIAGNOSIS — I10 Essential (primary) hypertension: Secondary | ICD-10-CM

## 2019-08-28 DIAGNOSIS — E119 Type 2 diabetes mellitus without complications: Secondary | ICD-10-CM

## 2019-08-28 DIAGNOSIS — M8589 Other specified disorders of bone density and structure, multiple sites: Secondary | ICD-10-CM

## 2019-08-28 DIAGNOSIS — E782 Mixed hyperlipidemia: Secondary | ICD-10-CM | POA: Diagnosis not present

## 2019-08-28 DIAGNOSIS — R634 Abnormal weight loss: Secondary | ICD-10-CM | POA: Diagnosis not present

## 2019-08-28 DIAGNOSIS — Z1231 Encounter for screening mammogram for malignant neoplasm of breast: Secondary | ICD-10-CM | POA: Diagnosis not present

## 2019-08-28 MED ORDER — MIRTAZAPINE 15 MG PO TABS
7.5000 mg | ORAL_TABLET | Freq: Every day | ORAL | 1 refills | Status: DC
Start: 2019-08-28 — End: 2019-11-06

## 2019-08-28 NOTE — Assessment & Plan Note (Signed)
Trial of remeron 7.5mg  at bedtime for sleep/mood and appetite

## 2019-08-28 NOTE — Patient Instructions (Signed)
Schedule your mammogram and bone density on the same day We will call with lab results Try the sleep/appetite pill take 1/2 at bedtime  F/U 2 months for recheck

## 2019-08-28 NOTE — Assessment & Plan Note (Signed)
Controlled no changes 

## 2019-08-28 NOTE — Progress Notes (Signed)
Subjective:    Patient ID: Nina Olson, female    DOB: [birth year], 74 y.o.   MRN: [id number]  Patient presents for Follow-up (is fasting) and R Side Pain (intermittent pain- is not hurting now)  Pt here to f/u chronic medical problems  She had a pain on right side for a few days then went away. No pain today  no urinary symptoms, no change in bowels movements    She does get gas pains     No vomiting or nausea    Occ coughs, gets a tickle in throat and coughs     DM- last A1C 6.6% in Jan, she typically checks 3-4  times a week  her fastings last week were  104/103 , highest  126    Weigh down 5lbs since Jan, she is not doing anything to loose weight, she has tried cutting down sugars  She walks the dog for 30-40 minutes  HTN- taking BP meds as prescribed   She doesn't sleep well- wakes up in the middle of the night and cant get  Back sleep   she does take tylenol PM some nights  She does have some stressors  She states she felt "funny with trazodone" given in 2017  She has been discharged from podiatry   Dr Nancy Fetter is eye doctor   Review Of Systems:  GEN- denies fatigue, fever, weight loss,weakness, recent illness HEENT- denies eye drainage, change in vision, nasal discharge, CVS- denies chest pain, palpitations RESP- denies SOB, cough, wheeze ABD- denies N/V, change in stools, abd pain GU- denies dysuria, hematuria, dribbling, incontinence MSK- denies joint pain, muscle aches, injury Neuro- denies headache, dizziness, syncope, seizure activity       Objective:    BP 128/64   Pulse 100   Temp 98.2 F (36.8 C) (Temporal)   Resp 16   Ht 5\' 6"  (1.676 m)   Wt 165 lb (74.8 kg)   SpO2 98%   BMI 26.63 kg/m  GEN- NAD, alert and oriented x3 HEENT- PERRL, EOMI, non injected sclera, pink conjunctiva, MMM, oropharynx clear Neck- Supple, no thyromegaly CVS- RRR, no murmur RESP-CTAB ABD-NABS,soft,NT,ND, no CVA tenderness Psych- normal affect and mood  EXT- No  edema Pulses- Radial, DP- 2+        Assessment & Plan:      Problem List Items Addressed This Visit      Unprioritized   Diabetes mellitus type 2, uncomplicated (HCC)    Diet controlled, recheck A1C goal  < 7%       Relevant Orders   Hemoglobin A1c   Essential hypertension - Primary    Controlled no changes       Relevant Orders   CBC with Differential/Platelet   Comprehensive metabolic panel   TSH   Hyperlipidemia   Insomnia secondary to depression with anxiety    Trial of remeron 7.5mg  at bedtime for sleep/mood and appetite       Relevant Medications   mirtazapine (REMERON) 15 MG tablet   Osteopenia    In setting of weight loss Will check mammogram for breast cancer screen Recent colonoscopy obtain from GI Benign exam Check labs  Will also proceed with bone density , pt on calcium/vitamin D       Relevant Orders   DG Bone Density    Other Visit Diagnoses    Encounter for screening mammogram for malignant neoplasm of breast       Relevant Orders   MM 3D SCREEN BREAST  BILATERAL   Weight loss       Relevant Orders   TSH      Note: This dictation was prepared with Dragon dictation along with smaller phrase technology. Any transcriptional errors that result from this process are unintentional.

## 2019-08-28 NOTE — Assessment & Plan Note (Signed)
Diet controlled, recheck A1C goal  < 7%

## 2019-08-28 NOTE — Assessment & Plan Note (Signed)
In setting of weight loss Will check mammogram for breast cancer screen Recent colonoscopy obtain from GI Benign exam Check labs  Will also proceed with bone density , pt on calcium/vitamin D

## 2019-08-29 LAB — HEMOGLOBIN A1C
Hgb A1c MFr Bld: 6.2 % of total Hgb — ABNORMAL HIGH (ref <5.7)
Mean Plasma Glucose: 131 (calc)
eAG (mmol/L): 7.3 (calc)

## 2019-08-29 LAB — CBC WITH DIFFERENTIAL/PLATELET
Absolute Monocytes: 562 cells/uL (ref 200–950)
Basophils Absolute: 41 cells/uL (ref 0–200)
Basophils Relative: 1.1 %
Eosinophils Absolute: 292 cells/uL (ref 15–500)
Eosinophils Relative: 7.9 %
HCT: 38.3 % (ref 35.0–45.0)
Hemoglobin: 12.6 g/dL (ref 11.7–15.5)
Lymphs Abs: 1284 cells/uL (ref 850–3900)
MCH: 28.4 pg (ref 27.0–33.0)
MCHC: 32.9 g/dL (ref 32.0–36.0)
MCV: 86.3 fL (ref 80.0–100.0)
MPV: 9.2 fL (ref 7.5–12.5)
Monocytes Relative: 15.2 %
Neutro Abs: 1521 cells/uL (ref 1500–7800)
Neutrophils Relative %: 41.1 %
Platelets: 260 10*3/uL (ref 140–400)
RBC: 4.44 10*6/uL (ref 3.80–5.10)
RDW: 13.2 % (ref 11.0–15.0)
Total Lymphocyte: 34.7 %
WBC: 3.7 10*3/uL — ABNORMAL LOW (ref 3.8–10.8)

## 2019-08-29 LAB — COMPREHENSIVE METABOLIC PANEL
AG Ratio: 1.6 (calc) (ref 1.0–2.5)
ALT: 14 U/L (ref 6–29)
AST: 16 U/L (ref 10–35)
Albumin: 4.1 g/dL (ref 3.6–5.1)
Alkaline phosphatase (APISO): 96 U/L (ref 37–153)
BUN/Creatinine Ratio: 11 (calc) (ref 6–22)
BUN: 11 mg/dL (ref 7–25)
CO2: 26 mmol/L (ref 20–32)
Calcium: 9.3 mg/dL (ref 8.6–10.4)
Chloride: 106 mmol/L (ref 98–110)
Creat: 0.99 mg/dL — ABNORMAL HIGH (ref 0.60–0.93)
Globulin: 2.6 g/dL (calc) (ref 1.9–3.7)
Glucose, Bld: 91 mg/dL (ref 65–99)
Potassium: 3.3 mmol/L — ABNORMAL LOW (ref 3.5–5.3)
Sodium: 143 mmol/L (ref 135–146)
Total Bilirubin: 0.5 mg/dL (ref 0.2–1.2)
Total Protein: 6.7 g/dL (ref 6.1–8.1)

## 2019-08-29 LAB — TSH: TSH: 0.61 mIU/L (ref 0.40–4.50)

## 2019-09-01 ENCOUNTER — Other Ambulatory Visit: Payer: Self-pay | Admitting: *Deleted

## 2019-09-01 DIAGNOSIS — E876 Hypokalemia: Secondary | ICD-10-CM

## 2019-09-01 MED ORDER — POTASSIUM CHLORIDE CRYS ER 20 MEQ PO TBCR
20.0000 meq | EXTENDED_RELEASE_TABLET | Freq: Every day | ORAL | 0 refills | Status: DC
Start: 2019-09-01 — End: 2019-11-06

## 2019-09-08 ENCOUNTER — Other Ambulatory Visit: Payer: Self-pay

## 2019-09-08 ENCOUNTER — Other Ambulatory Visit: Payer: Medicare HMO

## 2019-09-08 DIAGNOSIS — E876 Hypokalemia: Secondary | ICD-10-CM | POA: Diagnosis not present

## 2019-09-08 LAB — BASIC METABOLIC PANEL
BUN/Creatinine Ratio: 12 (calc) (ref 6–22)
BUN: 13 mg/dL (ref 7–25)
CO2: 28 mmol/L (ref 20–32)
Calcium: 9.6 mg/dL (ref 8.6–10.4)
Chloride: 103 mmol/L (ref 98–110)
Creat: 1.06 mg/dL — ABNORMAL HIGH (ref 0.60–0.93)
Glucose, Bld: 85 mg/dL (ref 65–99)
Potassium: 4.2 mmol/L (ref 3.5–5.3)
Sodium: 140 mmol/L (ref 135–146)

## 2019-09-11 ENCOUNTER — Encounter: Payer: Self-pay | Admitting: *Deleted

## 2019-09-21 NOTE — Progress Notes (Signed)
  Subjective:  Patient ID: Nina Olson, female    DOB: 14-Nov-1945,  MRN: [id number]  Chief Complaint  Patient presents with  . Neuroma    Bilateral - follow-up. Pt stated, "Pain has improved, and some of the numbness on the resolved. L better than the R. Still feels like I'm walking on something".  . Diabetes    Most recent HgbA1c = 6.6. Fasting AM glucose today per pt = 115mg /dL.   74 y.o. female presents with the above complaint.   Objective:  Physical Exam: warm, good capillary refill, no trophic changes or ulcerative lesions, normal DP and PT pulses and normal sensory exam. Left Foot: tenderness between the 3rd and 4th metatarsal head  Right Foot: tenderness between the 3rd and 4th metatarsal head, no residual ingrown nail right  Assessment:   1. Morton's neuroma of both feet    Plan:  Patient was evaluated and treated and all questions answered.  Interdigital Neuroma -Sclerosing injection #7 today.  Procedure: Neurolysis Location: Bilateral 3rd interspace Skin Prep: Alcohol. Injectate: 4% alcohol sclerosing injection. Disposition: Patient tolerated procedure well. Injection site dressed with a band-aid.   No follow-ups on file.

## 2019-09-22 ENCOUNTER — Other Ambulatory Visit: Payer: Self-pay | Admitting: *Deleted

## 2019-09-22 MED ORDER — ESOMEPRAZOLE MAGNESIUM 40 MG PO CPDR: 40.0000 mg | DELAYED_RELEASE_CAPSULE | Freq: Every day | ORAL | 3 refills | Status: DC

## 2019-10-26 ENCOUNTER — Other Ambulatory Visit: Payer: Self-pay | Admitting: Family Medicine

## 2019-10-26 DIAGNOSIS — I10 Essential (primary) hypertension: Secondary | ICD-10-CM

## 2019-10-29 ENCOUNTER — Other Ambulatory Visit: Payer: Self-pay | Admitting: Family Medicine

## 2019-11-06 ENCOUNTER — Ambulatory Visit (INDEPENDENT_AMBULATORY_CARE_PROVIDER_SITE_OTHER): Payer: Medicare HMO | Admitting: Family Medicine

## 2019-11-06 ENCOUNTER — Other Ambulatory Visit: Payer: Self-pay

## 2019-11-06 ENCOUNTER — Encounter: Payer: Self-pay | Admitting: Family Medicine

## 2019-11-06 VITALS — BP 126/64 | HR 98 | Temp 98.6°F | Resp 14 | Ht 66.0 in | Wt 168.0 lb

## 2019-11-06 DIAGNOSIS — F418 Other specified anxiety disorders: Secondary | ICD-10-CM | POA: Diagnosis not present

## 2019-11-06 DIAGNOSIS — G5761 Lesion of plantar nerve, right lower limb: Secondary | ICD-10-CM | POA: Diagnosis not present

## 2019-11-06 DIAGNOSIS — I10 Essential (primary) hypertension: Secondary | ICD-10-CM | POA: Diagnosis not present

## 2019-11-06 DIAGNOSIS — M19049 Primary osteoarthritis, unspecified hand: Secondary | ICD-10-CM | POA: Diagnosis not present

## 2019-11-06 DIAGNOSIS — M79671 Pain in right foot: Secondary | ICD-10-CM | POA: Diagnosis not present

## 2019-11-06 DIAGNOSIS — F5105 Insomnia due to other mental disorder: Secondary | ICD-10-CM | POA: Diagnosis not present

## 2019-11-06 NOTE — Assessment & Plan Note (Signed)
controlled 

## 2019-11-06 NOTE — Patient Instructions (Addendum)
Referral to podiatry  For arthritis In hands - Voltaren Gel 1% over the counter  Referral To therapist  F/U 4 months for physical

## 2019-11-06 NOTE — Assessment & Plan Note (Signed)
Weight up 3lbs Referral to therapist Will go back to melatonin as she has been off for some time now, she is sensitive to some of the other meds/anti-depressants we tried

## 2019-11-06 NOTE — Progress Notes (Signed)
   Subjective:    Patient ID: Nina Olson, female    DOB: 1945/09/22, 74 y.o.   MRN: [id number]  Patient presents for Follow-up (is fasting)   Pt here to for intermin f/u on medications  Medications reviewed    DM- last A1C 6.2% in May, she typically checks 3-4  times a week    Weigh down 5lbs since Jan AT LAST visit, this was unintentional but she admitted to stresors, just not feeling well, not sleeping well. She had a dog she was keeping for her daughter but no longer has that responsbility and she misses it She was prescribed remeron but this still made her drowsy, even when she tried to take it earlier in the day She has also tried trazodone and had SE Contemplating therapy    HTN- taking BP meds as prescribed    Continues to have issues on bottom of right foot, where she had injections fo rmortons neuroma but request a different podiatrist   Occ gests sharp achy pains into her fingertips, no swelling, also aches at joint, takes tylenol as needed     Review Of Systems:  GEN- denies fatigue, fever, weight loss,weakness, recent illness HEENT- denies eye drainage, change in vision, nasal discharge, CVS- denies chest pain, palpitations RESP- denies SOB, cough, wheeze ABD- denies N/V, change in stools, abd pain GU- denies dysuria, hematuria, dribbling, incontinence MSK- + joint pain, muscle aches, injury Neuro- denies headache, dizziness, syncope, seizure activity       Objective:    BP (!) 126/64   Pulse 98   Temp 98.6 F (37 C) (Temporal)   Resp 14   Ht 5\' 6"  (1.676 m)   Wt 168 lb (76.2 kg)   SpO2 100%   BMI 27.12 kg/m  GEN- NAD, alert and oriented x3 HEENT- PERRL, EOMI, non injected sclera, pink conjunctiva, MMM, oropharynx clear Neck- Supple, no thyromegaly CVS- RRR, no murmur RESP-CTAB ABD-NABS,soft,NT,ND MSK- bilat hands- arthritic changes at DIP/MIP Sensation intact, loose grip/fist bilat EXT- No edema, TTP Right foot beneath 2-3rd digits  Psych-  normal affect and mood , PHQ9 Score 6  Pulses- Radial, DP- 2+        Assessment & Plan:      Problem List Items Addressed This Visit      Unprioritized   Essential hypertension    controlled      Insomnia secondary to depression with anxiety - Primary    Weight up 3lbs Referral to therapist Will go back to melatonin as she has been off for some time now, she is sensitive to some of the other meds/anti-depressants we tried       Relevant Orders   Ambulatory referral to Psychology    Other Visit Diagnoses    Arthritis of hand       topical voltagen gel, tylenol   Right foot pain       podiatry referral   Relevant Orders   Ambulatory referral to Podiatry   Morton's neuralgia, right       Relevant Orders   Ambulatory referral to Podiatry   Depression with anxiety       Relevant Orders   Ambulatory referral to Psychology      Note: This dictation was prepared with Dragon dictation along with smaller phrase technology. Any transcriptional errors that result from this process are unintentional.

## 2019-11-10 ENCOUNTER — Other Ambulatory Visit: Payer: Self-pay | Admitting: *Deleted

## 2019-11-10 DIAGNOSIS — I10 Essential (primary) hypertension: Secondary | ICD-10-CM

## 2019-11-10 MED ORDER — ATORVASTATIN CALCIUM 20 MG PO TABS: 20.0000 mg | ORAL_TABLET | Freq: Every day | ORAL | 3 refills | Status: DC

## 2019-11-10 MED ORDER — HYDROCHLOROTHIAZIDE 25 MG PO TABS: 25.0000 mg | ORAL_TABLET | Freq: Every day | ORAL | 3 refills | Status: DC

## 2019-11-10 MED ORDER — LOSARTAN POTASSIUM 100 MG PO TABS: 100.0000 mg | ORAL_TABLET | Freq: Every day | ORAL | 3 refills | Status: DC

## 2019-12-02 ENCOUNTER — Other Ambulatory Visit: Payer: Self-pay | Admitting: Family Medicine

## 2019-12-19 ENCOUNTER — Other Ambulatory Visit: Payer: Self-pay | Admitting: Family Medicine

## 2020-03-06 ENCOUNTER — Other Ambulatory Visit: Payer: Self-pay | Admitting: Family Medicine

## 2020-03-06 DIAGNOSIS — I10 Essential (primary) hypertension: Secondary | ICD-10-CM

## 2020-03-16 ENCOUNTER — Encounter: Payer: Self-pay | Admitting: Family Medicine

## 2020-03-16 ENCOUNTER — Ambulatory Visit (INDEPENDENT_AMBULATORY_CARE_PROVIDER_SITE_OTHER): Payer: Medicare HMO | Admitting: Family Medicine

## 2020-03-16 ENCOUNTER — Other Ambulatory Visit: Payer: Self-pay

## 2020-03-16 VITALS — BP 120/70 | HR 94 | Temp 98.6°F | Ht 65.0 in | Wt 167.0 lb

## 2020-03-16 DIAGNOSIS — Z0001 Encounter for general adult medical examination with abnormal findings: Secondary | ICD-10-CM

## 2020-03-16 DIAGNOSIS — E119 Type 2 diabetes mellitus without complications: Secondary | ICD-10-CM

## 2020-03-16 DIAGNOSIS — H6121 Impacted cerumen, right ear: Secondary | ICD-10-CM | POA: Diagnosis not present

## 2020-03-16 DIAGNOSIS — F5105 Insomnia due to other mental disorder: Secondary | ICD-10-CM | POA: Diagnosis not present

## 2020-03-16 DIAGNOSIS — E782 Mixed hyperlipidemia: Secondary | ICD-10-CM | POA: Diagnosis not present

## 2020-03-16 DIAGNOSIS — F418 Other specified anxiety disorders: Secondary | ICD-10-CM | POA: Diagnosis not present

## 2020-03-16 DIAGNOSIS — M8589 Other specified disorders of bone density and structure, multiple sites: Secondary | ICD-10-CM

## 2020-03-16 DIAGNOSIS — Z0000 Encounter for general adult medical examination without abnormal findings: Secondary | ICD-10-CM

## 2020-03-16 DIAGNOSIS — Z1231 Encounter for screening mammogram for malignant neoplasm of breast: Secondary | ICD-10-CM | POA: Diagnosis not present

## 2020-03-16 DIAGNOSIS — I10 Essential (primary) hypertension: Secondary | ICD-10-CM | POA: Diagnosis not present

## 2020-03-16 NOTE — Progress Notes (Signed)
Subjective:   Patient presents for Medicare Annual/Subsequent preventive examination.   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     She has been follo    Hyperlipidemia- taking lipitor   HTN- taking BP meds without difficulty   History of iron def anemia- she takes Iron tablets randomly   Insomnia and MDD/GAD- she has been doing some counseling [facility] but wants another provider to continue therapy  DM - Due for repeat A1C last  6.2%, CBG   diet controlled  Review Past Medical/Family/Social: Per EMR    Risk Factors  Current exercise habits: does Yoga  Dietary issues discussed: Yes  Cardiac risk factors:  HTN, Hyperlipidemia  Depression Screen  (Note: if answer to either of the following is "Yes", a more complete depression screening is indicated)  Over the past two weeks, have you felt down, depressed or hopeless? No Over the past two weeks, have you felt little interest or pleasure in doing things? No Have you lost interest or pleasure in daily life? No Do you often feel hopeless? No Do you cry easily over simple problems? No   Activities of Daily Living  In your present state of health, do you have any difficulty performing the following activities?:  Driving? No  Managing money? No  Feeding yourself? No  Getting from bed to chair? No  Climbing a flight of stairs? No  Preparing food and eating?: No  Bathing or showering? No  Getting dressed: No  Getting to the toilet? No  Using the toilet:No  Moving around from place to place: No  In the past year have you fallen or had a near fall?:No  Are you sexually active? No  Do you have more than one partner? No   Hearing Difficulties: No  Do you often ask people to speak up or repeat themselves? No  Do you experience ringing or noises in your ears? No Do you have difficulty  understanding soft or whispered voices? No  Do you feel that you have a problem with memory? No Do you often misplace items? No  Do you feel safe at home? Yes  Cognitive Testing  Alert? Yes Normal Appearance?Yes  Oriented to person? Yes Place? Yes  Time? Yes  Recall of three objects? Yes  Can perform simple calculations? Yes  Displays appropriate judgment?Yes  Can read the correct time from a watch face?Yes   List the Names of Other Physician/Practitioners you currently use:   [place] [facility]  [facility]  Orthopedics   Dr. March Rummage- Podiatry     Screening Tests / Date Colonoscopy   Due                   Zostavax  UTD Pneumonia- UTD  Mammogram Due  Influenza Vaccine  UTD  Tetanus/tdap UTD Bone Density DUE  COVID-19 UTD including booster  ROS: GEN- denies fatigue, fever, weight loss,weakness, recent illness HEENT- denies eye drainage, change in vision, nasal discharge, CVS- denies chest pain, palpitations RESP- denies SOB, cough, wheeze ABD- denies N/V, change in stools, abd pain GU- denies dysuria, hematuria, dribbling, incontinence MSK- + joint pain, muscle aches, injury Neuro- denies headache, dizziness, syncope, seizure activity  Physical : vitals reviewed GEN- NAD, alert and oriented x3 HEENT- PERRL, EOMI, non injected sclera, pink conjunctiva, MMM, oropharynx clear  Neck- Supple, no thryomegaly, NO Bruit CVS- RRR, no murmur RESP-CTAB And-nabs,SOFT,nt,nd Psych normal affect  EXT- No edema, Pulses- Radial, DP- 2+    Assessment:    Annual wellness medicare exam   Plan:    During the course of the visit the patient was educated and counseled about appropriate screening and preventive services including:    Colon cancer screeing- due history of polyps   Mammogram - Pt to schedule   Bone Density - Pt to schedule   Neuroma f/u podiatry has had injections   HTn- no change to BP meds  MDD/GAD/insomnia has tried  multipel meds, referred to new psychotherapist   Hyperlipidemia- recheck lipids, no change to meds   Osteopenia- Bone density, continue weight bearing exercise, calcium and Vitamin D   DM- recheck A1C, diet controlled  Shingles vaccine from pharmacy   Audit C/Fall  screen negative  Diet review for nutrition referral? Yes ____ Not Indicated __x__  Patient Instructions (the written plan) was given to the patient.  Medicare Attestation  I have personally reviewed:  The patient's medical and social history  Their use of alcohol, tobacco or illicit drugs  Their current medications and supplements  The patient's functional ability including ADLs,fall risks, home safety risks, cognitive, and hearing and visual impairment  Diet and physical activities  Evidence for depression or mood disorders  The patient's weight, height, BMI, and visual acuity have been recorded in the chart. I have made referrals, counseling, and provided education to the patient based on review of the above and I have provided the patient with a written personalized care plan for preventive services.

## 2020-03-16 NOTE — Patient Instructions (Addendum)
Referral to therapy  [facility] [address]- call to schedule Mammogram and Bone Density  Check on shingles vaccine at pharmacy F/U 4 months

## 2020-03-17 ENCOUNTER — Telehealth: Payer: Self-pay | Admitting: *Deleted

## 2020-03-17 LAB — CBC WITH DIFFERENTIAL/PLATELET
Absolute Monocytes: 815 cells/uL (ref 200–950)
Basophils Absolute: 32 cells/uL (ref 0–200)
Basophils Relative: 0.7 %
Eosinophils Absolute: 198 cells/uL (ref 15–500)
Eosinophils Relative: 4.4 %
HCT: 38.7 % (ref 35.0–45.0)
Hemoglobin: 13 g/dL (ref 11.7–15.5)
Lymphs Abs: 1098 cells/uL (ref 850–3900)
MCH: 28.6 pg (ref 27.0–33.0)
MCHC: 33.6 g/dL (ref 32.0–36.0)
MCV: 85.2 fL (ref 80.0–100.0)
MPV: 9.3 fL (ref 7.5–12.5)
Monocytes Relative: 18.1 %
Neutro Abs: 2358 cells/uL (ref 1500–7800)
Neutrophils Relative %: 52.4 %
Platelets: 285 10*3/uL (ref 140–400)
RBC: 4.54 10*6/uL (ref 3.80–5.10)
RDW: 12.3 % (ref 11.0–15.0)
Total Lymphocyte: 24.4 %
WBC: 4.5 10*3/uL (ref 3.8–10.8)

## 2020-03-17 LAB — LIPID PANEL
Cholesterol: 198 mg/dL (ref <200)
HDL: 48 mg/dL — ABNORMAL LOW (ref >=50)
LDL Cholesterol (Calc): 133 mg/dL (calc) — ABNORMAL HIGH
Non-HDL Cholesterol (Calc): 150 mg/dL (calc) — ABNORMAL HIGH (ref <130)
Total CHOL/HDL Ratio: 4.1 (calc) (ref <5.0)
Triglycerides: 77 mg/dL (ref <150)

## 2020-03-17 LAB — COMPREHENSIVE METABOLIC PANEL
AG Ratio: 1.4 (calc) (ref 1.0–2.5)
ALT: 15 U/L (ref 6–29)
AST: 16 U/L (ref 10–35)
Albumin: 4.2 g/dL (ref 3.6–5.1)
Alkaline phosphatase (APISO): 106 U/L (ref 37–153)
BUN: 17 mg/dL (ref 7–25)
CO2: 28 mmol/L (ref 20–32)
Calcium: 9.6 mg/dL (ref 8.6–10.4)
Chloride: 103 mmol/L (ref 98–110)
Creat: 0.91 mg/dL (ref 0.60–0.93)
Globulin: 3.1 g/dL (calc) (ref 1.9–3.7)
Glucose, Bld: 97 mg/dL (ref 65–99)
Potassium: 3.7 mmol/L (ref 3.5–5.3)
Sodium: 139 mmol/L (ref 135–146)
Total Bilirubin: 0.7 mg/dL (ref 0.2–1.2)
Total Protein: 7.3 g/dL (ref 6.1–8.1)

## 2020-03-17 LAB — HEMOGLOBIN A1C
Hgb A1c MFr Bld: 6.4 % of total Hgb — ABNORMAL HIGH (ref <5.7)
Mean Plasma Glucose: 137 mg/dL
eAG (mmol/L): 7.6 mmol/L

## 2020-03-17 MED ORDER — SHINGRIX 50 MCG/0.5ML IM SUSR: 0.5000 mL | Freq: Once | INTRAMUSCULAR | 1 refills | Status: AC

## 2020-03-17 NOTE — Telephone Encounter (Signed)
-----   Message from Alycia Rossetti, MD sent at 03/16/2020  8:55 PM EST ----- Regarding: send shingrix to pharmacy

## 2020-03-17 NOTE — Telephone Encounter (Signed)
Prescription sent to pharmacy.

## 2020-03-20 ENCOUNTER — Other Ambulatory Visit: Payer: Self-pay | Admitting: Family Medicine

## 2020-04-21 ENCOUNTER — Other Ambulatory Visit: Payer: Self-pay | Admitting: Family Medicine

## 2020-05-20 ENCOUNTER — Other Ambulatory Visit: Payer: Self-pay

## 2020-05-20 ENCOUNTER — Telehealth (INDEPENDENT_AMBULATORY_CARE_PROVIDER_SITE_OTHER): Payer: Medicare HMO | Admitting: Nurse Practitioner

## 2020-05-21 DIAGNOSIS — Z20822 Contact with and (suspected) exposure to covid-19: Secondary | ICD-10-CM | POA: Diagnosis not present

## 2020-05-21 DIAGNOSIS — Z03818 Encounter for observation for suspected exposure to other biological agents ruled out: Secondary | ICD-10-CM | POA: Diagnosis not present

## 2020-05-21 NOTE — Progress Notes (Signed)
Appt cancelled by patient

## 2020-05-29 ENCOUNTER — Other Ambulatory Visit: Payer: Self-pay | Admitting: Family Medicine

## 2020-06-30 ENCOUNTER — Other Ambulatory Visit: Payer: Self-pay | Admitting: Family Medicine

## 2020-07-15 ENCOUNTER — Encounter: Payer: Self-pay | Admitting: Nurse Practitioner

## 2020-07-15 ENCOUNTER — Other Ambulatory Visit: Payer: Self-pay

## 2020-07-15 ENCOUNTER — Ambulatory Visit (INDEPENDENT_AMBULATORY_CARE_PROVIDER_SITE_OTHER): Payer: Medicare HMO | Admitting: Nurse Practitioner

## 2020-07-15 VITALS — BP 152/98 | HR 61 | Temp 97.0°F | Ht 65.0 in | Wt 173.0 lb

## 2020-07-15 DIAGNOSIS — R1084 Generalized abdominal pain: Secondary | ICD-10-CM | POA: Insufficient documentation

## 2020-07-15 DIAGNOSIS — E782 Mixed hyperlipidemia: Secondary | ICD-10-CM

## 2020-07-15 DIAGNOSIS — E559 Vitamin D deficiency, unspecified: Secondary | ICD-10-CM | POA: Diagnosis not present

## 2020-07-15 DIAGNOSIS — M8589 Other specified disorders of bone density and structure, multiple sites: Secondary | ICD-10-CM | POA: Diagnosis not present

## 2020-07-15 DIAGNOSIS — R140 Abdominal distension (gaseous): Secondary | ICD-10-CM | POA: Insufficient documentation

## 2020-07-15 DIAGNOSIS — Z1211 Encounter for screening for malignant neoplasm of colon: Secondary | ICD-10-CM | POA: Insufficient documentation

## 2020-07-15 DIAGNOSIS — Z860100 Personal history of colon polyps, unspecified: Secondary | ICD-10-CM | POA: Insufficient documentation

## 2020-07-15 DIAGNOSIS — R195 Other fecal abnormalities: Secondary | ICD-10-CM | POA: Insufficient documentation

## 2020-07-15 DIAGNOSIS — K219 Gastro-esophageal reflux disease without esophagitis: Secondary | ICD-10-CM | POA: Diagnosis not present

## 2020-07-15 DIAGNOSIS — L290 Pruritus ani: Secondary | ICD-10-CM | POA: Insufficient documentation

## 2020-07-15 DIAGNOSIS — I10 Essential (primary) hypertension: Secondary | ICD-10-CM

## 2020-07-15 DIAGNOSIS — A609 Anogenital herpesviral infection, unspecified: Secondary | ICD-10-CM

## 2020-07-15 DIAGNOSIS — E1140 Type 2 diabetes mellitus with diabetic neuropathy, unspecified: Secondary | ICD-10-CM | POA: Diagnosis not present

## 2020-07-15 DIAGNOSIS — K625 Hemorrhage of anus and rectum: Secondary | ICD-10-CM | POA: Insufficient documentation

## 2020-07-15 DIAGNOSIS — Z800 Family history of malignant neoplasm of digestive organs: Secondary | ICD-10-CM | POA: Insufficient documentation

## 2020-07-15 MED ORDER — VALACYCLOVIR HCL 1 G PO TABS: 1000.0000 mg | ORAL_TABLET | Freq: Every day | ORAL | 0 refills | Status: DC

## 2020-07-15 MED ORDER — GABAPENTIN 100 MG PO CAPS
100.0000 mg | ORAL_CAPSULE | Freq: Every day | ORAL | 0 refills | Status: DC
Start: 2020-07-15 — End: 2020-08-15

## 2020-07-15 NOTE — Assessment & Plan Note (Addendum)
Last bone density scan was in 2015 and showed improvement in osteopenia.  Discussed fall precautions.  Scheduled for recheck of bone density in June 2022.  Continue vitamin D supplementation for now.  Will check vitamin D level today.

## 2020-07-15 NOTE — Assessment & Plan Note (Signed)
Chronic.  Last LDL in December 2021 was 133.  At that time, she reports she was not taking the atorvastatin regularly.  She is now taking it regularly and is fasting today, so we will recheck lipids today.  Her LDL goal should be less than 70 given her history of diet-controlled diabetes.  We will plan to increase atorvastatin to 40 mg daily if LDL is greater than 70.  This was discussed with the patient.  Follow-up 3 months.

## 2020-07-15 NOTE — Assessment & Plan Note (Signed)
Chronic.  Blood pressure elevated above goal of 140/90 today in clinic.  Patient does not check blood pressure consistently at home and has not taken her hydrochlorothiazide yet today.  Encourage patient to check blood pressure consistently and report readings of consistently greater than 140/90 to [place] so we can adjust medication.  We will continue on losartan 100 mg and hydrochlorothiazide 25 mg daily for now.  Kidney function with electrolytes checked today.  Follow-up in 3 months.

## 2020-07-15 NOTE — Assessment & Plan Note (Signed)
Chronic.  Patient's last A1c in December 2021 was 6.4%.  She is currently diet controlled without any medication we will recheck today given patient is not watching what she is eating and is eating quite a bit of sugar.  Goal for patient is less than 7%.  Given progressive numbness and tingling in fingers and toes, will start on gabapentin 100 mg every night and monitor for benefit.  Urine microalbumin up-to-date, needs eye exam and will reach out to ophthalmologist to schedule.  Foot exam is up-to-date.  Follow-up in 1 month to see how gabapentin is going.

## 2020-07-15 NOTE — Progress Notes (Signed)
  Subjective:    Patient ID: Nina Olson, female    DOB: 06/10/1945, 74 y.o.   MRN: [id number]  HPI: Nina Olson is a 74 y.o. female presenting for follow up.  Chief Complaint  Patient presents with  . Anal Itching    Began 1 month ago  . Follow-up    Per lab note, pt is fasting. Has not taken blood pressure med this morning   HYPERTENSION / HYPERLIPIDEMIA Currently taking losartan 100, HCTZ 25 and atorvastatin 20 mg daily.  Has not taken HCTZ today yet. Satisfied with current treatment? no Duration of hypertension: chronic BP monitoring frequency: not checking BP medication side effects: no Past BP meds: losartan 100, HCTZ 25 Duration of hyperlipidemia: chronic Cholesterol medication side effects: no Cholesterol supplements: none Past cholesterol medications: atorvastatin  Aspirin: no Recent stressors: no Recurrent headaches: no Visual changes: no Palpitations: no Dyspnea: no Chest pain: no Lower extremity edema: no Dizzy/lightheaded: no  PRE-DIABETES Last A1c in December 2021 was 6.4%.  Previously, has been as high as 6.8%.  She has never taken medicine for diabetes in the past.  Hypoglycemic episodes:no Polydipsia/polyuria: no Visual disturbance: no Chest pain: no Paresthesias: yes; fingers and toes  Glucose Monitoring: yes  Accucheck frequency: daily in the morning  Fasting glucose: low 100s Taking Insulin?: no Blood Pressure Monitoring: not checking Retinal Examination: Not up to Date; she is planning to call and schedule Foot Exam: Up to Date Diabetic Education: Not Completed Pneumovax: Up to Date Influenza: Up to Date Aspirin: no   GERD Currently taking Nexium daily.  Reports this controls her symptoms pretty well.  She sometimes does have to drink a cup of cold water to help with her symptoms as well. GERD control status: controlled Satisfied with current treatment? yes Heartburn frequency: About once per month Medication side effects: no   Medication compliance: Excellent Previous GERD medications: Nexium Antacid use frequency: Never Alleviatiating factors:   Cold water Aggravating factors: Certain foods Dysphagia: no Odynophagia:  no Hematemesis: no Blood in stool: no EGD: no  RECTAL ITCHING Does have a history of herpes - gets herpes on bottom and anus.  Thinks the itching may be a hemorrhoid or herpes flare Duration: 3 weeks Bright red rectal bleeding: no  Frequency: couple of times per week Anal fullness: no  Perianal pain: no  Bowel movement: dark brown, long sausage,  Perianal irritation/itching: yes  Constipation: no  Chronic straining/valsava:  no  Anal trauma/intercourse: no  Hemorrhoids: yes  Previous colonoscopy: yes  Treatments attempted: preparation H   VITAMIN D DEFICIENCY Reports she is taking vitamin D 5000 IU daily.  No recent falls.  She does have a history of osteopenia and has a DEXA scan scheduled for later this year.  No Known Allergies  Outpatient Encounter Medications as of 07/15/2020  Medication Sig  . Accu-Chek Softclix Lancets lancets USE TO CHECK CBG ONCE A DAY  . atorvastatin (LIPITOR) 20 MG tablet Take 1 tablet by mouth once daily  . blood glucose meter kit and supplies Dispense based on patient and insurance preference. Check CBG once a day (FOR ICD-10 E10.9, E11.9).  . Cholecalciferol (DIALYVITE VITAMIN D 5000 PO) Take by mouth.  . esomeprazole (NEXIUM) 40 MG capsule Take 1 capsule by mouth once daily  . gabapentin (NEURONTIN) 100 MG capsule Take 1 capsule (100 mg total) by mouth at bedtime.  . glucose blood (ACCU-CHEK GUIDE) test strip USE TO CHECK CBG ONCE A DAY  . hydrochlorothiazide (HYDRODIURIL)   25 MG tablet Take 1 tablet by mouth once daily  . losartan (COZAAR) 100 MG tablet Take 1 tablet by mouth once daily  . Misc Natural Products (NEURIVA PO) Take by mouth. Memory  . Multiple Vitamin (MULTI-VITAMIN) tablet Take by mouth.  . Probiotic Product (PROBIOTIC DAILY PO)  Take by mouth.  . vitamin C (ASCORBIC ACID) 500 MG tablet Take 1 tablet (500 mg total) by mouth daily.  . ZINC OXIDE PO Take by mouth.  . [DISCONTINUED] valACYclovir (VALTREX) 1000 MG tablet Take 1 tablet by mouth twice daily  . valACYclovir (VALTREX) 1000 MG tablet Take 1 tablet (1,000 mg total) by mouth daily. Take 1 pill daily for 5 days at the onset of a flare.   No facility-administered encounter medications on file as of 07/15/2020.    Patient Active Problem List   Diagnosis Date Noted  . Abdominal bloating 07/15/2020  . Abnormal feces 07/15/2020  . Colon cancer screening 07/15/2020  . Family history of malignant neoplasm of gastrointestinal tract 07/15/2020  . Generalized abdominal pain 07/15/2020  . Personal history of colonic polyps 07/15/2020  . Rectal bleeding 07/15/2020  . HSV (herpes simplex virus) anogenital infection 07/15/2020  . Rectal itching 07/15/2020  . Bladder prolapse, female, acquired 04/28/2019  . Controlled type 2 diabetes mellitus with neuropathy (HCC) 10/27/2018  . OA (osteoarthritis) of hip 05/24/2017  . DDD (degenerative disc disease), lumbar 05/24/2017  . GERD (gastroesophageal reflux disease) 02/10/2016  . Essential hypertension 07/26/2015  . Hyperlipidemia 07/26/2015  . Insomnia secondary to depression with anxiety 07/26/2015  . Osteopenia   . Palpitations   . Colon polyps     Past Medical History:  Diagnosis Date  . Colon polyps   . GERD (gastroesophageal reflux disease)   . Osteopenia   . Palpitations     Relevant past medical, surgical, family and social history reviewed and updated as indicated. Interim medical history since our last visit reviewed.  Review of Systems Per HPI unless specifically indicated above     Objective:    BP (!) 152/98   Pulse 61   Temp (!) 97 F (36.1 C)   Ht 5' 5" (1.651 m)   Wt 173 lb (78.5 kg)   SpO2 98%   BMI 28.79 kg/m   Wt Readings from Last 3 Encounters:  07/15/20 173 lb (78.5 kg)  03/16/20  167 lb (75.8 kg)  11/06/19 168 lb (76.2 kg)    Physical Exam Vitals and nursing note reviewed.  Constitutional:      General: She is not in acute distress.    Appearance: Normal appearance. She is not toxic-appearing.  HENT:     Head: Normocephalic and atraumatic.  Eyes:     General: No scleral icterus.    Extraocular Movements: Extraocular movements intact.  Neck:     Vascular: No carotid bruit.  Cardiovascular:     Rate and Rhythm: Normal rate and regular rhythm.     Heart sounds: Normal heart sounds. No murmur heard.   Pulmonary:     Effort: Pulmonary effort is normal. No respiratory distress.     Breath sounds: Normal breath sounds. No wheezing, rhonchi or rales.  Abdominal:     General: Abdomen is flat. Bowel sounds are normal. There is no distension.     Palpations: Abdomen is soft. There is no mass.     Tenderness: There is no right CVA tenderness or left CVA tenderness.  Genitourinary:    Rectum: Internal hemorrhoid present. No mass, anal fissure   or external hemorrhoid. Normal anal tone.  Musculoskeletal:        General: Normal range of motion.     Cervical back: Normal range of motion and neck supple.     Right lower leg: No edema.     Left lower leg: No edema.  Lymphadenopathy:     Cervical: No cervical adenopathy.  Skin:    General: Skin is warm and dry.     Capillary Refill: Capillary refill takes less than 2 seconds.     Coloration: Skin is not jaundiced or pale.     Findings: No erythema.  Neurological:     General: No focal deficit present.     Mental Status: She is alert and oriented to person, place, and time.  Psychiatric:        Mood and Affect: Mood normal.        Thought Content: Thought content normal.        Judgment: Judgment normal.        Assessment & Plan:   Problem List Items Addressed This Visit      Cardiovascular and Mediastinum   Essential hypertension    Chronic.  Blood pressure elevated above goal of 140/90 today in clinic.   Patient does not check blood pressure consistently at home and has not taken her hydrochlorothiazide yet today.  Encourage patient to check blood pressure consistently and report readings of consistently greater than 140/90 to [place] so we can adjust medication.  We will continue on losartan 100 mg and hydrochlorothiazide 25 mg daily for now.  Kidney function with electrolytes checked today.  Follow-up in 3 months.      Relevant Orders   Lipid panel   BASIC METABOLIC PANEL WITH GFR     Digestive   GERD (gastroesophageal reflux disease)    Chronic.  Stable on Nexium.  Will check magnesium level today and continue Nexium for now.  Follow-up in 6 months.      Relevant Orders   Magnesium     Endocrine   Controlled type 2 diabetes mellitus with neuropathy ([place]) - Primary    Chronic.  Patient's last A1c in December 2021 was 6.4%.  She is currently diet controlled without any medication we will recheck today given patient is not watching what she is eating and is eating quite a bit of sugar.  Goal for patient is less than 7%.  Given progressive numbness and tingling in fingers and toes, will start on gabapentin 100 mg every night and monitor for benefit.  Urine microalbumin up-to-date, needs eye exam and will reach out to ophthalmologist to schedule.  Foot exam is up-to-date.  Follow-up in 1 month to see how gabapentin is going.      Relevant Medications   gabapentin (NEURONTIN) 100 MG capsule     Musculoskeletal and Integument   Rectal itching    Acute.  Unclear etiology-likely HSV recurrence versus hemorrhoid.  Refill given for valacyclovir 1 g.  Reviewed dosing with her-she is to take 1 g daily for 5 days at the onset of a flare.  If hemorrhoids recur, okay to try hydrocortisone cream instead of hydrocortisone wipes.  Follow-up if not improving.      Osteopenia    Last bone density scan was in 2015 and showed improvement in osteopenia.  Discussed fall precautions.  Scheduled for recheck of bone  density in June 2022.  Continue vitamin D supplementation for now.  Will check vitamin D level today.  Relevant Orders   VITAMIN D 25 Hydroxy (Vit-D Deficiency, Fractures)     Other   Hyperlipidemia    Chronic.  Last LDL in December 2021 was 133.  At that time, she reports she was not taking the atorvastatin regularly.  She is now taking it regularly and is fasting today, so we will recheck lipids today.  Her LDL goal should be less than 70 given her history of diet-controlled diabetes.  We will plan to increase atorvastatin to 40 mg daily if LDL is greater than 70.  This was discussed with the patient.  Follow-up 3 months.      Relevant Orders   Lipid panel   BASIC METABOLIC PANEL WITH GFR   HSV (herpes simplex virus) anogenital infection    Recurrent however no acute flare currently.  Will refill valacyclovir 1 g today.  Patient to start at onset of flare 1 pill/day for 5 days.  Patient to return to clinic if this does not help.      Relevant Medications   valACYclovir (VALTREX) 1000 MG tablet       Follow up plan: Return in about 1 month (around 08/14/2020) for neuropathy.

## 2020-07-15 NOTE — Assessment & Plan Note (Signed)
Acute.  Unclear etiology-likely HSV recurrence versus hemorrhoid.  Refill given for valacyclovir 1 g.  Reviewed dosing with her-she is to take 1 g daily for 5 days at the onset of a flare.  If hemorrhoids recur, okay to try hydrocortisone cream instead of hydrocortisone wipes.  Follow-up if not improving.

## 2020-07-15 NOTE — Assessment & Plan Note (Signed)
Recurrent however no acute flare currently.  Will refill valacyclovir 1 g today.  Patient to start at onset of flare 1 pill/day for 5 days.  Patient to return to clinic if this does not help.

## 2020-07-15 NOTE — Assessment & Plan Note (Signed)
Chronic.  Stable on Nexium.  Will check magnesium level today and continue Nexium for now.  Follow-up in 6 months.

## 2020-07-15 NOTE — Patient Instructions (Signed)
F/u 1 month neuropathy

## 2020-07-16 LAB — BASIC METABOLIC PANEL WITH GFR
BUN: 13 mg/dL (ref 7–25)
CO2: 28 mmol/L (ref 20–32)
Calcium: 9.7 mg/dL (ref 8.6–10.4)
Chloride: 105 mmol/L (ref 98–110)
Creat: 0.93 mg/dL (ref 0.60–0.93)
GFR, Est African American: 71 mL/min/{1.73_m2} (ref >=60)
GFR, Est Non African American: 61 mL/min/{1.73_m2} (ref >=60)
Glucose, Bld: 94 mg/dL (ref 65–99)
Potassium: 3.7 mmol/L (ref 3.5–5.3)
Sodium: 145 mmol/L (ref 135–146)

## 2020-07-16 LAB — VITAMIN D 25 HYDROXY (VIT D DEFICIENCY, FRACTURES): Vit D, 25-Hydroxy: 45 ng/mL (ref 30–100)

## 2020-07-16 LAB — LIPID PANEL
Cholesterol: 171 mg/dL (ref <200)
HDL: 50 mg/dL (ref >=50)
LDL Cholesterol (Calc): 104 mg/dL (calc) — ABNORMAL HIGH
Non-HDL Cholesterol (Calc): 121 mg/dL (calc) (ref <130)
Total CHOL/HDL Ratio: 3.4 (calc) (ref <5.0)
Triglycerides: 81 mg/dL (ref <150)

## 2020-07-16 LAB — MAGNESIUM: Magnesium: 2.1 mg/dL (ref 1.5–2.5)

## 2020-08-03 ENCOUNTER — Other Ambulatory Visit: Payer: Self-pay | Admitting: Family Medicine

## 2020-08-10 DIAGNOSIS — E119 Type 2 diabetes mellitus without complications: Secondary | ICD-10-CM | POA: Diagnosis not present

## 2020-08-10 LAB — HM DIABETES EYE EXAM

## 2020-08-15 ENCOUNTER — Encounter: Payer: Self-pay | Admitting: Nurse Practitioner

## 2020-08-15 ENCOUNTER — Other Ambulatory Visit: Payer: Self-pay

## 2020-08-15 ENCOUNTER — Ambulatory Visit (INDEPENDENT_AMBULATORY_CARE_PROVIDER_SITE_OTHER): Payer: Medicare HMO | Admitting: Nurse Practitioner

## 2020-08-15 VITALS — BP 140/84 | HR 85 | Temp 97.2°F | Ht 65.0 in | Wt 178.2 lb

## 2020-08-15 DIAGNOSIS — E782 Mixed hyperlipidemia: Secondary | ICD-10-CM | POA: Diagnosis not present

## 2020-08-15 DIAGNOSIS — E1140 Type 2 diabetes mellitus with diabetic neuropathy, unspecified: Secondary | ICD-10-CM

## 2020-08-15 DIAGNOSIS — I10 Essential (primary) hypertension: Secondary | ICD-10-CM | POA: Diagnosis not present

## 2020-08-15 DIAGNOSIS — A609 Anogenital herpesviral infection, unspecified: Secondary | ICD-10-CM | POA: Diagnosis not present

## 2020-08-15 MED ORDER — ATORVASTATIN CALCIUM 40 MG PO TABS: 40.0000 mg | ORAL_TABLET | Freq: Every day | ORAL | 1 refills | Status: DC

## 2020-08-15 MED ORDER — VALACYCLOVIR HCL 1 G PO TABS: 1000.0000 mg | ORAL_TABLET | Freq: Two times a day (BID) | ORAL | 0 refills | Status: DC

## 2020-08-15 NOTE — Assessment & Plan Note (Signed)
Refill given - did not pick up last refill.  Follow up with more frequent recurrence or any new issues.

## 2020-08-15 NOTE — Assessment & Plan Note (Signed)
Last LDL 104 April 2022.  Will increase atorvastatin to 40 mg daily; goal LDL is less than 70 given history of diabetes.  Follow up 3 months.

## 2020-08-15 NOTE — Assessment & Plan Note (Signed)
Chronic.  BP slightly above goal today in clinic.  Patient has not yet taken BP medication; encouraged to check BP at home and notify us if consistently >140/90 so we can adjust medication.  Recent kidney function and electrolytes normal.  Follow up in 3 months.

## 2020-08-15 NOTE — Progress Notes (Signed)
Subjective:    Patient ID: Nina Olson, female    DOB: [birth year], 75 y.o.   MRN: [id number]  HPI: Nina Olson is a 75 y.o. female presenting for follow up.  Chief Complaint  Patient presents with  . Diabetes    Not taking gabapentin due to side effects. Agrees with the increase with Lipitor   NEUROPATHY Read side effects and was too nervous to start gabapentin.  Mostly has tingling in toes; not too bothersome.  Saw Podiatry in past and received injections for neuromas in both feet.  Worse on right, but present in both.  Planning to f/u with Podiatry after visit to [place] to see daughter. Neuropathy status: stable  Satisfied with current treatment?: yes Medication side effects: n/a Medication compliance:  not currently taking medication Location: bilateral toes Pain: no Severity: mild  Quality: tingling Frequency: constant Bilateral: yes Symmetric: yes Numbness: no Decreased sensation: yes Weakness: no Context: stable Treatments attempted: nothing tried  HYPERLIPIDEMIA Hyperlipidemia status: LDL 104 last check; goal less than 70 given history of diabetes Satisfied with current treatment?  yes Side effects:  no Medication compliance: excellent compliance Past cholesterol meds: atorvastatin 20 mg daily Aspirin:  no The 10-year ASCVD risk score Mikey Bussing DC Jr., et al., 2013) is: 29.6%   Values used to calculate the score:     Age: 54 years     Sex: Female     Is Non-Hispanic African American: Yes     Diabetic: Yes     Tobacco smoker: No     Systolic Blood Pressure: 629 mmHg     Is BP treated: Yes     HDL Cholesterol: 50 mg/dL     Total Cholesterol: 171 mg/dL Chest pain:  no Coronary artery disease:  no Family history CAD:  no Family history early CAD:  no  No Known Allergies  Outpatient Encounter Medications as of 08/15/2020  Medication Sig  . Accu-Chek Softclix Lancets lancets USE TO CHECK CBG ONCE A DAY  . atorvastatin (LIPITOR) 40 MG tablet Take 1 tablet  (40 mg total) by mouth daily.  . blood glucose meter kit and supplies Dispense based on patient and insurance preference. Check CBG once a day (FOR ICD-10 E10.9, E11.9).  Marland Kitchen Cholecalciferol (DIALYVITE VITAMIN D 5000 PO) Take by mouth.  . esomeprazole (NEXIUM) 40 MG capsule Take 1 capsule by mouth once daily  . glucose blood (ACCU-CHEK GUIDE) test strip USE TO CHECK CBG ONCE A DAY  . hydrochlorothiazide (HYDRODIURIL) 25 MG tablet Take 1 tablet by mouth once daily  . losartan (COZAAR) 100 MG tablet Take 1 tablet by mouth once daily  . Misc Natural Products (NEURIVA PO) Take by mouth. Memory  . Multiple Vitamin (MULTI-VITAMIN) tablet Take by mouth.  . Probiotic Product (PROBIOTIC DAILY PO) Take by mouth.  . vitamin C (ASCORBIC ACID) 500 MG tablet Take 1 tablet (500 mg total) by mouth daily.  Marland Kitchen ZINC OXIDE PO Take by mouth.  . [DISCONTINUED] atorvastatin (LIPITOR) 20 MG tablet Take 1 tablet by mouth once daily  . [DISCONTINUED] gabapentin (NEURONTIN) 100 MG capsule Take 1 capsule (100 mg total) by mouth at bedtime.  . [DISCONTINUED] valACYclovir (VALTREX) 1000 MG tablet Take 1 tablet (1,000 mg total) by mouth daily. Take 1 pill daily for 5 days at the onset of a flare.  . valACYclovir (VALTREX) 1000 MG tablet Take 1 tablet (1,000 mg total) by mouth 2 (two) times daily.   No facility-administered encounter medications on file as  of 08/15/2020.    Patient Active Problem List   Diagnosis Date Noted  . Abdominal bloating 07/15/2020  . Abnormal feces 07/15/2020  . Colon cancer screening 07/15/2020  . Family history of malignant neoplasm of gastrointestinal tract 07/15/2020  . Generalized abdominal pain 07/15/2020  . Personal history of colonic polyps 07/15/2020  . Rectal bleeding 07/15/2020  . HSV (herpes simplex virus) anogenital infection 07/15/2020  . Rectal itching 07/15/2020  . Bladder prolapse, female, acquired 04/28/2019  . Controlled type 2 diabetes mellitus with neuropathy ([place])  10/27/2018  . OA (osteoarthritis) of hip 05/24/2017  . DDD (degenerative disc disease), lumbar 05/24/2017  . GERD (gastroesophageal reflux disease) 02/10/2016  . Essential hypertension 07/26/2015  . Hyperlipidemia 07/26/2015  . Insomnia secondary to depression with anxiety 07/26/2015  . Osteopenia   . Palpitations   . Colon polyps     Past Medical History:  Diagnosis Date  . Colon polyps   . GERD (gastroesophageal reflux disease)   . Osteopenia   . Palpitations     Relevant past medical, surgical, family and social history reviewed and updated as indicated. Interim medical history since our last visit reviewed.  Review of Systems Per HPI unless specifically indicated above     Objective:    BP 140/84   Pulse 85   Temp (!) 97.2 F (36.2 C)   Ht $R'5\' 5"'Dy$  (1.651 m)   Wt 178 lb 3.2 oz (80.8 kg)   SpO2 96%   BMI 29.65 kg/m   Wt Readings from Last 3 Encounters:  08/15/20 178 lb 3.2 oz (80.8 kg)  07/15/20 173 lb (78.5 kg)  03/16/20 167 lb (75.8 kg)    Physical Exam Vitals and nursing note reviewed.  Constitutional:      General: She is not in acute distress.    Appearance: Normal appearance. She is not toxic-appearing.  HENT:     Head: Normocephalic and atraumatic.  Eyes:     General: No scleral icterus.    Extraocular Movements: Extraocular movements intact.  Cardiovascular:     Rate and Rhythm: Normal rate and regular rhythm.     Heart sounds: Normal heart sounds. No murmur heard.   Pulmonary:     Effort: Pulmonary effort is normal. No respiratory distress.     Breath sounds: Normal breath sounds. No wheezing, rhonchi or rales.  Abdominal:     General: Abdomen is flat.     Palpations: Abdomen is soft.  Skin:    General: Skin is warm.     Coloration: Skin is not jaundiced or pale.     Findings: No erythema.  Neurological:     Mental Status: She is alert and oriented to person, place, and time.     Motor: No weakness.     Gait: Gait normal.  Psychiatric:         Mood and Affect: Mood normal.        Behavior: Behavior normal.        Thought Content: Thought content normal.        Judgment: Judgment normal.     Results for orders placed or performed in visit on 07/15/20  Lipid panel  Result Value Ref Range   Cholesterol 171 <200 mg/dL   HDL 50 > OR = 50 mg/dL   Triglycerides 81 <150 mg/dL   LDL Cholesterol (Calc) 104 (H) mg/dL (calc)   Total CHOL/HDL Ratio 3.4 <5.0 (calc)   Non-HDL Cholesterol (Calc) 121 <130 mg/dL (calc)  BASIC METABOLIC PANEL WITH GFR  Result Value Ref Range   Glucose, Bld 94 65 - 99 mg/dL   BUN 13 7 - 25 mg/dL   Creat 0.93 0.60 - 0.93 mg/dL   GFR, Est Non African American 61 > OR = 60 mL/min/1.43m2   GFR, Est African American 71 > OR = 60 mL/min/1.12m2   BUN/Creatinine Ratio NOT APPLICABLE 6 - 22 (calc)   Sodium 145 135 - 146 mmol/L   Potassium 3.7 3.5 - 5.3 mmol/L   Chloride 105 98 - 110 mmol/L   CO2 28 20 - 32 mmol/L   Calcium 9.7 8.6 - 10.4 mg/dL  VITAMIN D 25 Hydroxy (Vit-D Deficiency, Fractures)  Result Value Ref Range   Vit D, 25-Hydroxy 45 30 - 100 ng/mL  Magnesium  Result Value Ref Range   Magnesium 2.1 1.5 - 2.5 mg/dL      Assessment & Plan:   Problem List Items Addressed This Visit      Cardiovascular and Mediastinum   Essential hypertension - Primary    Chronic.  BP slightly above goal today in clinic.  Patient has not yet taken BP medication; encouraged to check BP at home and notify us if consistently >140/90 so we can adjust medication.  Recent kidney function and electrolytes normal.  Follow up in 3 months.       Relevant Medications   atorvastatin (LIPITOR) 40 MG tablet     Endocrine   Controlled type 2 diabetes mellitus with neuropathy ([place])    Did not start gabapentin and is not interested in taking at this time due to side effects.  Neuropathy does not affect ability to function or exercise; does not impact quality of life.  Will continue to closely monitor for now.  Encouraged  daily foot checks and encouraged f/u with Podiatry to address pain r/t neuroma.  Follow up 3 months.      Relevant Medications   atorvastatin (LIPITOR) 40 MG tablet     Other   Hyperlipidemia    Last LDL 104 April 2022.  Will increase atorvastatin to 40 mg daily; goal LDL is less than 70 given history of diabetes.  Follow up 3 months.       Relevant Medications   atorvastatin (LIPITOR) 40 MG tablet   HSV (herpes simplex virus) anogenital infection    Refill given - did not pick up last refill.  Follow up with more frequent recurrence or any new issues.      Relevant Medications   valACYclovir (VALTREX) 1000 MG tablet       Follow up plan: Return for 3 months HTN/HLD/DM.

## 2020-08-15 NOTE — Assessment & Plan Note (Signed)
Did not start gabapentin and is not interested in taking at this time due to side effects.  Neuropathy does not affect ability to function or exercise; does not impact quality of life.  Will continue to closely monitor for now.  Encouraged daily foot checks and encouraged f/u with Podiatry to address pain r/t neuroma.  Follow up 3 months.

## 2020-08-16 ENCOUNTER — Ambulatory Visit: Payer: Medicare Other | Admitting: Internal Medicine

## 2020-08-31 ENCOUNTER — Other Ambulatory Visit: Payer: Self-pay | Admitting: Family Medicine

## 2020-09-07 ENCOUNTER — Ambulatory Visit: Payer: Medicare Other

## 2020-09-07 ENCOUNTER — Other Ambulatory Visit: Payer: Medicare Other

## 2020-09-13 ENCOUNTER — Other Ambulatory Visit: Payer: Self-pay

## 2020-09-13 ENCOUNTER — Ambulatory Visit
Admission: RE | Admit: 2020-09-13 | Discharge: 2020-09-13 | Disposition: A | Discharge status: Home / Self Care | Payer: Medicare Other | Source: Ambulatory Visit | Attending: Family Medicine | Admitting: Family Medicine

## 2020-09-13 DIAGNOSIS — Z1231 Encounter for screening mammogram for malignant neoplasm of breast: Secondary | ICD-10-CM | POA: Diagnosis not present

## 2020-09-16 ENCOUNTER — Other Ambulatory Visit: Payer: Self-pay | Admitting: Nurse Practitioner

## 2020-10-04 ENCOUNTER — Ambulatory Visit (INDEPENDENT_AMBULATORY_CARE_PROVIDER_SITE_OTHER): Payer: Medicare HMO | Admitting: Podiatry

## 2020-10-04 ENCOUNTER — Other Ambulatory Visit: Payer: Self-pay

## 2020-10-04 DIAGNOSIS — L600 Ingrowing nail: Secondary | ICD-10-CM

## 2020-10-04 DIAGNOSIS — G5763 Lesion of plantar nerve, bilateral lower limbs: Secondary | ICD-10-CM

## 2020-10-04 DIAGNOSIS — G588 Other specified mononeuropathies: Secondary | ICD-10-CM

## 2020-10-04 NOTE — Progress Notes (Signed)
  Subjective:  Patient ID: Nina Olson, female    DOB: April 19, 1945,  MRN: [id number]  Chief Complaint  Patient presents with   Neuroma    B/L feet. Numbness in balls of feet. B/L pains in great toes intermittently.    75 y.o. female presents with the above complaint. States the injection helped it calm down but it never got fully better.  Objective:  Physical Exam: warm, good capillary refill, no trophic changes or ulcerative lesions, normal DP and PT pulses and normal sensory exam. Left Foot: tenderness between the 3rd and 4th metatarsal head  Right Foot: tenderness between the 3rd and 4th metatarsal head, no residual ingrown nail right  Assessment:   1. Interdigital neuroma   2. Morton's neuroma of both feet   3. Ingrown nail     Plan:  Patient was evaluated and treated and all questions answered.  Interdigital Neuroma -At this point will order MRI to better evaluate the neuroma and possibly plan for removal. F/u after MRI. Patient would like to proceed with R foot first as it is more painful.  Return in about 3 weeks (around 10/25/2020) for MRI f/u.

## 2020-10-17 ENCOUNTER — Telehealth: Payer: Self-pay | Admitting: Nurse Practitioner

## 2020-10-17 NOTE — Telephone Encounter (Signed)
Patient requesting refill on her nexium. She uses Paediatric nurse at [facility].  CB# [phone]

## 2020-10-18 MED ORDER — ESOMEPRAZOLE MAGNESIUM 40 MG PO CPDR: 40.0000 mg | DELAYED_RELEASE_CAPSULE | Freq: Every day | ORAL | 0 refills | Status: DC

## 2020-10-18 NOTE — Telephone Encounter (Signed)
Med sent.

## 2020-10-25 ENCOUNTER — Ambulatory Visit: Payer: Medicare HMO | Admitting: Podiatry

## 2020-11-01 ENCOUNTER — Other Ambulatory Visit: Payer: Self-pay

## 2020-11-01 ENCOUNTER — Ambulatory Visit
Admission: RE | Admit: 2020-11-01 | Discharge: 2020-11-01 | Disposition: A | Discharge status: Home / Self Care | Payer: Medicare HMO | Source: Ambulatory Visit | Attending: Podiatry | Admitting: Podiatry

## 2020-11-01 DIAGNOSIS — M7989 Other specified soft tissue disorders: Secondary | ICD-10-CM | POA: Diagnosis not present

## 2020-11-01 DIAGNOSIS — R200 Anesthesia of skin: Secondary | ICD-10-CM | POA: Diagnosis not present

## 2020-11-01 DIAGNOSIS — G5763 Lesion of plantar nerve, bilateral lower limbs: Secondary | ICD-10-CM

## 2020-11-01 DIAGNOSIS — M79671 Pain in right foot: Secondary | ICD-10-CM | POA: Diagnosis not present

## 2020-11-01 MED ORDER — GADOBENATE DIMEGLUMINE 529 MG/ML IV SOLN
15.0000 mL | Freq: Once | INTRAVENOUS | Status: AC | PRN
  Administered 2020-11-01: 15 mL via INTRAVENOUS

## 2020-11-05 NOTE — Progress Notes (Signed)
Subjective:    Patient ID: Nina Olson, female    DOB: 07/09/1945, 75 y.o.   MRN: [id number]  HPI: Nina Olson is a 75 y.o. female presenting for skin issue.  Chief Complaint  Patient presents with   Skin Sensation Disorder    X3 months- itching to buttocks, more prominent on L side   SKIN LESION Duration: 3-4 months  Location: left side Painful: no Itching: yes; sometimes stings Onset: constant, waxes and wanes Context: stable Associated signs and symptoms: no drainage, fever, pain Treatment attempted: hemorrhoid wipes  DIZZINESS Duration: months, getting worse.  Has given up caffeine.   Description of symptoms: balance is off Duration of episode: seconds Dizziness frequency: daily Aggravating/provoking factors: changing positions Triggered by rolling over in bed: no Triggered by bending over: yes Aggravated by head movement: no Aggravated by exertion: no Aggravated by coughing: no Aggravated by loud noises: no Recent head injury: no Recent or current viral symptoms: no History of vasovagal episodes: no Falls: no Nausea: no Vomiting: no Tinnitus: no Hearing loss: no Aural fullness: no Headache: yes Photophobia: no Phonophobia: no Unsteady gait: yes Postural instability: no Diplopia: no Dysarthria: no Dysphagia: no Weakness: yes Related to exertion: no Pallor: no Diaphoresis: no Dyspnea: no Chest pain: no  Has been drinking more water for the past couple of days.  DIABETES Last A1c in December 2021 was 6.4%. Hypoglycemic episodes:no Polydipsia/polyuria: no Visual disturbance: no Chest pain: no Paresthesias: no Glucose Monitoring: yes  Accucheck frequency: occasionally in the morning  Fasting glucose: ~110  Taking Insulin?: no Blood Pressure Monitoring: daily Normal BP: 140/86 Retinal Examination: Up to Date Foot Exam: Not up to Date Diabetic Education: Completed Pneumovax: Up to Date Influenza: Not up to Date Aspirin: no  No  Known Allergies  Outpatient Encounter Medications as of 11/07/2020  Medication Sig   Accu-Chek Softclix Lancets lancets USE TO CHECK CBG ONCE A DAY   atorvastatin (LIPITOR) 40 MG tablet Take 1 tablet (40 mg total) by mouth daily.   blood glucose meter kit and supplies Dispense based on patient and insurance preference. Check CBG once a day (FOR ICD-10 E10.9, E11.9).   chlorhexidine (PERIDEX) 0.12 % solution 15 mL by Mouth route Two (2) times a day.   Cholecalciferol (DIALYVITE VITAMIN D 5000 PO) Take by mouth.   esomeprazole (NEXIUM) 40 MG capsule Take 1 capsule (40 mg total) by mouth daily.   glucose blood (ACCU-CHEK GUIDE) test strip USE TO CHECK CBG ONCE A DAY   hydrochlorothiazide (HYDRODIURIL) 25 MG tablet TAKE 1 TABLET (25 MG TOTAL) BY MOUTH DAILY.   ibuprofen (ADVIL) 800 MG tablet Take by mouth.   losartan (COZAAR) 100 MG tablet Take 1 tablet by mouth once daily   Misc Natural Products (NEURIVA PO) Take by mouth. Memory   Multiple Vitamin (MULTI-VITAMIN) tablet Take by mouth.   Probiotic Product (PROBIOTIC DAILY PO) Take by mouth.   valACYclovir (VALTREX) 1000 MG tablet Take 1 tablet (1,000 mg total) by mouth 2 (two) times daily.   vitamin C (ASCORBIC ACID) 500 MG tablet Take 1 tablet (500 mg total) by mouth daily.   ZINC OXIDE PO Take by mouth.   [EXPIRED] Zoster Vaccine Adjuvanted [facility]) injection Inject 0.5 mLs into the muscle once for 1 dose. Administer second dose 2-6 months after first dose.   [DISCONTINUED] amoxicillin (AMOXIL) 500 MG capsule Take by mouth. (Patient not taking: Reported on 11/07/2020)   No facility-administered encounter medications on file as of 11/07/2020.  Patient Active Problem List   Diagnosis Date Noted   Abdominal bloating 07/15/2020   Abnormal feces 07/15/2020   Colon cancer screening 07/15/2020   Family history of malignant neoplasm of gastrointestinal tract 07/15/2020   Generalized abdominal pain 07/15/2020   Personal history of colonic polyps  07/15/2020   Rectal bleeding 07/15/2020   HSV (herpes simplex virus) anogenital infection 07/15/2020   Rectal itching 07/15/2020   Bladder prolapse, female, acquired 04/28/2019   Controlled type 2 diabetes mellitus with neuropathy ([facility]) 10/27/2018   OA (osteoarthritis) of hip 05/24/2017   DDD (degenerative disc disease), lumbar 05/24/2017   GERD (gastroesophageal reflux disease) 02/10/2016   Essential hypertension 07/26/2015   Hyperlipidemia 07/26/2015   Insomnia secondary to depression with anxiety 07/26/2015   Osteopenia    Palpitations    Colon polyps     Past Medical History:  Diagnosis Date   Colon polyps    GERD (gastroesophageal reflux disease)    Osteopenia    Palpitations     Relevant past medical, surgical, family and social history reviewed and updated as indicated. Interim medical history since our last visit reviewed.  Review of Systems Per HPI unless specifically indicated above     Objective:    BP 132/66   Pulse 84   Temp 98 F (36.7 C) (Temporal)   Resp 16   Ht 5' 5" (1.651 m)   Wt 175 lb (79.4 kg)   SpO2 99%   BMI 29.12 kg/m   Wt Readings from Last 3 Encounters:  11/07/20 175 lb (79.4 kg)  08/15/20 178 lb 3.2 oz (80.8 kg)  07/15/20 173 lb (78.5 kg)    Physical Exam Vitals and nursing note reviewed.  Constitutional:      General: She is not in acute distress.    Appearance: Normal appearance. She is not toxic-appearing.  Eyes:     General: No scleral icterus.       Right eye: No discharge.        Left eye: No discharge.     Extraocular Movements: Extraocular movements intact.     Pupils: Pupils are equal, round, and reactive to light.  Cardiovascular:     Rate and Rhythm: Normal rate and regular rhythm.     Heart sounds: Normal heart sounds. No murmur heard. Pulmonary:     Effort: Pulmonary effort is normal. No respiratory distress.     Breath sounds: Normal breath sounds. No wheezing, rhonchi or rales.  Musculoskeletal:     Cervical  back: Normal range of motion and neck supple.  Lymphadenopathy:     Cervical: No cervical adenopathy.  Skin:    General: Skin is warm and dry.     Capillary Refill: Capillary refill takes less than 2 seconds.     Coloration: Skin is not jaundiced or pale.     Findings: Rash present. No erythema.          Comments: Approximately 4 cm x 1 cm area that is rough appearing, raised above skin, slightly darker than flesh colored.  No erythema, fluctuance, drainage, warmth.    Neurological:     General: No focal deficit present.     Mental Status: She is alert and oriented to person, place, and time.     Motor: No weakness or abnormal muscle tone.     Coordination: Romberg sign negative. Coordination normal. Finger-Nose-Finger Test and Heel to Shin Test normal.     Gait: Gait is intact. Gait normal.  Psychiatric:          Mood and Affect: Mood normal.        Behavior: Behavior normal.        Thought Content: Thought content normal.        Judgment: Judgment normal.    Results for orders placed or performed in visit on 11/07/20  Hemoglobin A1c  Result Value Ref Range   Hgb A1c MFr Bld 6.9 (H) <5.7 % of total Hgb   Mean Plasma Glucose 151 mg/dL   eAG (mmol/L) 8.4 mmol/L  COMPLETE METABOLIC PANEL WITH GFR  Result Value Ref Range   Glucose, Bld 85 65 - 99 mg/dL   BUN 12 7 - 25 mg/dL   Creat 1.03 (H) 0.60 - 1.00 mg/dL   eGFR 57 (L) > OR = 60 mL/min/1.73m2   BUN/Creatinine Ratio 12 6 - 22 (calc)   Sodium 141 135 - 146 mmol/L   Potassium 4.1 3.5 - 5.3 mmol/L   Chloride 103 98 - 110 mmol/L   CO2 27 20 - 32 mmol/L   Calcium 9.3 8.6 - 10.4 mg/dL   Total Protein 6.9 6.1 - 8.1 g/dL   Albumin 4.2 3.6 - 5.1 g/dL   Globulin 2.7 1.9 - 3.7 g/dL (calc)   AG Ratio 1.6 1.0 - 2.5 (calc)   Total Bilirubin 0.6 0.2 - 1.2 mg/dL   Alkaline phosphatase (APISO) 108 37 - 153 U/L   AST 15 10 - 35 U/L   ALT 16 6 - 29 U/L  CBC with Differential  Result Value Ref Range   WBC 4.9 3.8 - 10.8 Thousand/uL    RBC 4.43 3.80 - 5.10 Million/uL   Hemoglobin 12.5 11.7 - 15.5 g/dL   HCT 37.7 35.0 - 45.0 %   MCV 85.1 80.0 - 100.0 fL   MCH 28.2 27.0 - 33.0 pg   MCHC 33.2 32.0 - 36.0 g/dL   RDW 13.7 11.0 - 15.0 %   Platelets 276 140 - 400 Thousand/uL   MPV 9.6 7.5 - 12.5 fL   Neutro Abs 2,499 1,500 - 7,800 cells/uL   Lymphs Abs 1,495 850 - 3,900 cells/uL   Absolute Monocytes 617 200 - 950 cells/uL   Eosinophils Absolute 250 15 - 500 cells/uL   Basophils Absolute 39 0 - 200 cells/uL   Neutrophils Relative % 51 %   Total Lymphocyte 30.5 %   Monocytes Relative 12.6 %   Eosinophils Relative 5.1 %   Basophils Relative 0.8 %      Assessment & Plan:  1. Controlled type 2 diabetes mellitus with neuropathy ([facility]) Will recheck HgbA1c today along with kidney function, electrolytes, and blood counts.  She is up to date on eye exam.  Due for foot exam at next appointment.  She is on a high intensity statin.  She has been diet controlled for quite some time.   - Hemoglobin A1c - COMPLETE METABOLIC PANEL WITH GFR - CBC with Differential  2. Rash Chronic.  I suspect rash in between gluteal fold may be from friction.  Start using vaseline/aquaphor regularly and follow up if this does not help.    3. Dizziness Acute.  Suspect orthostatic hypotension related to possibly diuretic therapy.  Neurologic exam normal and no red flags in history or on exam today.  Encouraged checking BP at home if extremely symptomatic, plenty of hydration with water. Will check electrolytes and kidney function.  Discussed fall prevention including changing positions slowly.    4. Need for shingles vaccine  - Zoster Vaccine Adjuvanted (SHINGRIX) injection; Inject 0.5 mLs   into the muscle once for 1 dose. Administer second dose 2-6 months after first dose.  Dispense: 1 each; Refill: 0    Follow up plan: Return for pending lab work, December for wellness. 

## 2020-11-07 ENCOUNTER — Encounter: Payer: Self-pay | Admitting: Nurse Practitioner

## 2020-11-07 ENCOUNTER — Ambulatory Visit (INDEPENDENT_AMBULATORY_CARE_PROVIDER_SITE_OTHER): Payer: Medicare HMO | Admitting: Nurse Practitioner

## 2020-11-07 ENCOUNTER — Other Ambulatory Visit: Payer: Self-pay

## 2020-11-07 VITALS — BP 132/66 | HR 84 | Temp 98.0°F | Resp 16 | Ht 65.0 in | Wt 175.0 lb

## 2020-11-07 DIAGNOSIS — Z23 Encounter for immunization: Secondary | ICD-10-CM | POA: Diagnosis not present

## 2020-11-07 DIAGNOSIS — R21 Rash and other nonspecific skin eruption: Secondary | ICD-10-CM | POA: Diagnosis not present

## 2020-11-07 DIAGNOSIS — R42 Dizziness and giddiness: Secondary | ICD-10-CM | POA: Diagnosis not present

## 2020-11-07 DIAGNOSIS — E1140 Type 2 diabetes mellitus with diabetic neuropathy, unspecified: Secondary | ICD-10-CM | POA: Diagnosis not present

## 2020-11-07 MED ORDER — SHINGRIX 50 MCG/0.5ML IM SUSR: 0.5000 mL | Freq: Once | INTRAMUSCULAR | 0 refills | Status: AC

## 2020-11-08 ENCOUNTER — Ambulatory Visit: Payer: Medicare HMO | Admitting: Podiatry

## 2020-11-08 LAB — CBC WITH DIFFERENTIAL/PLATELET
Absolute Monocytes: 617 cells/uL (ref 200–950)
Basophils Absolute: 39 cells/uL (ref 0–200)
Basophils Relative: 0.8 %
Eosinophils Absolute: 250 cells/uL (ref 15–500)
Eosinophils Relative: 5.1 %
HCT: 37.7 % (ref 35.0–45.0)
Hemoglobin: 12.5 g/dL (ref 11.7–15.5)
Lymphs Abs: 1495 cells/uL (ref 850–3900)
MCH: 28.2 pg (ref 27.0–33.0)
MCHC: 33.2 g/dL (ref 32.0–36.0)
MCV: 85.1 fL (ref 80.0–100.0)
MPV: 9.6 fL (ref 7.5–12.5)
Monocytes Relative: 12.6 %
Neutro Abs: 2499 cells/uL (ref 1500–7800)
Neutrophils Relative %: 51 %
Platelets: 276 10*3/uL (ref 140–400)
RBC: 4.43 10*6/uL (ref 3.80–5.10)
RDW: 13.7 % (ref 11.0–15.0)
Total Lymphocyte: 30.5 %
WBC: 4.9 10*3/uL (ref 3.8–10.8)

## 2020-11-08 LAB — COMPLETE METABOLIC PANEL WITH GFR
AG Ratio: 1.6 (calc) (ref 1.0–2.5)
ALT: 16 U/L (ref 6–29)
AST: 15 U/L (ref 10–35)
Albumin: 4.2 g/dL (ref 3.6–5.1)
Alkaline phosphatase (APISO): 108 U/L (ref 37–153)
BUN/Creatinine Ratio: 12 (calc) (ref 6–22)
BUN: 12 mg/dL (ref 7–25)
CO2: 27 mmol/L (ref 20–32)
Calcium: 9.3 mg/dL (ref 8.6–10.4)
Chloride: 103 mmol/L (ref 98–110)
Creat: 1.03 mg/dL — ABNORMAL HIGH (ref 0.60–1.00)
Globulin: 2.7 g/dL (calc) (ref 1.9–3.7)
Glucose, Bld: 85 mg/dL (ref 65–99)
Potassium: 4.1 mmol/L (ref 3.5–5.3)
Sodium: 141 mmol/L (ref 135–146)
Total Bilirubin: 0.6 mg/dL (ref 0.2–1.2)
Total Protein: 6.9 g/dL (ref 6.1–8.1)
eGFR: 57 mL/min/{1.73_m2} — ABNORMAL LOW (ref >=60)

## 2020-11-08 LAB — HEMOGLOBIN A1C
Hgb A1c MFr Bld: 6.9 % of total Hgb — ABNORMAL HIGH (ref <5.7)
Mean Plasma Glucose: 151 mg/dL
eAG (mmol/L): 8.4 mmol/L

## 2020-11-11 ENCOUNTER — Telehealth: Payer: Self-pay | Admitting: *Deleted

## 2020-11-11 NOTE — Chronic Care Management (AMB) (Signed)
  Chronic Care Management   Note  11/11/2020 Name: [name] MRN: [id number] DOB: [birth year]  [name] is a 75 y.o. year old female who is a primary care patient of Eulogio Bear, NP. I reached out to Marko Stai by phone today in response to a referral sent by Ms. Catalina Lunger Kahler's PCP, Eulogio Bear, NP      Ms. Zertuche was given information about Chronic Care Management services today including:  CCM service includes personalized support from designated clinical staff supervised by her physician, including individualized plan of care and coordination with other care providers 24/7 contact phone numbers for assistance for urgent and routine care needs. Service will only be billed when office clinical staff spend 20 minutes or more in a month to coordinate care. Only one practitioner may furnish and bill the service in a calendar month. The patient may stop CCM services at any time (effective at the end of the month) by phone call to the office staff. The patient will be responsible for cost sharing (co-pay) of up to 20% of the service fee (after annual deductible is met).  Patient agreed to services and verbal consent obtained.   Follow up plan: Telephone appointment with care management team member scheduled for:11/28/20  Michell Giuliano  Care Guide, Embedded Care Coordination [facility]  Care Management  Direct Dial: [phone]

## 2020-11-13 ENCOUNTER — Other Ambulatory Visit: Payer: Self-pay | Admitting: Nurse Practitioner

## 2020-11-15 ENCOUNTER — Ambulatory Visit: Payer: Medicare HMO | Admitting: Podiatry

## 2020-11-16 ENCOUNTER — Ambulatory Visit (INDEPENDENT_AMBULATORY_CARE_PROVIDER_SITE_OTHER): Payer: Medicare HMO | Admitting: Nurse Practitioner

## 2020-11-16 ENCOUNTER — Other Ambulatory Visit: Payer: Self-pay

## 2020-11-16 ENCOUNTER — Ambulatory Visit: Payer: Medicare HMO | Admitting: Nurse Practitioner

## 2020-11-16 DIAGNOSIS — I10 Essential (primary) hypertension: Secondary | ICD-10-CM

## 2020-11-16 DIAGNOSIS — F32A Depression, unspecified: Secondary | ICD-10-CM

## 2020-11-16 DIAGNOSIS — F419 Anxiety disorder, unspecified: Secondary | ICD-10-CM

## 2020-11-17 NOTE — Progress Notes (Signed)
Patient presented for annual wellness visit, however not due until December.  Visit rescheduled.

## 2020-11-22 ENCOUNTER — Other Ambulatory Visit: Payer: Self-pay | Admitting: Nurse Practitioner

## 2020-11-22 ENCOUNTER — Ambulatory Visit: Payer: Medicare HMO | Admitting: Podiatry

## 2020-11-22 ENCOUNTER — Other Ambulatory Visit: Payer: Self-pay

## 2020-11-22 DIAGNOSIS — G5763 Lesion of plantar nerve, bilateral lower limbs: Secondary | ICD-10-CM

## 2020-11-22 DIAGNOSIS — G588 Other specified mononeuropathies: Secondary | ICD-10-CM | POA: Diagnosis not present

## 2020-11-22 DIAGNOSIS — A609 Anogenital herpesviral infection, unspecified: Secondary | ICD-10-CM

## 2020-11-22 NOTE — Progress Notes (Signed)
  Subjective:  Patient ID: Nina Olson, female    DOB: 1946/03/22,  MRN: [id number]  Chief Complaint  Patient presents with   Neuroma    Follow up neuroma.    75 y.o. female presents with the above complaint. States the feet actually are feeling better and she is not having the pain she was previously experiencing.  Objective:  Physical Exam: warm, good capillary refill, no trophic changes or ulcerative lesions, normal DP and PT pulses and normal sensory exam. Left Foot: tenderness between the 3rd and 4th metatarsal head  Right Foot: tenderness between the 3rd and 4th metatarsal head, no residual ingrown nail right  Study Result  Narrative & Impression  CLINICAL DATA:  Chronic right foot pain with swelling and burning sensation. Numbness in the toes. History of Morton's neuroma.   EXAM: MRI OF THE RIGHT FOREFOOT WITHOUT AND WITH CONTRAST   TECHNIQUE: Multiplanar, multisequence MR imaging of the right forefoot was performed before and after the administration of intravenous contrast.   CONTRAST:  15 ML MULTIHANCE GADOBENATE DIMEGLUMINE 529 MG/ML IV SOLN   COMPARISON:  Plain films right foot 10/30/2018.   FINDINGS: Bones/Joint/Cartilage   Marrow signal is normal without fracture, stress change or focal lesion. No joint effusion. Joint spaces are preserved.   Ligaments   Intact and normal in appearance.   Muscles and Tendons   Intact and normal in appearance.   Soft tissues   No mass or fluid collection is seen. Soft tissues are negative. In particular, no Morton's neuroma is identified. No pathologic enhancement after contrast administration.   IMPRESSION: Normal MRI right foot.   Assessment:   1. Interdigital neuroma   2. Morton's neuroma of both feet   3. Post-operative state    Plan:  Patient was evaluated and treated and all questions answered.  Interdigital Neuroma -Appears improved. -MRI reviewed with patient. No evidence on MRI for  neuroma. -Hold off surgical or other intervention given clinical improvement. F/u should issues persist.  No follow-ups on file.

## 2020-11-28 ENCOUNTER — Ambulatory Visit (INDEPENDENT_AMBULATORY_CARE_PROVIDER_SITE_OTHER): Payer: Medicare HMO | Admitting: *Deleted

## 2020-11-28 ENCOUNTER — Telehealth: Payer: Self-pay | Admitting: *Deleted

## 2020-11-28 DIAGNOSIS — E1159 Type 2 diabetes mellitus with other circulatory complications: Secondary | ICD-10-CM | POA: Diagnosis not present

## 2020-11-28 DIAGNOSIS — E119 Type 2 diabetes mellitus without complications: Secondary | ICD-10-CM

## 2020-11-28 DIAGNOSIS — I152 Hypertension secondary to endocrine disorders: Secondary | ICD-10-CM

## 2020-11-28 DIAGNOSIS — I10 Essential (primary) hypertension: Secondary | ICD-10-CM

## 2020-11-28 NOTE — Chronic Care Management (AMB) (Signed)
Chronic Care Management   CCM RN Visit Note  11/28/2020 Name: Nina Olson MRN: [id number] DOB: 1945-12-08  Subjective: Nina Olson is a 75 y.o. year old female who is a primary care patient of Eulogio Bear, NP. The care management team was consulted for assistance with disease management and care coordination needs.    Engaged with patient by telephone for initial visit in response to provider referral for case management and/or care coordination services.   Consent to Services:  The patient was given the following information about Chronic Care Management services today, agreed to services, and gave verbal consent: 1. CCM service includes personalized support from designated clinical staff supervised by the primary care provider, including individualized plan of care and coordination with other care providers 2. 24/7 contact phone numbers for assistance for urgent and routine care needs. 3. Service will only be billed when office clinical staff spend 20 minutes or more in a month to coordinate care. 4. Only one practitioner may furnish and bill the service in a calendar month. 5.The patient may stop CCM services at any time (effective at the end of the month) by phone call to the office staff. 6. The patient will be responsible for cost sharing (co-pay) of up to 20% of the service fee (after annual deductible is met). Patient agreed to services and consent obtained.  Patient agreed to services and verbal consent obtained.   Assessment: Review of patient past medical history, allergies, medications, health status, including review of consultants reports, laboratory and other test data, was performed as part of comprehensive evaluation and provision of chronic care management services.   SDOH (Social Determinants of Health) assessments and interventions performed:  SDOH Interventions    Flowsheet Row Most Recent Value  SDOH Interventions   Food Insecurity Interventions Intervention  Not Indicated  Transportation Interventions Intervention Not Indicated  Depression Interventions/Treatment  --  [Referral to LCSW for counseling,  pt has had counseling in the past, does not like the way some medications "make me feel"]        CCM Care Plan  No Known Allergies  Outpatient Encounter Medications as of 11/28/2020  Medication Sig   Accu-Chek Softclix Lancets lancets USE TO CHECK CBG ONCE A DAY   atorvastatin (LIPITOR) 40 MG tablet Take 1 tablet (40 mg total) by mouth daily.   blood glucose meter kit and supplies Dispense based on patient and insurance preference. Check CBG once a day (FOR ICD-10 E10.9, E11.9).   Cholecalciferol (DIALYVITE VITAMIN D 5000 PO) Take by mouth.   esomeprazole (NEXIUM) 40 MG capsule Take 1 capsule (40 mg total) by mouth daily.   glucose blood (ACCU-CHEK GUIDE) test strip USE TO CHECK CBG ONCE A DAY   hydrochlorothiazide (HYDRODIURIL) 25 MG tablet TAKE 1 TABLET EVERY DAY   ibuprofen (ADVIL) 800 MG tablet Take by mouth.   losartan (COZAAR) 100 MG tablet Take 1 tablet by mouth once daily   Multiple Vitamin (MULTI-VITAMIN) tablet Take by mouth.   Probiotic Product (PROBIOTIC DAILY PO) Take by mouth.   valACYclovir (VALTREX) 1000 MG tablet Take 1 tablet by mouth twice daily   vitamin C (ASCORBIC ACID) 500 MG tablet Take 1 tablet (500 mg total) by mouth daily.   ZINC OXIDE PO Take by mouth.   chlorhexidine (PERIDEX) 0.12 % solution 15 mL by Mouth route Two (2) times a day. (Patient not taking: Reported on 11/28/2020)   Misc Natural Products (NEURIVA PO) Take by mouth. Memory (Patient not taking:  Reported on 11/28/2020)   No facility-administered encounter medications on file as of 11/28/2020.    Patient Active Problem List   Diagnosis Date Noted   Abdominal bloating 07/15/2020   Abnormal feces 07/15/2020   Colon cancer screening 07/15/2020   Family history of malignant neoplasm of gastrointestinal tract 07/15/2020   Generalized abdominal pain  07/15/2020   Personal history of colonic polyps 07/15/2020   Rectal bleeding 07/15/2020   HSV (herpes simplex virus) anogenital infection 07/15/2020   Rectal itching 07/15/2020   Bladder prolapse, female, acquired 04/28/2019   Controlled type 2 diabetes mellitus with neuropathy ([place]) 10/27/2018   OA (osteoarthritis) of hip 05/24/2017   DDD (degenerative disc disease), lumbar 05/24/2017   GERD (gastroesophageal reflux disease) 02/10/2016   Essential hypertension 07/26/2015   Hyperlipidemia 07/26/2015   Insomnia secondary to depression with anxiety 07/26/2015   Osteopenia    Palpitations    Colon polyps     Conditions to be addressed/monitored:HTN and DMII  Care Plan : Diabetes Type 2 (Adult)  Updates made by Kassie Mends, RN since 11/28/2020 12:00 AM     Problem: Glycemic Management (Diabetes, Type 2)   Priority: Medium     Long-Range Goal: Glycemic Management Optimized   Start Date: 11/28/2020  Expected End Date: 05/31/2021  This Visit's Progress: On track  Priority: Medium  Note:   Objective:  Lab Results  Component Value Date   HGBA1C 6.9 (H) 11/07/2020   Lab Results  Component Value Date   CREATININE 1.03 (H) 11/07/2020   CREATININE 0.93 07/15/2020   CREATININE 0.91 03/16/2020   Lab Results  Component Value Date   EGFR 57 (L) 11/07/2020   Current Barriers:  Knowledge Deficits related to basic Diabetes pathophysiology and self care/management- pt needs reinforcement for ADA/ carbohydrate modified diet, pt does not always eat enough vegetables, drinks sugary drinks at times and reports she would like to do better, pt gets outside and walks her dog daily, used to go to [employer].  Pt reports she lives with spouse, is independent with all aspects of her care, has episodes of depression at times, has had counseling through her church in the past, did not like the way medication for depression made her feel.  Pt would like LCSW services for depression, pt requests  advanced directives packet be mailed to her.  Does not adhere to provider recommendations re:  carbohydrate modified diet. Case Manager Clinical Goal(s):  patient will demonstrate improved adherence to prescribed treatment plan for diabetes self care/management as evidenced by: adherence to ADA/ carb modified diet Interventions:  Collaboration with Eulogio Bear, NP regarding development and update of comprehensive plan of care as evidenced by provider attestation and co-signature Inter-disciplinary care team collaboration (see longitudinal plan of care) Provided education to patient about basic DM disease process Reviewed medications with patient and discussed importance of medication adherence Discussed plans with patient for ongoing care management follow up and provided patient with direct contact information for care management team Provided patient with written educational materials related to hypo and hyperglycemia and importance of correct treatment Reviewed scheduled/upcoming provider appointments including: primary care provider 9/12  Dexa Scan 9/29 Referral made to social work team for assistance with depression management- [id number] Review of patient status, including review of consultants reports, relevant laboratory and other test results, and medications completed. Advanced directives packet mailed to pt Reviewed carbohydrate modified diet Reviewed importance of exercise Self-Care Activities Attends all scheduled provider appointments Checks blood sugars as prescribed and utilize  hyper and hypoglycemia protocol as needed Adheres to prescribed ADA/carb modified Patient Goals: - check blood sugar at prescribed times - check blood sugar if I feel it is too high or too low - take the blood sugar log to all doctor visits - take the blood sugar meter to all doctor visits  - be mindful of carbohydrate intake- too much bread, pasta, rice, starchy vegetables will elevate blood  sugar - exercise some daily and get outside in the sunshine - see education sent via My Chart- hypoglycemia - expect a call from social worker for depression management - please look over and complete advanced directives packet - change to whole grain breads, cereal, pasta - drink 6 to 8 glasses of water each day - fill half of plate with vegetables - limit fast food meals to no more than 1 per week - manage portion size - check feet daily for cuts, sores or redness - wash and dry feet carefully every day - wear comfortable, cotton socks - wear comfortable, well-fitting shoes Follow Up Plan: Telephone follow up appointment with care management team member scheduled for: 01/09/2021    Care Plan : Hypertension (Adult)  Updates made by Kassie Mends, RN since 11/28/2020 12:00 AM     Problem: Hypertension (Hypertension)   Priority: Medium     Long-Range Goal: Hypertension Monitored   Start Date: 11/28/2020  Expected End Date: 05/31/2021  This Visit's Progress: On track  Priority: Medium  Note:   Objective:  Last practice recorded BP readings:  BP Readings from Last 3 Encounters:  11/07/20 132/66  08/15/20 140/84  07/15/20 (!) 152/98   Most recent eGFR/CrCl:  Lab Results  Component Value Date   EGFR 57 (L) 11/07/2020    No components found for: CRCL Current Barriers:  Knowledge Deficits related to basic understanding of hypertension pathophysiology and self care management- Patient reports she has blood pressure cuff but does not check blood pressure consistently, pt does try to get out and walk her dog but no other exercise, pt reports she takes all medications as prescribed. Does not adhere to provider recommendations re:  checks blood pressure occasionally Case Manager Clinical Goal(s):  patient will verbalize understanding of plan for hypertension management patient will attend all scheduled medical appointments patient will demonstrate improved adherence to prescribed  treatment plan for hypertension as evidenced by taking all medications as prescribed, monitoring and recording blood pressure as directed, adhering to low sodium/DASH diet patient will verbalize basic understanding of hypertension disease process and self health management plan as evidenced by well controlled blood pressure Interventions:  Collaboration with Eulogio Bear, NP regarding development and update of comprehensive plan of care as evidenced by provider attestation and co-signature Inter-disciplinary care team collaboration (see longitudinal plan of care) Evaluation of current treatment plan related to hypertension self management and patient's adherence to plan as established by provider. Provided education to patient re: stroke prevention, s/s of heart attack and stroke, DASH diet, complications of uncontrolled blood pressure Reviewed medications with patient and discussed importance of compliance Discussed plans with patient for ongoing care management follow up and provided patient with direct contact information for care management team Advised patient, providing education and rationale, to monitor blood pressure daily and record, calling PCP for findings outside established parameters.  Reviewed scheduled/upcoming provider appointments including: primary care provider 9/12  Dexa Scan 9/29 Reviewed importance of exercise and diet to control blood pressure Education sent via My Chart- low sodium diet Self-Care Activities: Attends  all scheduled provider appointments Calls provider office for new concerns, questions, or BP outside discussed parameters Checks BP and records as discussed Follows a low sodium diet/DASH diet Patient Goals: - check blood pressure 3 times per week - choose a place to take my blood pressure (home, clinic or office, retail store) - write blood pressure results in a log or diary  - follow low sodium diet- avoid salty snacks, choose fresh and frozen  whenever possible - please see education sent via My Chart- low sodium diet Follow Up Plan: Telephone follow up appointment with care management team member scheduled for:  01/09/2021     Plan:Telephone follow up appointment with care management team member scheduled for:  01/09/2021  Jacqlyn Larsen [facility], BSN RN Case Manager [facility] [phone]

## 2020-11-28 NOTE — Chronic Care Management (AMB) (Signed)
  Chronic Care Management   Note  11/28/2020 Name: TANYIA HYRE MRN: [id number] DOB: Oct 18, 1945  AIZA COTLER is a 75 y.o. year old female who is a primary care patient of Eulogio Bear, NP. VILDA SLONECKER is currently enrolled in care management services. An additional referral for Social Worker  was placed.   Follow up plan: Telephone appointment with care management team member scheduled for:12/15/20  [facility]  Direct Dial: [phone]

## 2020-11-28 NOTE — Patient Instructions (Signed)
Visit Information   PATIENT GOALS:   Goals Addressed             This Visit's Progress    Monitor and Manage My Blood Sugar-Diabetes Type 2       Timeframe:  Long-Range Goal Priority:  Medium Start Date:       11/28/2020                      Expected End Date:      05/31/2021                 Follow Up Date 01/09/2021   - check blood sugar at prescribed times - check blood sugar if I feel it is too high or too low - take the blood sugar log to all doctor visits - take the blood sugar meter to all doctor visits  - be mindful of carbohydrate intake- too much bread, pasta, rice, starchy vegetables will elevate blood sugar - exercise some daily and get outside in the sunshine - see education sent via My Chart- hypoglycemia- expect a call from social worker for depression management - please look over and complete advanced directives packet    Why is this important?   Checking your blood sugar at home helps to keep it from getting very high or very low.  Writing the results in a diary or log helps the doctor know how to care for you.  Your blood sugar log should have the time, date and the results.  Also, write down the amount of insulin or other medicine that you take.  Other information, like what you ate, exercise done and how you were feeling, will also be helpful.     Notes:      Track and Manage My Blood Pressure-Hypertension       Timeframe:  Long-Range Goal Priority:  Medium Start Date:      11/28/2020                       Expected End Date:     05/31/2021                  Follow Up Date 01/09/2021    - check blood pressure 3 times per week - choose a place to take my blood pressure (home, clinic or office, retail store) - write blood pressure results in a log or diary  - follow low sodium diet- avoid salty snacks, choose fresh and frozen whenever possible - please see education sent via My Chart- low sodium diet   Why is this important?   You won't feel high blood  pressure, but it can still hurt your blood vessels.  High blood pressure can cause heart or kidney problems. It can also cause a stroke.  Making lifestyle changes like losing a little weight or eating less salt will help.  Checking your blood pressure at home and at different times of the day can help to control blood pressure.  If the doctor prescribes medicine remember to take it the way the doctor ordered.  Call the office if you cannot afford the medicine or if there are questions about it.     Notes:         Consent to CCM Services: Nina Olson was given information about Chronic Care Management services including:  CCM service includes personalized support from designated clinical staff supervised by her physician, including individualized plan of care and coordination with  other care providers 24/7 contact phone numbers for assistance for urgent and routine care needs. Service will only be billed when office clinical staff spend 20 minutes or more in a month to coordinate care. Only one practitioner may furnish and bill the service in a calendar month. The patient may stop CCM services at any time (effective at the end of the month) by phone call to the office staff. The patient will be responsible for cost sharing (co-pay) of up to 20% of the service fee (after annual deductible is met).  Patient agreed to services and verbal consent obtained.   Patient verbalizes understanding of instructions provided today and agrees to view in [place].   Telephone follow up appointment with care management team member scheduled for:  10/3/2022Hypoglycemia Hypoglycemia is when the sugar (glucose) level in your blood is too low. Low blood sugar can happen to people who have diabetes and people who do not have diabetes. Low blood sugar can happenquickly, and it can be an emergency. What are the causes? This condition happens most often in people who have diabetes. It may be caused by: Diabetes  medicine. Not eating enough, or not eating often enough. Doing more physical activity. Drinking alcohol on an empty stomach. If you do not have diabetes, this condition may be caused by: A tumor in the pancreas. Not eating enough, or not eating for long periods at a time (fasting). A very bad infection or illness. Problems after having weight loss (bariatric) surgery. Kidney failure or liver failure. Certain medicines. What increases the risk? This condition is more likely to develop in people who: Have diabetes and take medicines to lower their blood sugar. Abuse alcohol. Have a very bad illness. What are the signs or symptoms? Mild Hunger. Sweating and feeling clammy. Feeling dizzy or light-headed. Being sleepy or having trouble sleeping. Feeling like you may vomit (nauseous). A fast heartbeat. A headache. Blurry vision. Mood changes, such as: Being grouchy. Feeling worried or nervous (anxious). Tingling or loss of feeling (numbness) around your mouth, lips, or tongue. Moderate Confusion and poor judgment. Behavior changes. Weakness. Uneven heartbeat. Trouble with moving (coordination). Very low Very low blood sugar (severe hypoglycemia) is a medical emergency. It can cause: Fainting. Seizures. Loss of consciousness (coma). Death. How is this treated? Treating low blood sugar Low blood sugar is often treated by eating or drinking something that has sugar in it right away. The food or drink should contain 15 grams of a fast-acting carb (carbohydrate). Options include: 4 oz (120 mL) of fruit juice. 4 oz (120 mL) of regular soda (not diet soda). A few pieces of hard candy. Check food labels to see how many pieces to eat for 15 grams. 1 Tbsp (15 mL) of sugar or honey. 4 glucose tablets. 1 tube of glucose gel. Treating low blood sugar if you have diabetes If you can think clearly and swallow safely, follow the 15:15 rule: Take 15 grams of a fast-acting carb. Talk  with your doctor about how much you should take. Always keep a source of fast-acting carb with you, such as: Glucose tablets (take 4 tablets). A few pieces of hard candy. Check food labels to see how many pieces to eat for 15 grams. 4 oz (120 mL) of fruit juice. 4 oz (120 mL) of regular soda (not diet soda). 1 Tbsp (15 mL) of honey or sugar. 1 tube of glucose gel. Check your blood sugar 15 minutes after you take the carb. If your blood sugar is  still at or below 70 mg/dL (3.9 mmol/L), take 15 grams of a carb again. If your blood sugar does not go above 70 mg/dL (3.9 mmol/L) after 3 tries, get help right away. After your blood sugar goes back to normal, eat a meal or a snack within 1 hour.  Treating very low blood sugar If your blood sugar is below 54 mg/dL (3 mmol/L), you have very low blood sugar, or severe hypoglycemia. This is an emergency. Get medical help right away. If you have very low blood sugar and you cannot eat or drink, you will need to be given a hormone called glucagon. A family member or friend should learn how to check your blood sugar and how to give you glucagon. Ask your doctor if youneed to have an emergency glucagon kit at home. Very low blood sugar may also need to be treated in a hospital. Follow these instructions at home: General instructions Take over-the-counter and prescription medicines only as told by your doctor. Stay aware of your blood sugar as told by your doctor. If you drink alcohol: Limit how much you have to: 0-1 drink a day for women who are not pregnant. 0-2 drinks a day for men. Know how much alcohol is in your drink. In the [place] one drink equals one 12 oz bottle of beer (355 mL), one 5 oz glass of wine (148 mL), or one 1 oz glass of hard liquor (44 mL). Be sure to eat food when you drink alcohol. Know that your body absorbs alcohol quickly. This may lead to low blood sugar later. Be sure to keep checking your blood sugar. Keep all follow-up  visits. If you have diabetes:  Always have a fast-acting carb (15 grams) with you to treat low blood sugar. Follow your diabetes care plan as told by your doctor. Make sure you: Know the symptoms of low blood sugar. Check your blood sugar as often as told. Always check it before and after exercise. Always check your blood sugar before you drive. Take your medicines as told. Follow your meal plan. Eat on time. Do not skip meals. Share your diabetes care plan with: Your work or school. People you live with. Carry a card or wear jewelry that says you have diabetes.  Where to find more information American Diabetes Association: www.diabetes.org Contact a doctor if: You have trouble keeping your blood sugar in your target range. You have low blood sugar often. Get help right away if: You still have symptoms after you eat or drink something that contains 15 grams of fast-acting carb, and you cannot get your blood sugar above 70 mg/dL by following the 15:15 rule. Your blood sugar is below 54 mg/dL (3 mmol/L). You have a seizure. You faint. These symptoms may be an emergency. Get help right away. Call your local emergency services (911 in the [place] Do not wait to see if the symptoms will go away. Do not drive yourself to the hospital. Summary Hypoglycemia happens when the level of sugar (glucose) in your blood is too low. Low blood sugar can happen to people who have diabetes and people who do not have diabetes. Low blood sugar can happen quickly, and it can be an emergency. Make sure you know the symptoms of low blood sugar and know how to treat it. Always keep a source of sugar (fast-acting carb) with you to treat low blood sugar. This information is not intended to replace advice given to you by your health care provider. Make  sure you discuss any questions you have with your healthcare provider. Document Revised: 02/25/2020 Document Reviewed: 02/25/2020 Elsevier Patient Education   2022 [place]. Low-Sodium Eating Plan Sodium, which is an element that makes up salt, helps you maintain a healthy balance of fluids in your body. Too much sodium can increase your bloodpressure and cause fluid and waste to be held in your body. Your health care provider or dietitian may recommend following this plan if you have high blood pressure (hypertension), kidney disease, liver disease, or heart failure. Eating less sodium can help lower your blood pressure, reduce swelling, and protect your heart, liver, andkidneys. What are tips for following this plan? Reading food labels The Nutrition Facts label lists the amount of sodium in one serving of the food. If you eat more than one serving, you must multiply the listed amount of sodium by the number of servings. Choose foods with less than 140 mg of sodium per serving. Avoid foods with 300 mg of sodium or more per serving. Shopping  Look for lower-sodium products, often labeled as "low-sodium" or "no salt added." Always check the sodium content, even if foods are labeled as "unsalted" or "no salt added." Buy fresh foods. Avoid canned foods and pre-made or frozen meals. Avoid canned, cured, or processed meats. Buy breads that have less than 80 mg of sodium per slice.  Cooking  Eat more home-cooked food and less restaurant, buffet, and fast food. Avoid adding salt when cooking. Use salt-free seasonings or herbs instead of table salt or sea salt. Check with your health care provider or pharmacist before using salt substitutes. Cook with plant-based oils, such as canola, sunflower, or olive oil.  Meal planning When eating at a restaurant, ask that your food be prepared with less salt or no salt, if possible. Avoid dishes labeled as brined, pickled, cured, smoked, or made with soy sauce, miso, or teriyaki sauce. Avoid foods that contain MSG (monosodium glutamate). MSG is sometimes added to [place] food, bouillon, and some canned  foods. Make meals that can be grilled, baked, poached, roasted, or steamed. These are generally made with less sodium. General information Most people on this plan should limit their sodium intake to 1,500-2,000 mg (milligrams) of sodium each day. What foods should I eat? Fruits Fresh, frozen, or canned fruit. Fruit juice. Vegetables Fresh or frozen vegetables. "No salt added" canned vegetables. "No salt added"tomato sauce and paste. Low-sodium or reduced-sodium tomato and vegetable juice. Grains Low-sodium cereals, including oats, puffed wheat and rice, and shredded wheat. Low-sodium crackers. Unsalted rice. Unsalted pasta. Low-sodium bread.Whole-grain breads and whole-grain pasta. Meats and other proteins Fresh or frozen (no salt added) meat, poultry, seafood, and fish. Low-sodium canned tuna and salmon. Unsalted nuts. Dried peas, beans, and lentils withoutadded salt. Unsalted canned beans. Eggs. Unsalted nut butters. Dairy Milk. Soy milk. Cheese that is naturally low in sodium, such as ricotta cheese, fresh mozzarella, or Swiss cheese. Low-sodium or reduced-sodium cheese. Creamcheese. Yogurt. Seasonings and condiments Fresh and dried herbs and spices. Salt-free seasonings. Low-sodium mustard and ketchup. Sodium-free salad dressing. Sodium-free light mayonnaise. Fresh orrefrigerated horseradish. Lemon juice. Vinegar. Other foods Homemade, reduced-sodium, or low-sodium soups. Unsalted popcorn and pretzels.Low-salt or salt-free chips. The items listed above may not be a complete list of foods and beverages you can eat. Contact a dietitian for more information. What foods should I avoid? Vegetables Sauerkraut, pickled vegetables, and relishes. Olives. [place] fries. Onion rings. Regular canned vegetables (not low-sodium or reduced-sodium). Regular canned tomato sauce and paste (  not low-sodium or reduced-sodium). Regular tomato and vegetable juice (not low-sodium or reduced-sodium).  Frozenvegetables in sauces. Grains Instant hot cereals. Bread stuffing, pancake, and biscuit mixes. Croutons. Seasoned rice or pasta mixes. Noodle soup cups. Boxed or frozen macaroni andcheese. Regular salted crackers. Self-rising flour. Meats and other proteins Meat or fish that is salted, canned, smoked, spiced, or pickled. Precooked or cured meat, such as sausages or meat loaves. Berniece Salines. Ham. Pepperoni. Hot dogs. Corned beef. Chipped beef. Salt pork. Jerky. Pickled herring. Anchovies andsardines. Regular canned tuna. Salted nuts. Dairy Processed cheese and cheese spreads. Hard cheeses. Cheese curds. Blue cheese.Feta cheese. String cheese. Regular cottage cheese. Buttermilk. Canned milk. Fats and oils Salted butter. Regular margarine. Ghee. Bacon fat. Seasonings and condiments Onion salt, garlic salt, seasoned salt, table salt, and sea salt. Canned and packaged gravies. Worcestershire sauce. Tartar sauce. Barbecue sauce. Teriyaki sauce. Soy sauce, including reduced-sodium. Steak sauce. Fish sauce. Oyster sauce. Cocktail sauce. Horseradish that you find on the shelf. Regular ketchup and mustard. Meat flavorings and tenderizers. Bouillon cubes. Hot sauce. Pre-made or packaged marinades. Pre-made or packaged taco seasonings. Relishes.Regular salad dressings. Salsa. Other foods Salted popcorn and pretzels. Corn chips and puffs. Potato and tortilla chips.Canned or dried soups. Pizza. Frozen entrees and pot pies. The items listed above may not be a complete list of foods and beverages you should avoid. Contact a dietitian for more information. Summary Eating less sodium can help lower your blood pressure, reduce swelling, and protect your heart, liver, and kidneys. Most people on this plan should limit their sodium intake to 1,500-2,000 mg (milligrams) of sodium each day. Canned, boxed, and frozen foods are high in sodium. Restaurant foods, fast foods, and pizza are also very high in sodium. You also get  sodium by adding salt to food. Try to cook at home, eat more fresh fruits and vegetables, and eat less fast food and canned, processed, or prepared foods. This information is not intended to replace advice given to you by your health care provider. Make sure you discuss any questions you have with your healthcare provider. Document Revised: 05/01/2019 Document Reviewed: 02/25/2019 Elsevier Patient Education  2022 [place]. Critical care medicine: Principles of diagnosis and management in the adult (4th ed., pp. 0932-6712). [place]."> Miller's anesthesia (8th ed., pp. 232-250). [place].">  Advance Directive  Advance directives are legal documents that allow you to make decisions about your health care and medical treatment in case you become unable to communicate for yourself. Advance directives let your wishes be known to family, friends,and health care providers. Discussing and writing advance directives should happen over time rather than all at once. Advance directives can be changed and updated at any time. There are different types of advance directives, such as: Medical power of attorney. Living will. Do not resuscitate (DNR) order or do not attempt resuscitation (DNAR) order. Health care proxy and medical power of attorney A health care proxy is also called a health care agent. This person is appointed to make medical decisions for you when you are unable to make decisions for yourself. Generally, people ask a trusted friend or family member to act as their proxy and represent their preferences. Make sure you have an agreement with your trusted person to act as your proxy. A proxy may have tomake a medical decision on your behalf if your wishes are not known. A medical power of attorney, also called a durable power of attorney for health care, is a legal document that names your health care  proxy. Depending on the laws in your state, the document may need to  be: Signed. Notarized. Dated. Copied. Witnessed. Incorporated into your medical record. You may also want to appoint a trusted person to manage your money in the event you are unable to do so. This is called a durable power of attorney for finances. It is a separate legal document from the durable power of attorney for health care. You may choose your health care proxy or someone different toact as your agent in money matters. If you do not appoint a proxy, or there is a concern that the proxy is not acting in your best interest, a court may appoint a guardian to act on yourbehalf. Living will A living will is a set of instructions that state your wishes about medical care when you cannot express them yourself. Health care providers should keep a copy of your living will in your medical record. You may want to give a copy to family members or friends. To alert caregivers in case of an emergency, you can place a card in your wallet to let them know that you have a living will and where they can find it. A living will is used if you become: Terminally ill. Disabled. Unable to communicate or make decisions. The following decisions should be included in your living will: To use or not to use life support equipment, such as dialysis machines and breathing machines (ventilators). Whether you want a DNR or DNAR order. This tells health care providers not to use cardiopulmonary resuscitation (CPR) if breathing or heartbeat stops. To use or not to use tube feeding. To be given or not to be given food and fluids. Whether you want comfort (palliative) care when the goal becomes comfort rather than a cure. Whether you want to donate your organs and tissues. A living will does not give instructions for distributing your money andproperty if you should pass away. DNR or DNAR A DNR or DNAR order is a request not to have CPR in the event that your heart stops beating or you stop breathing. If a DNR or DNAR  order has not been made and shared, a health care provider will try to help any patient whose heart has stopped or who has stopped breathing. If you plan to have surgery, talk with your health care provider about how your DNR or DNAR order will be followed ifproblems occur. What if I do not have an advance directive? Some states assign family decision makers to act on your behalf if you do not have an advance directive. Each state has its own laws about advance directives. You may want to check with your health care provider, attorney, orstate representative about the laws in your state. Summary Advance directives are legal documents that allow you to make decisions about your health care and medical treatment in case you become unable to communicate for yourself. The process of discussing and writing advance directives should happen over time. You can change and update advance directives at any time. Advance directives may include a medical power of attorney, a living will, and a DNR or DNAR order. This information is not intended to replace advice given to you by your health care provider. Make sure you discuss any questions you have with your healthcare provider. Document Revised: 12/29/2019 Document Reviewed: 12/29/2019 Elsevier Patient Education  2022 [place]   Jacqlyn Larsen [facility], BSN RN Case Manager [facility] [phone]   CLINICAL CARE PLAN: Patient Care  Plan: Diabetes Type 2 (Adult)     Problem Identified: Glycemic Management (Diabetes, Type 2)   Priority: Medium     Long-Range Goal: Glycemic Management Optimized   Start Date: 11/28/2020  Expected End Date: 05/31/2021  This Visit's Progress: On track  Priority: Medium  Note:   Objective:  Lab Results  Component Value Date   HGBA1C 6.9 (H) 11/07/2020   Lab Results  Component Value Date   CREATININE 1.03 (H) 11/07/2020   CREATININE 0.93 07/15/2020   CREATININE 0.91 03/16/2020   Lab Results   Component Value Date   EGFR 57 (L) 11/07/2020   Current Barriers:  Knowledge Deficits related to basic Diabetes pathophysiology and self care/management- pt needs reinforcement for ADA/ carbohydrate modified diet, pt does not always eat enough vegetables, drinks sugary drinks at times and reports she would like to do better, pt gets outside and walks her dog daily, used to go to [employer].  Pt reports she lives with spouse, is independent with all aspects of her care, has episodes of depression at times, has had counseling through her church in the past, did not like the way medication for depression made her feel.  Pt would like LCSW services for depression, pt requests advanced directives packet be mailed to her.  Does not adhere to provider recommendations re:  carbohydrate modified diet. Case Manager Clinical Goal(s):  patient will demonstrate improved adherence to prescribed treatment plan for diabetes self care/management as evidenced by: adherence to ADA/ carb modified diet Interventions:  Collaboration with Eulogio Bear, NP regarding development and update of comprehensive plan of care as evidenced by provider attestation and co-signature Inter-disciplinary care team collaboration (see longitudinal plan of care) Provided education to patient about basic DM disease process Reviewed medications with patient and discussed importance of medication adherence Discussed plans with patient for ongoing care management follow up and provided patient with direct contact information for care management team Provided patient with written educational materials related to hypo and hyperglycemia and importance of correct treatment Reviewed scheduled/upcoming provider appointments including: primary care provider 9/12  Dexa Scan 9/29 Referral made to social work team for assistance with depression management- [id number] Review of patient status, including review of consultants reports, relevant  laboratory and other test results, and medications completed. Advanced directives packet mailed to pt Reviewed carbohydrate modified diet Reviewed importance of exercise Self-Care Activities Attends all scheduled provider appointments Checks blood sugars as prescribed and utilize hyper and hypoglycemia protocol as needed Adheres to prescribed ADA/carb modified Patient Goals: - check blood sugar at prescribed times - check blood sugar if I feel it is too high or too low - take the blood sugar log to all doctor visits - take the blood sugar meter to all doctor visits  - be mindful of carbohydrate intake- too much bread, pasta, rice, starchy vegetables will elevate blood sugar - exercise some daily and get outside in the sunshine - see education sent via My Chart- hypoglycemia - expect a call from social worker for depression management - please look over and complete advanced directives packet - change to whole grain breads, cereal, pasta - drink 6 to 8 glasses of water each day - fill half of plate with vegetables - limit fast food meals to no more than 1 per week - manage portion size - check feet daily for cuts, sores or redness - wash and dry feet carefully every day - wear comfortable, cotton socks - wear comfortable, well-fitting shoes Follow Up Plan:  Telephone follow up appointment with care management team member scheduled for: 01/09/2021    Patient Care Plan: Hypertension (Adult)     Problem Identified: Hypertension (Hypertension)   Priority: Medium     Long-Range Goal: Hypertension Monitored   Start Date: 11/28/2020  Expected End Date: 05/31/2021  This Visit's Progress: On track  Priority: Medium  Note:   Objective:  Last practice recorded BP readings:  BP Readings from Last 3 Encounters:  11/07/20 132/66  08/15/20 140/84  07/15/20 (!) 152/98   Most recent eGFR/CrCl:  Lab Results  Component Value Date   EGFR 57 (L) 11/07/2020    No components found for:  CRCL Current Barriers:  Knowledge Deficits related to basic understanding of hypertension pathophysiology and self care management- Patient reports she has blood pressure cuff but does not check blood pressure consistently, pt does try to get out and walk her dog but no other exercise, pt reports she takes all medications as prescribed. Does not adhere to provider recommendations re:  checks blood pressure occasionally Case Manager Clinical Goal(s):  patient will verbalize understanding of plan for hypertension management patient will attend all scheduled medical appointments patient will demonstrate improved adherence to prescribed treatment plan for hypertension as evidenced by taking all medications as prescribed, monitoring and recording blood pressure as directed, adhering to low sodium/DASH diet patient will verbalize basic understanding of hypertension disease process and self health management plan as evidenced by well controlled blood pressure Interventions:  Collaboration with Eulogio Bear, NP regarding development and update of comprehensive plan of care as evidenced by provider attestation and co-signature Inter-disciplinary care team collaboration (see longitudinal plan of care) Evaluation of current treatment plan related to hypertension self management and patient's adherence to plan as established by provider. Provided education to patient re: stroke prevention, s/s of heart attack and stroke, DASH diet, complications of uncontrolled blood pressure Reviewed medications with patient and discussed importance of compliance Discussed plans with patient for ongoing care management follow up and provided patient with direct contact information for care management team Advised patient, providing education and rationale, to monitor blood pressure daily and record, calling PCP for findings outside established parameters.  Reviewed scheduled/upcoming provider appointments including:  primary care provider 9/12  Dexa Scan 9/29 Reviewed importance of exercise and diet to control blood pressure Education sent via My Chart- low sodium diet Self-Care Activities: Attends all scheduled provider appointments Calls provider office for new concerns, questions, or BP outside discussed parameters Checks BP and records as discussed Follows a low sodium diet/DASH diet Patient Goals: - check blood pressure 3 times per week - choose a place to take my blood pressure (home, clinic or office, retail store) - write blood pressure results in a log or diary  - follow low sodium diet- avoid salty snacks, choose fresh and frozen whenever possible - please see education sent via My Chart- low sodium diet Follow Up Plan: Telephone follow up appointment with care management team member scheduled for:  01/09/2021

## 2020-12-15 ENCOUNTER — Telehealth: Payer: Medicare HMO

## 2020-12-18 NOTE — Progress Notes (Signed)
Subjective:    Patient ID: Nina Olson, female    DOB: 01/25/1946, 75 y.o.   MRN: [id number]  HPI: Nina Olson is a 75 y.o. female presenting for follow up.  Chief Complaint  Patient presents with   Follow-up    Follow up discuss b/p medication , still having gi issues taking medication not helping , no vomiting ,nausea , having itching rectal     HYPERTENSION Dizziness is a little better, she is trying to drink more water.  Still taking losartan 100 mg daily and HCTZ 25 mg daily. Hypertension status: controlled  Satisfied with current treatment? yes Duration of hypertension: chronic BP monitoring frequency:  not checking BP medication side effects:  yes Medication compliance: excellent Aspirin: no Recurrent headaches: no Visual changes: no Palpitations: no Dyspnea: no Chest pain: no Lower extremity edema: no Dizzy/lightheaded: yes; improved.  Skin lesion  Itching/burning near rectum is better, however still there.  Took 7 day course of Valcyclovir and this helped.   GERD Currently taking esomeprazole 40 mg daily.  She is having some worsening of her symptoms, especially at night time and after she eats foods that trigger her. GERD control status: uncontrolled Satisfied with current treatment? yes Heartburn frequency: few times weekly Medication side effects: no  Medication compliance: good compliance Dysphagia: yes Odynophagia:  no Hematemesis: no Blood in stool: no EGD:  thinks she has had one in the past   No Known Allergies  Outpatient Encounter Medications as of 12/19/2020  Medication Sig   Accu-Chek Softclix Lancets lancets USE TO CHECK CBG ONCE A DAY   atorvastatin (LIPITOR) 40 MG tablet Take 1 tablet (40 mg total) by mouth daily.   blood glucose meter kit and supplies Dispense based on patient and insurance preference. Check CBG once a day (FOR ICD-10 E10.9, E11.9).   chlorhexidine (PERIDEX) 0.12 % solution    Cholecalciferol (DIALYVITE VITAMIN D  5000 PO) Take by mouth.   glucose blood (ACCU-CHEK GUIDE) test strip USE TO CHECK CBG ONCE A DAY   hydrochlorothiazide (HYDRODIURIL) 25 MG tablet TAKE 1 TABLET EVERY DAY   ibuprofen (ADVIL) 800 MG tablet Take by mouth.   losartan (COZAAR) 100 MG tablet Take 1 tablet by mouth once daily   Misc Natural Products (NEURIVA PO) Take by mouth. Memory   Multiple Vitamin (MULTI-VITAMIN) tablet Take by mouth.   Probiotic Product (PROBIOTIC DAILY PO) Take by mouth.   vitamin C (ASCORBIC ACID) 500 MG tablet Take 1 tablet (500 mg total) by mouth daily.   ZINC OXIDE PO Take by mouth.   [DISCONTINUED] esomeprazole (NEXIUM) 40 MG capsule Take 1 capsule (40 mg total) by mouth daily.   [DISCONTINUED] valACYclovir (VALTREX) 1000 MG tablet Take 1 tablet by mouth twice daily   esomeprazole (NEXIUM) 40 MG capsule Take 1 capsule (40 mg total) by mouth daily.   valACYclovir (VALTREX) 1000 MG tablet Take 0.5 tablets (500 mg total) by mouth daily.   No facility-administered encounter medications on file as of 12/19/2020.    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 Interim medical history since our last visit reviewed.  Review of Systems Per HPI unless specifically indicated above     Objective:    BP 118/68 (BP Location: Left Arm, Patient Position: Sitting, Cuff Size: Normal)   Pulse 98   Ht $R'5\' 5"'Eh$  (1.651 m)   Wt 171 lb 12.8 oz (77.9 kg)   SpO2 97%   BMI 28.59 kg/m   Wt Readings from Last 3 Encounters:  12/19/20 171 lb 12.8 oz (77.9 kg)  11/07/20 175 lb (79.4 kg)  08/15/20 178 lb 3.2 oz (80.8 kg)    Physical Exam Vitals and nursing note reviewed.  Constitutional:      General: She is not in acute distress.    Appearance: Normal appearance. She is not toxic-appearing.  HENT:     Head: Normocephalic and atraumatic.  Eyes:     General: No scleral icterus.    Extraocular Movements: Extraocular movements intact.  Cardiovascular:     Rate and Rhythm: Normal rate and regular rhythm.     Heart sounds: Normal heart sounds. No murmur heard. Pulmonary:     Effort: Pulmonary effort is normal. No respiratory distress.     Breath sounds: Normal breath sounds. No wheezing, rhonchi or rales.  Abdominal:     General: Abdomen is flat. Bowel sounds are normal. There is no distension.     Palpations: Abdomen is soft.     Tenderness: There is no abdominal tenderness. There is no guarding.  Skin:    General: Skin is warm.     Coloration: Skin is not jaundiced or pale.     Findings: No erythema.  Neurological:     Mental Status: She is alert and oriented to person, place, and time.     Motor: No weakness.     Gait: Gait normal.  Psychiatric:        Mood and Affect: Mood normal.        Behavior: Behavior normal.        Thought Content: Thought content normal.        Judgment: Judgment normal.      Assessment & Plan:   Problem List Items  Addressed This Visit       Cardiovascular and Mediastinum   Essential hypertension    Chronic.  BP at goal today in clinic.  Since her dizziness has improved, I do think there is a lack of hydration.  We will plan to cut HCTZ in half and patient will monitor BP at home.  Notify us if consistently >140/90.  Check kidney function and electrolytes today to see if kidney function has returned to baseline with increased hydration.  Follow up in 3 month.      Relevant Orders   BASIC METABOLIC PANEL WITH GFR     Digestive   GERD (gastroesophageal reflux disease)    Chronic, worsening slightly with some  regurgitation.  Plan to refill Nexium, try to avoid triggers, and sleep with HOB elevated.  If this does not work, consider referral to GI for possible upper endoscopy.      Relevant Medications   esomeprazole (NEXIUM) 40 MG capsule     Other   HSV (herpes simplex virus) anogenital infection - Primary    Start suppressive therapy with valcyclovir 500 mg daily.  Follow up in 3 months.      Relevant Medications   valACYclovir (VALTREX) 1000 MG tablet     Follow up plan: Return in about 3 months (around 03/20/2021).

## 2020-12-19 ENCOUNTER — Encounter: Payer: Self-pay | Admitting: Nurse Practitioner

## 2020-12-19 ENCOUNTER — Other Ambulatory Visit: Payer: Self-pay

## 2020-12-19 ENCOUNTER — Ambulatory Visit (INDEPENDENT_AMBULATORY_CARE_PROVIDER_SITE_OTHER): Payer: Medicare HMO | Admitting: Nurse Practitioner

## 2020-12-19 VITALS — BP 118/68 | HR 98 | Ht 65.0 in | Wt 171.8 lb

## 2020-12-19 DIAGNOSIS — K219 Gastro-esophageal reflux disease without esophagitis: Secondary | ICD-10-CM | POA: Diagnosis not present

## 2020-12-19 DIAGNOSIS — I10 Essential (primary) hypertension: Secondary | ICD-10-CM

## 2020-12-19 DIAGNOSIS — A609 Anogenital herpesviral infection, unspecified: Secondary | ICD-10-CM

## 2020-12-19 MED ORDER — ESOMEPRAZOLE MAGNESIUM 40 MG PO CPDR: 40.0000 mg | DELAYED_RELEASE_CAPSULE | Freq: Every day | ORAL | 1 refills | Status: DC

## 2020-12-19 MED ORDER — VALACYCLOVIR HCL 1 G PO TABS: 500.0000 mg | ORAL_TABLET | Freq: Every day | ORAL | 0 refills | Status: DC

## 2020-12-19 NOTE — Assessment & Plan Note (Addendum)
Chronic.  BP at goal today in clinic.  Since her dizziness has improved, I do think there is a lack of hydration.  We will plan to cut HCTZ in half and patient will monitor BP at home.  Notify us if consistently >140/90.  Check kidney function and electrolytes today to see if kidney function has returned to baseline with increased hydration.  Follow up in 3 month.

## 2020-12-19 NOTE — Assessment & Plan Note (Signed)
Chronic, worsening slightly with some regurgitation.  Plan to refill Nexium, try to avoid triggers, and sleep with HOB elevated.  If this does not work, consider referral to GI for possible upper endoscopy.

## 2020-12-19 NOTE — Assessment & Plan Note (Signed)
Start suppressive therapy with valcyclovir 500 mg daily.  Follow up in 3 months.

## 2020-12-20 LAB — BASIC METABOLIC PANEL WITH GFR
BUN: 15 mg/dL (ref 7–25)
CO2: 29 mmol/L (ref 20–32)
Calcium: 10.1 mg/dL (ref 8.6–10.4)
Chloride: 104 mmol/L (ref 98–110)
Creat: 0.96 mg/dL (ref 0.60–1.00)
Glucose, Bld: 97 mg/dL (ref 65–99)
Potassium: 3.7 mmol/L (ref 3.5–5.3)
Sodium: 143 mmol/L (ref 135–146)
eGFR: 62 mL/min/{1.73_m2} (ref >=60)

## 2020-12-21 ENCOUNTER — Ambulatory Visit (INDEPENDENT_AMBULATORY_CARE_PROVIDER_SITE_OTHER): Payer: Medicare HMO | Admitting: *Deleted

## 2020-12-21 DIAGNOSIS — F418 Other specified anxiety disorders: Secondary | ICD-10-CM

## 2020-12-21 DIAGNOSIS — F419 Anxiety disorder, unspecified: Secondary | ICD-10-CM

## 2020-12-21 DIAGNOSIS — F32A Depression, unspecified: Secondary | ICD-10-CM

## 2020-12-21 DIAGNOSIS — E1140 Type 2 diabetes mellitus with diabetic neuropathy, unspecified: Secondary | ICD-10-CM

## 2020-12-21 NOTE — Chronic Care Management (AMB) (Signed)
Chronic Care Management    Clinical Social Work Note  12/21/2020 Name: Nina Olson MRN: [id number] DOB: November 16, 1945  Nina Olson is a 75 y.o. year old female who is a primary care patient of Eulogio Bear, NP. The CCM team was consulted to assist the patient with chronic disease management and/or care coordination needs related to: Mental Health Counseling and Resources.   Engaged with patient by telephone for initial visit in response to provider referral for social work chronic care management and care coordination services.   Consent to Services:  The patient was given information about Chronic Care Management services, agreed to services, and gave verbal consent prior to initiation of services.  Please see initial visit note for detailed documentation.   Patient agreed to services and consent obtained.   Assessment: Review of patient past medical history, allergies, medications, and health status, including review of relevant consultants reports was performed today as part of a comprehensive evaluation and provision of chronic care management and care coordination services.     SDOH (Social Determinants of Health) assessments and interventions performed:    Advanced Directives Status: Not addressed in this encounter.  CCM Care Plan  No Known Allergies  Outpatient Encounter Medications as of 12/21/2020  Medication Sig   Accu-Chek Softclix Lancets lancets USE TO CHECK CBG ONCE A DAY   atorvastatin (LIPITOR) 40 MG tablet Take 1 tablet (40 mg total) by mouth daily.   blood glucose meter kit and supplies Dispense based on patient and insurance preference. Check CBG once a day (FOR ICD-10 E10.9, E11.9).   chlorhexidine (PERIDEX) 0.12 % solution    Cholecalciferol (DIALYVITE VITAMIN D 5000 PO) Take by mouth.   esomeprazole (NEXIUM) 40 MG capsule Take 1 capsule (40 mg total) by mouth daily.   glucose blood (ACCU-CHEK GUIDE) test strip USE TO CHECK CBG ONCE A DAY    hydrochlorothiazide (HYDRODIURIL) 25 MG tablet TAKE 1 TABLET EVERY DAY   ibuprofen (ADVIL) 800 MG tablet Take by mouth.   losartan (COZAAR) 100 MG tablet Take 1 tablet by mouth once daily   Misc Natural Products (NEURIVA PO) Take by mouth. Memory   Multiple Vitamin (MULTI-VITAMIN) tablet Take by mouth.   Probiotic Product (PROBIOTIC DAILY PO) Take by mouth.   valACYclovir (VALTREX) 1000 MG tablet Take 0.5 tablets (500 mg total) by mouth daily.   vitamin C (ASCORBIC ACID) 500 MG tablet Take 1 tablet (500 mg total) by mouth daily.   ZINC OXIDE PO Take by mouth.   No facility-administered encounter medications on file as of 12/21/2020.    Patient Active Problem List   Diagnosis Date Noted   Abdominal bloating 07/15/2020   Abnormal feces 07/15/2020   Colon cancer screening 07/15/2020   Family history of malignant neoplasm of gastrointestinal tract 07/15/2020   Generalized abdominal pain 07/15/2020   Personal history of colonic polyps 07/15/2020   Rectal bleeding 07/15/2020   HSV (herpes simplex virus) anogenital infection 07/15/2020   Rectal itching 07/15/2020   Bladder prolapse, female, acquired 04/28/2019   Controlled type 2 diabetes mellitus with neuropathy ([place]) 10/27/2018   OA (osteoarthritis) of hip 05/24/2017   DDD (degenerative disc disease), lumbar 05/24/2017   GERD (gastroesophageal reflux disease) 02/10/2016   Essential hypertension 07/26/2015   Hyperlipidemia 07/26/2015   Insomnia secondary to depression with anxiety 07/26/2015   Osteopenia    Palpitations    Colon polyps     Conditions to be addressed/monitored: Depression; Mental Health Concerns   Care Plan :  LCSW Plan of Care  Updates made by Deirdre Peer, LCSW since 12/21/2020 12:00 AM     Problem: Symptoms (Depression)      Long-Range Goal: Symptoms Monitored and Managed   Start Date: 12/21/2020  Expected End Date: 02/05/2021  This Visit's Progress: On track  Priority: High  Note:   Current Barriers:   Chronic Mental Health needs related to depression /sadness Mental Health Concerns  Suicidal Ideation/Homicidal Ideation: No  Clinical Social Work Goal(s):  patient will work with SW  PRN  by telephone or in person to reduce or manage symptoms related to depression demonstrate a reduction in symptoms related to :Depression, Grief, and Stress at family concerns  explore community resource options for unmet needs related [id number] counseling   Interventions: CSW spoke with pt about her depression and she is agreeable to counseling support. Pt also is encouraged to consider and discuss RX options for better treatment outcomes of her depression with PCP and Pharmacist. She has been hesitant in the past due to "the way it made me feel"  but willing to consider. Patient interviewed and appropriate assessments performed: brief mental health assessment Recent PHQ9 score of "9" 1:1 collaboration with Eulogio Bear, NP regarding development and update of comprehensive plan of care as evidenced by provider attestation and co-signature Patient interviewed and appropriate assessments performed Provided mental health counseling with regard to depression and allowing herself to not hold guilt/blame for other's issues Provided patient with information about depression, treatment options, etc Referred patient to [facility] for long term follow up and therapy/counseling Depression screen reviewed , Active listening / Reflection utilized , Emotional Supportive Provided, Provided brief CBT , Quality of sleep assessed & Sleep Hygiene techniques promoted , Participation in counseling encouraged , and Discussed referral to [facility] to assist with connecting to mental health provider  Patient Self Care Activities:  Self administers medications as prescribed Attends all scheduled provider appointments Attends church or other social activities Calls provider office for new concerns or questions Ability for  insight Motivation for treatment  Patient Coping Strengths:  Supportive [place] to Communicate Effectively  Patient Self Care Deficits:  Holding on to guilt/blaming self for others  Patient Goals:  - avoid negative self-talk - develop a personal safety plan - develop a plan to deal with triggers like holidays, anniversaries - have a plan for how to handle bad days - journal feelings and what helps to feel better or worse - spend time or talk with others at least 2 to 3 times per week - watch for early signs of feeling worse - begin personal counseling - call to cancel if needed - keep a calendar with appointment dates  Follow Up Plan: Appointment scheduled for SW follow up with client by phone on:  01/09/21      Follow Up Plan: Appointment scheduled for SW follow up with client by phone on: 01/09/21       Eduard Clos MSW, [place] Licensed Clinical Social Worker [facility] [phone]

## 2020-12-21 NOTE — Patient Instructions (Signed)
Visit Information  PATIENT GOALS:  Goals Addressed               This Visit's Progress     Begin and Stick with Counseling-Depression (pt-stated)        Timeframe:  Long-Range Goal Priority:  High Start Date:       12/21/20                      Expected End Date:    02/05/21                   Follow Up Date 01/09/21    -expect call from [facility] to arrange with scheduling counseling - keep 90 percent of counseling appointments - schedule on-going counseling appointments -consider RX medication for treating depression- discuss further with PCP and Pharmacist in office   Why is this important?   Beating depression may take some time.  If you don't feel better right away, don't give up on your treatment plan.    Notes:       Manage My Emotions          The patient verbalized understanding of instructions, educational materials, and care plan provided today and declined offer to receive copy of patient instructions, educational materials, and care plan.   Telephone follow up appointment with care management team member scheduled for:01/09/21  Eduard Clos MSW, [place] Licensed Clinical Social Worker [facility] [phone]

## 2021-01-05 ENCOUNTER — Other Ambulatory Visit: Payer: Self-pay

## 2021-01-05 ENCOUNTER — Ambulatory Visit
Admission: RE | Admit: 2021-01-05 | Discharge: 2021-01-05 | Disposition: A | Discharge status: Home / Self Care | Payer: Medicare HMO | Source: Ambulatory Visit | Attending: Family Medicine | Admitting: Family Medicine

## 2021-01-05 DIAGNOSIS — M8589 Other specified disorders of bone density and structure, multiple sites: Secondary | ICD-10-CM

## 2021-01-05 DIAGNOSIS — M85851 Other specified disorders of bone density and structure, right thigh: Secondary | ICD-10-CM | POA: Diagnosis not present

## 2021-01-05 DIAGNOSIS — Z780 Asymptomatic menopausal state: Secondary | ICD-10-CM | POA: Diagnosis not present

## 2021-01-06 DIAGNOSIS — F419 Anxiety disorder, unspecified: Secondary | ICD-10-CM

## 2021-01-06 DIAGNOSIS — E1140 Type 2 diabetes mellitus with diabetic neuropathy, unspecified: Secondary | ICD-10-CM | POA: Diagnosis not present

## 2021-01-06 DIAGNOSIS — F418 Other specified anxiety disorders: Secondary | ICD-10-CM

## 2021-01-06 DIAGNOSIS — F32A Depression, unspecified: Secondary | ICD-10-CM | POA: Diagnosis not present

## 2021-01-09 ENCOUNTER — Ambulatory Visit: Payer: Medicare HMO | Admitting: *Deleted

## 2021-01-09 ENCOUNTER — Other Ambulatory Visit: Payer: Self-pay | Admitting: Nurse Practitioner

## 2021-01-09 ENCOUNTER — Ambulatory Visit (INDEPENDENT_AMBULATORY_CARE_PROVIDER_SITE_OTHER): Payer: Medicare HMO | Admitting: *Deleted

## 2021-01-09 DIAGNOSIS — E119 Type 2 diabetes mellitus without complications: Secondary | ICD-10-CM

## 2021-01-09 DIAGNOSIS — F418 Other specified anxiety disorders: Secondary | ICD-10-CM

## 2021-01-09 DIAGNOSIS — I10 Essential (primary) hypertension: Secondary | ICD-10-CM

## 2021-01-09 DIAGNOSIS — A609 Anogenital herpesviral infection, unspecified: Secondary | ICD-10-CM

## 2021-01-09 NOTE — Chronic Care Management (AMB) (Signed)
Chronic Care Management   CCM RN Visit Note  01/09/2021 Name: Nina Olson MRN: [id number] DOB: 03-16-1946  Subjective: Nina Olson is a 75 y.o. year old female who is a primary care patient of Eulogio Bear, NP. The care management team was consulted for assistance with disease management and care coordination needs.    Engaged with patient by telephone for follow up visit in response to provider referral for case management and/or care coordination services.   Consent to Services:  The patient was given information about Chronic Care Management services, agreed to services, and gave verbal consent prior to initiation of services.  Please see initial visit note for detailed documentation.   Patient agreed to services and verbal consent obtained.   Assessment: Review of patient past medical history, allergies, medications, health status, including review of consultants reports, laboratory and other test data, was performed as part of comprehensive evaluation and provision of chronic care management services.   SDOH (Social Determinants of Health) assessments and interventions performed:    CCM Care Plan  No Known Allergies  Outpatient Encounter Medications as of 01/09/2021  Medication Sig   Accu-Chek Softclix Lancets lancets USE TO CHECK CBG ONCE A DAY   atorvastatin (LIPITOR) 40 MG tablet Take 1 tablet (40 mg total) by mouth daily.   blood glucose meter kit and supplies Dispense based on patient and insurance preference. Check CBG once a day (FOR ICD-10 E10.9, E11.9).   chlorhexidine (PERIDEX) 0.12 % solution    Cholecalciferol (DIALYVITE VITAMIN D 5000 PO) Take by mouth.   esomeprazole (NEXIUM) 40 MG capsule Take 1 capsule (40 mg total) by mouth daily.   glucose blood (ACCU-CHEK GUIDE) test strip USE TO CHECK CBG ONCE A DAY   hydrochlorothiazide (HYDRODIURIL) 25 MG tablet TAKE 1 TABLET EVERY DAY (Patient taking differently: Take 12.5 mg by mouth daily. Pt reports she takes  half of 25 mg tablet)   ibuprofen (ADVIL) 800 MG tablet Take by mouth.   losartan (COZAAR) 100 MG tablet Take 1 tablet by mouth once daily   Misc Natural Products (NEURIVA PO) Take by mouth. Memory   Multiple Vitamin (MULTI-VITAMIN) tablet Take by mouth.   Probiotic Product (PROBIOTIC DAILY PO) Take by mouth.   valACYclovir (VALTREX) 1000 MG tablet Take 0.5 tablets (500 mg total) by mouth daily.   vitamin C (ASCORBIC ACID) 500 MG tablet Take 1 tablet (500 mg total) by mouth daily.   ZINC OXIDE PO Take by mouth.   No facility-administered encounter medications on file as of 01/09/2021.    Patient Active Problem List   Diagnosis Date Noted   Abdominal bloating 07/15/2020   Abnormal feces 07/15/2020   Colon cancer screening 07/15/2020   Family history of malignant neoplasm of gastrointestinal tract 07/15/2020   Generalized abdominal pain 07/15/2020   Personal history of colonic polyps 07/15/2020   Rectal bleeding 07/15/2020   HSV (herpes simplex virus) anogenital infection 07/15/2020   Rectal itching 07/15/2020   Bladder prolapse, female, acquired 04/28/2019   Controlled type 2 diabetes mellitus with neuropathy ([place]) 10/27/2018   OA (osteoarthritis) of hip 05/24/2017   DDD (degenerative disc disease), lumbar 05/24/2017   GERD (gastroesophageal reflux disease) 02/10/2016   Essential hypertension 07/26/2015   Hyperlipidemia 07/26/2015   Insomnia secondary to depression with anxiety 07/26/2015   Osteopenia    Palpitations    Colon polyps     Conditions to be addressed/monitored:HTN and DMII  Care Plan : Diabetes Type 2 (Adult)  Updates made  by Kassie Mends, RN since 01/09/2021 12:00 AM     Problem: Glycemic Management (Diabetes, Type 2)   Priority: Medium     Long-Range Goal: Glycemic Management Optimized   Start Date: 11/28/2020  Expected End Date: 05/31/2021  This Visit's Progress: On track  Recent Progress: On track  Priority: Medium  Note:   Objective:  Lab Results   Component Value Date   HGBA1C 6.9 (H) 11/07/2020   Lab Results  Component Value Date   CREATININE 1.03 (H) 11/07/2020   CREATININE 0.93 07/15/2020   CREATININE 0.91 03/16/2020   Lab Results  Component Value Date   EGFR 57 (L) 11/07/2020   Current Barriers:  Knowledge Deficits related to basic Diabetes pathophysiology and self care/management- pt needs reinforcement for ADA/ carbohydrate modified diet, pt does not always eat enough vegetables, drinks sugary drinks at times and reports she would like to do better, pt gets outside and walks her dog daily, used to go to [employer].  Pt reports she lives with spouse, is independent with all aspects of her care, has episodes of depression at times, has had counseling through her church in the past, did not like the way medication for depression made her feel.  Pt is working with LCSW for depression management, pt reports she did receive advanced directives packet but has not completed. Does not adhere to provider recommendations re:  carbohydrate modified diet. Case Manager Clinical Goal(s):  patient will demonstrate improved adherence to prescribed treatment plan for diabetes self care/management as evidenced by: adherence to ADA/ carb modified diet Interventions:  Collaboration with Eulogio Bear, NP regarding development and update of comprehensive plan of care as evidenced by provider attestation and co-signature Inter-disciplinary care team collaboration (see longitudinal plan of care) Reviewed medications with patient and discussed importance of medication adherence Reinforced plans with patient for ongoing care management follow up and provided patient with direct contact information for care management team Reinforced education hypo and hyperglycemia and importance of correct treatment Reviewed scheduled/upcoming provider appointments including: primary care provider 03/20/2021 Encouraged patient to continue working with LCSW  for depression management Review of patient status, including review of consultants reports, relevant laboratory and other test results, and medications completed. Encouraged completion of advanced directives packet Reinforced carbohydrate modified diet and portion control Reinforced importance of exercise Self-Care Activities Attends all scheduled provider appointments Checks blood sugars as prescribed and utilize hyper and hypoglycemia protocol as needed Adheres to prescribed ADA/carb modified Patient Goals: - check blood sugar at prescribed times and record in log - take log and glucometer to doctor's visits - check blood sugar if I feel it is too high or too low - be mindful of carbohydrate intake- too much bread, pasta, rice, starchy vegetables will elevate blood sugar - practice portion control - exercise some daily and get outside in the sunshine - continue working with Education officer, museum for management of depression - please consider completing advanced directives packet - call RN care manager for any questions at [phone] - change to whole grain breads, cereal, pasta - drink 6 to 8 glasses of water each day - fill half of plate with vegetables - limit fast food meals to no more than 1 per week - manage portion size - check feet daily for cuts, sores or redness - wash and dry feet carefully every day - wear comfortable, cotton socks - wear comfortable, well-fitting shoes Follow Up Plan: Telephone follow up appointment with care management team member scheduled for:  03/06/2021    Care Plan : Hypertension (Adult)  Updates made by Kassie Mends, RN since 01/09/2021 12:00 AM     Problem: Hypertension (Hypertension)   Priority: Medium     Long-Range Goal: Hypertension Monitored   Start Date: 11/28/2020  Expected End Date: 05/31/2021  This Visit's Progress: On track  Recent Progress: On track  Priority: Medium  Note:   Objective:  Last practice recorded BP readings:   BP Readings from Last 3 Encounters:  11/07/20 132/66  08/15/20 140/84  07/15/20 (!) 152/98   Most recent eGFR/CrCl:  Lab Results  Component Value Date   EGFR 57 (L) 11/07/2020    No components found for: CRCL Current Barriers:  Knowledge Deficits related to basic understanding of hypertension pathophysiology and self care management- Patient reports she has blood pressure cuff but still not checkiing blood pressure consistently, pt does try to get out and walk her dog but no other exercise, pt reports she takes all medications as prescribed.  Pt recently had medication change per MD notes, take 1/2 tablet HCTZ, drink adequate fluids, monitor blood pressure and notify MD for readings above 140/90. Pt reports she recently had outbreak of genital herpes and used valacyclovir which did help, now has new outbreak as of last hs so will get valacyclovir refilled,  pt reports there is medication prescribed in the past (take daily) that would help keep this condition under better control but pt cannot afford 52$ / month, pt cannot remember name of medication Does not adhere to provider recommendations re:  checks blood pressure occasionally Case Manager Clinical Goal(s):  patient will verbalize understanding of plan for hypertension management patient will attend all scheduled medical appointments patient will demonstrate improved adherence to prescribed treatment plan for hypertension as evidenced by taking all medications as prescribed, monitoring and recording blood pressure as directed, adhering to low sodium/DASH diet patient will verbalize basic understanding of hypertension disease process and self health management plan as evidenced by well controlled blood pressure Interventions:  Collaboration with Eulogio Bear, NP regarding development and update of comprehensive plan of care as evidenced by provider attestation and co-signature Inter-disciplinary care team collaboration (see  longitudinal plan of care) Evaluation of current treatment plan related to hypertension self management and patient's adherence to plan as established by provider. Reinforced education to patient re: stroke prevention, s/s of heart attack and stroke, DASH diet, complications of uncontrolled blood pressure Reviewed medications with patient and discussed importance of compliance Reviewed plans with patient for ongoing care management follow up and provided patient with direct contact information for care management team Reinforced with patient, providing education and rationale, to monitor blood pressure daily and record, calling PCP for findings outside established parameters.  Reviewed scheduled/upcoming provider appointments including: primary care provider 03/20/2021 Reinforced importance of exercise and diet to control blood pressure Reinforced primary care provider instructions with pt-  take 1/2 tablet HCTZ, drink adequate fluids, monitor blood pressure and notify MD for readings above 140/90, pt verbalizes understanding In basket sent to primary care provider regarding name of medication prescribed in the past for genital herpes and pt interested in trying to take if pharmacist can provide resources as pt cannot afford 52$/ month. Self-Care Activities: Attends all scheduled provider appointments Calls provider office for new concerns, questions, or BP outside discussed parameters Checks BP and records as discussed Follows a low sodium diet/DASH diet Patient Goals: - check blood pressure 3 times per week and record in log - take log  to all doctor's appointments - take fluid pill as directed by your doctor - drink enough fluids - notify your doctor for blood pressure readings 140/90 and above - follow low sodium diet- avoid salty snacks, choose fresh and frozen whenever possible Follow Up Plan: Telephone follow up appointment with care management team member scheduled for:  03/06/2021      Plan:Telephone follow up appointment with care management team member scheduled for:  03/06/2021  Jacqlyn Larsen [facility], BSN RN Case Manager [facility] [phone]

## 2021-01-09 NOTE — Chronic Care Management (AMB) (Signed)
Chronic Care Management    Clinical Social Work Note  01/09/2021 Name: Nina Olson MRN: [id number] DOB: 1945/10/16  Nina Olson is a 75 y.o. year old female who is a primary care patient of Eulogio Bear, NP. The CCM team was consulted to assist the patient with chronic disease management and/or care coordination needs related to: Mental Health Counseling and Resources.   Engaged with patient by telephone for follow up visit in response to provider referral for social work chronic care management and care coordination services.   Consent to Services:  The patient was given information about Chronic Care Management services, agreed to services, and gave verbal consent prior to initiation of services.  Please see initial visit note for detailed documentation.   Patient agreed to services and consent obtained.   Assessment: Review of patient past medical history, allergies, medications, and health status, including review of relevant consultants reports was performed today as part of a comprehensive evaluation and provision of chronic care management and care coordination services.     SDOH (Social Determinants of Health) assessments and interventions performed:  SDOH Interventions    Flowsheet Row Most Recent Value  SDOH Interventions   Depression Interventions/Treatment  Counseling        Advanced Directives Status: Not addressed in this encounter.  CCM Care Plan  No Known Allergies  Outpatient Encounter Medications as of 01/09/2021  Medication Sig   Accu-Chek Softclix Lancets lancets USE TO CHECK CBG ONCE A DAY   atorvastatin (LIPITOR) 40 MG tablet Take 1 tablet (40 mg total) by mouth daily.   blood glucose meter kit and supplies Dispense based on patient and insurance preference. Check CBG once a day (FOR ICD-10 E10.9, E11.9).   chlorhexidine (PERIDEX) 0.12 % solution    Cholecalciferol (DIALYVITE VITAMIN D 5000 PO) Take by mouth.   esomeprazole (NEXIUM) 40 MG  capsule Take 1 capsule (40 mg total) by mouth daily.   glucose blood (ACCU-CHEK GUIDE) test strip USE TO CHECK CBG ONCE A DAY   hydrochlorothiazide (HYDRODIURIL) 25 MG tablet TAKE 1 TABLET EVERY DAY (Patient taking differently: Take 12.5 mg by mouth daily. Pt reports she takes half of 25 mg tablet)   ibuprofen (ADVIL) 800 MG tablet Take by mouth.   losartan (COZAAR) 100 MG tablet Take 1 tablet by mouth once daily   Misc Natural Products (NEURIVA PO) Take by mouth. Memory   Multiple Vitamin (MULTI-VITAMIN) tablet Take by mouth.   Probiotic Product (PROBIOTIC DAILY PO) Take by mouth.   valACYclovir (VALTREX) 1000 MG tablet Take 0.5 tablets (500 mg total) by mouth daily.   vitamin C (ASCORBIC ACID) 500 MG tablet Take 1 tablet (500 mg total) by mouth daily.   ZINC OXIDE PO Take by mouth.   No facility-administered encounter medications on file as of 01/09/2021.    Patient Active Problem List   Diagnosis Date Noted   Abdominal bloating 07/15/2020   Abnormal feces 07/15/2020   Colon cancer screening 07/15/2020   Family history of malignant neoplasm of gastrointestinal tract 07/15/2020   Generalized abdominal pain 07/15/2020   Personal history of colonic polyps 07/15/2020   Rectal bleeding 07/15/2020   HSV (herpes simplex virus) anogenital infection 07/15/2020   Rectal itching 07/15/2020   Bladder prolapse, female, acquired 04/28/2019   Controlled type 2 diabetes mellitus with neuropathy ([place]) 10/27/2018   OA (osteoarthritis) of hip 05/24/2017   DDD (degenerative disc disease), lumbar 05/24/2017   GERD (gastroesophageal reflux disease) 02/10/2016   Essential hypertension  07/26/2015   Hyperlipidemia 07/26/2015   Insomnia secondary to depression with anxiety 07/26/2015   Osteopenia    Palpitations    Colon polyps     Conditions to be addressed/monitored: Depression; Mental Health Concerns   Care Plan : LCSW Plan of Care  Updates made by Deirdre Peer, LCSW since 01/09/2021 12:00 AM      Problem: Symptoms (Depression)      Long-Range Goal: Symptoms Monitored and Managed   Start Date: 12/21/2020  Expected End Date: 03/08/2021  This Visit's Progress: On track  Recent Progress: On track  Priority: High  Note:   Current Barriers:  Chronic Mental Health needs related to depression /sadness Mental Health Concerns  Suicidal Ideation/Homicidal Ideation: No  Clinical Social Work Goal(s):  patient will work with SW  PRN  by telephone or in person to reduce or manage symptoms related to depression demonstrate a reduction in symptoms related to :Depression, Grief, and Stress at family concerns  explore community resource options for unmet needs related HX:TAVWPVX counseling   Interventions: CSW spoke with pt who has her first virtual counseling appointment 01/16/21. She is excited and hopeful.  Pt also completed the Depression screening with CSW; scoring in the moderate level.  Pt shared that prior to [place] she was more active; attending "fit for life" for exercise but they closed down. CSW encouraged pt to seek other options that [facility] covers through the "silver sneakers" program to get back into exercising, activity and socialization.  Also reminded pt to use the [facility] OTC catalog benefit before the end of this quarter/year.   Patient interviewed and appropriate assessments performed: brief mental health assessment Recent PHQ9 score of "9" 1:1 collaboration with Eulogio Bear, NP regarding development and update of comprehensive plan of care as evidenced by provider attestation and co-signature Patient interviewed and appropriate assessments performed Provided mental health counseling with regard to depression and allowing herself to not hold guilt/blame for other's issues Provided patient with information about depression, treatment options, etc Referred patient to [place] [facility] for long term follow up and therapy/counseling Depression screen reviewed , Active listening  / Reflection utilized , Emotional Supportive Provided, Provided brief CBT , Quality of sleep assessed & Sleep Hygiene techniques promoted , Participation in counseling encouraged , and Discussed referral to [place] to assist with connecting to mental health provider  Patient Self Care Activities:  Self administers medications as prescribed Attends all scheduled provider appointments Attends church or other social activities Calls provider office for new concerns or questions Ability for insight Motivation for treatment  Patient Coping Strengths:  Supportive [place] to Communicate Effectively  Patient Self Care Deficits:  Holding on to guilt/blaming self for others  Patient Goals:  -attend all counseling appointments -consider attending exercise programs again - call to cancel if needed - keep a calendar with appointment dates  Follow Up Plan: Appointment scheduled for SW follow up with client by phone on:  02/13/21      Follow Up Plan: Appointment scheduled for SW follow up with client by phone on: 02/13/21      Eduard Clos MSW, [place] Licensed Clinical Social Worker [facility] [phone]

## 2021-01-09 NOTE — Patient Instructions (Signed)
Visit Information  PATIENT GOALS:  Goals Addressed   None     The patient verbalized understanding of instructions, educational materials, and care plan provided today and declined offer to receive copy of patient instructions, educational materials, and care plan.   Telephone follow up appointment with care management team member scheduled for:11 7/22

## 2021-01-09 NOTE — Patient Instructions (Signed)
Visit Information  PATIENT GOALS:  Goals Addressed             This Visit's Progress    Monitor and Manage My Blood Sugar-Diabetes Type 2       Timeframe:  Long-Range Goal Priority:  Medium Start Date:       11/28/2020                      Expected End Date:      05/31/2021                 Follow Up Date 03/06/2021   - check blood sugar at prescribed times and record in log - take log and glucometer to doctor's visits - check blood sugar if I feel it is too high or too low - be mindful of carbohydrate intake- too much bread, pasta, rice, starchy vegetables will elevate blood sugar - practice portion control - exercise some daily and get outside in the sunshine - continue working with Education officer, museum for management of depression - please consider completing advanced directives packet - call RN care manager for any questions at [phone]    Why is this important?   Checking your blood sugar at home helps to keep it from getting very high or very low.  Writing the results in a diary or log helps the doctor know how to care for you.  Your blood sugar log should have the time, date and the results.  Also, write down the amount of insulin or other medicine that you take.  Other information, like what you ate, exercise done and how you were feeling, will also be helpful.     Notes:      Track and Manage My Blood Pressure-Hypertension       Timeframe:  Long-Range Goal Priority:  Medium Start Date:      11/28/2020                       Expected End Date:     05/31/2021                  Follow Up Date 03/06/2021   - check blood pressure 3 times per week and record in log - take log to all doctor's appointments - take fluid pill as directed by your doctor - drink enough fluids - notify your doctor for blood pressure readings 140/90 and above - follow low sodium diet- avoid salty snacks, choose fresh and frozen whenever possible    Why is this important?   You won't feel high  blood pressure, but it can still hurt your blood vessels.  High blood pressure can cause heart or kidney problems. It can also cause a stroke.  Making lifestyle changes like losing a little weight or eating less salt will help.  Checking your blood pressure at home and at different times of the day can help to control blood pressure.  If the doctor prescribes medicine remember to take it the way the doctor ordered.  Call the office if you cannot afford the medicine or if there are questions about it.     Notes:         Patient verbalizes understanding of instructions provided today and agrees to view in [place].   Telephone follow up appointment with care management team member scheduled for:   03/06/2021  Jacqlyn Larsen [facility], BSN RN Case Manager [facility] [phone]

## 2021-01-10 ENCOUNTER — Ambulatory Visit: Payer: Self-pay | Admitting: *Deleted

## 2021-01-10 DIAGNOSIS — E119 Type 2 diabetes mellitus without complications: Secondary | ICD-10-CM

## 2021-01-10 DIAGNOSIS — I10 Essential (primary) hypertension: Secondary | ICD-10-CM

## 2021-01-10 NOTE — Patient Instructions (Signed)
Visit Information   Goals Addressed             This Visit's Progress    Track and Manage My Blood Pressure-Hypertension       Timeframe:  Long-Range Goal Priority:  Medium Start Date:      11/28/2020                       Expected End Date:     05/31/2021                  Follow Up Date 03/06/2021   - check blood pressure 3 times per week and record in log - take log to all doctor's appointments - take fluid pill as directed by your doctor - drink enough fluids - notify your doctor for blood pressure readings 140/90 and above - follow low sodium diet- avoid salty snacks, choose fresh and frozen whenever possible - please pickup Good rx card at your doctor's office - pickup new prescription at [facility] for acyclovir to take daily    Why is this important?   You won't feel high blood pressure, but it can still hurt your blood vessels.  High blood pressure can cause heart or kidney problems. It can also cause a stroke.  Making lifestyle changes like losing a little weight or eating less salt will help.  Checking your blood pressure at home and at different times of the day can help to control blood pressure.  If the doctor prescribes medicine remember to take it the way the doctor ordered.  Call the office if you cannot afford the medicine or if there are questions about it.     Notes:         Patient verbalizes understanding of instructions provided today and agrees to view in [place].   Telephone follow up appointment with care management team member scheduled for:  03/06/2021  Jacqlyn Larsen [facility], BSN RN Case Manager [facility] [phone]

## 2021-01-10 NOTE — Chronic Care Management (AMB) (Signed)
Care Management    RN Visit Note  01/10/2021 Name: Nina Olson MRN: [id number] DOB: 1945-07-27  Subjective: Nina Olson is a 75 y.o. year old female who is a primary care patient of Eulogio Bear, NP. The care management team was consulted for assistance with disease management and care coordination needs.    Engaged with patient by telephone for follow up visit in response to provider referral for case management and/or care coordination services.   Consent to Services:   Nina Olson was given information about Care Management services today including:  Care Management services includes personalized support from designated clinical staff supervised by her physician, including individualized plan of care and coordination with other care providers 24/7 contact phone numbers for assistance for urgent and routine care needs. The patient may stop case management services at any time by phone call to the office staff.  Patient agreed to services and consent obtained.   Assessment: Review of patient past medical history, allergies, medications, health status, including review of consultants reports, laboratory and other test data, was performed as part of comprehensive evaluation and provision of chronic care management services.   SDOH (Social Determinants of Health) assessments and interventions performed:    Care Plan  No Known Allergies  Outpatient Encounter Medications as of 01/10/2021  Medication Sig   Accu-Chek Softclix Lancets lancets USE TO CHECK CBG ONCE A DAY   atorvastatin (LIPITOR) 40 MG tablet Take 1 tablet (40 mg total) by mouth daily.   blood glucose meter kit and supplies Dispense based on patient and insurance preference. Check CBG once a day (FOR ICD-10 E10.9, E11.9).   chlorhexidine (PERIDEX) 0.12 % solution    Cholecalciferol (DIALYVITE VITAMIN D 5000 PO) Take by mouth.   esomeprazole (NEXIUM) 40 MG capsule Take 1 capsule (40 mg total) by mouth daily.    glucose blood (ACCU-CHEK GUIDE) test strip USE TO CHECK CBG ONCE A DAY   hydrochlorothiazide (HYDRODIURIL) 25 MG tablet TAKE 1 TABLET EVERY DAY (Patient taking differently: Take 12.5 mg by mouth daily. Pt reports she takes half of 25 mg tablet)   ibuprofen (ADVIL) 800 MG tablet Take by mouth.   losartan (COZAAR) 100 MG tablet Take 1 tablet by mouth once daily   Misc Natural Products (NEURIVA PO) Take by mouth. Memory   Multiple Vitamin (MULTI-VITAMIN) tablet Take by mouth.   Probiotic Product (PROBIOTIC DAILY PO) Take by mouth.   valACYclovir (VALTREX) 1000 MG tablet Take 1 tablet by mouth twice daily   vitamin C (ASCORBIC ACID) 500 MG tablet Take 1 tablet (500 mg total) by mouth daily.   ZINC OXIDE PO Take by mouth.   No facility-administered encounter medications on file as of 01/10/2021.    Patient Active Problem List   Diagnosis Date Noted   Abdominal bloating 07/15/2020   Abnormal feces 07/15/2020   Colon cancer screening 07/15/2020   Family history of malignant neoplasm of gastrointestinal tract 07/15/2020   Generalized abdominal pain 07/15/2020   Personal history of colonic polyps 07/15/2020   Rectal bleeding 07/15/2020   HSV (herpes simplex virus) anogenital infection 07/15/2020   Rectal itching 07/15/2020   Bladder prolapse, female, acquired 04/28/2019   Controlled type 2 diabetes mellitus with neuropathy ([place]) 10/27/2018   OA (osteoarthritis) of hip 05/24/2017   DDD (degenerative disc disease), lumbar 05/24/2017   GERD (gastroesophageal reflux disease) 02/10/2016   Essential hypertension 07/26/2015   Hyperlipidemia 07/26/2015   Insomnia secondary to depression with anxiety 07/26/2015  Osteopenia    Palpitations    Colon polyps     Conditions to be addressed/monitored: HTN,  DM2  Care Plan : Hypertension (Adult)  Updates made by Kassie Mends, RN since 01/10/2021 12:00 AM     Problem: Hypertension (Hypertension)   Priority: Medium     Long-Range Goal:  Hypertension Monitored   Start Date: 11/28/2020  Expected End Date: 05/31/2021  Recent Progress: On track  Priority: Medium  Note:   Objective:  Last practice recorded BP readings:  BP Readings from Last 3 Encounters:  11/07/20 132/66  08/15/20 140/84  07/15/20 (!) 152/98   Most recent eGFR/CrCl:  Lab Results  Component Value Date   EGFR 57 (L) 11/07/2020    No components found for: CRCL Current Barriers:  Knowledge Deficits related to basic understanding of hypertension pathophysiology and self care management- Patient reports she has blood pressure cuff but still not checkiing blood pressure consistently, pt does try to get out and walk her dog but no other exercise, pt reports she takes all medications as prescribed.  Pt recently had medication change per MD notes, take 1/2 tablet HCTZ, drink adequate fluids, monitor blood pressure and notify MD for readings above 140/90. Pt reports she recently had outbreak of genital herpes and used valacyclovir which did help, now has new outbreak as of last hs so will get valacyclovir refilled,  pt reports there is medication prescribed in the past (take daily) that would help keep this condition under better control but pt cannot afford 52$ / month, pt cannot remember name of medication Does not adhere to provider recommendations re:  checks blood pressure occasionally Case Manager Clinical Goal(s):  patient will verbalize understanding of plan for hypertension management patient will attend all scheduled medical appointments patient will demonstrate improved adherence to prescribed treatment plan for hypertension as evidenced by taking all medications as prescribed, monitoring and recording blood pressure as directed, adhering to low sodium/DASH diet patient will verbalize basic understanding of hypertension disease process and self health management plan as evidenced by well controlled blood pressure Interventions:  Collaboration with Eulogio Bear, NP regarding development and update of comprehensive plan of care as evidenced by provider attestation and co-signature Inter-disciplinary care team collaboration (see longitudinal plan of care) Evaluation of current treatment plan related to hypertension self management and patient's adherence to plan as established by provider. Reinforced education to patient re: stroke prevention, s/s of heart attack and stroke, DASH diet, complications of uncontrolled blood pressure Reviewed medications with patient and discussed importance of compliance Reviewed plans with patient for ongoing care management follow up and provided patient with direct contact information for care management team Reinforced with patient, providing education and rationale, to monitor blood pressure daily and record, calling PCP for findings outside established parameters.  Reviewed scheduled/upcoming provider appointments including: primary care provider 03/20/2021 Reinforced importance of exercise and diet to control blood pressure Reinforced primary care provider instructions with pt-  take 1/2 tablet HCTZ, drink adequate fluids, monitor blood pressure and notify MD for readings above 140/90, pt verbalizes understanding In basket sent to primary care provider regarding name of medication prescribed in the past for genital herpes and pt interested in trying to take if pharmacist can provide resources as pt cannot afford 52$/ month. Per primary care provider pt can pickup Good rx card at their office and get new prescription for acyclovir 45m BID as a more affordable option at $30/ 3 month supply RN care manager called patient  and informed her of the above information, pt verbalizes understanding and will pickup Good rx card and prescription Self-Care Activities: Attends all scheduled provider appointments Calls provider office for new concerns, questions, or BP outside discussed parameters Checks BP and records as  discussed Follows a low sodium diet/DASH diet Patient Goals: - check blood pressure 3 times per week and record in log - take log to all doctor's appointments - take fluid pill as directed by your doctor - drink enough fluids - notify your doctor for blood pressure readings 140/90 and above - follow low sodium diet- avoid salty snacks, choose fresh and frozen whenever possible - please pickup Good rx card at your doctor's office - pickup new prescription at [facility] for acyclovir to take daily Follow Up Plan: Telephone follow up appointment with care management team member scheduled for:  03/06/2021     Plan: Telephone follow up appointment with care management team member scheduled for:  03/06/2021  Jacqlyn Larsen [employer], BSN RN Case Manager [facility] [phone]

## 2021-01-13 ENCOUNTER — Other Ambulatory Visit: Payer: Self-pay | Admitting: Nurse Practitioner

## 2021-01-13 DIAGNOSIS — A609 Anogenital herpesviral infection, unspecified: Secondary | ICD-10-CM

## 2021-01-13 MED ORDER — ACYCLOVIR 200 MG PO CAPS
200.0000 mg | ORAL_CAPSULE | Freq: Two times a day (BID) | ORAL | 1 refills | Status: AC
Start: 2021-01-13 — End: 2021-07-12

## 2021-01-13 NOTE — Progress Notes (Signed)
Cost of daily valcyclovir for daily suppressive therapy is too much for patient; will send acyclovir and Goodrx coupon given let in front office for patient.

## 2021-01-16 ENCOUNTER — Telehealth: Payer: Medicare HMO

## 2021-01-16 DIAGNOSIS — F341 Dysthymic disorder: Secondary | ICD-10-CM | POA: Diagnosis not present

## 2021-01-24 DIAGNOSIS — F331 Major depressive disorder, recurrent, moderate: Secondary | ICD-10-CM | POA: Diagnosis not present

## 2021-02-01 DIAGNOSIS — F331 Major depressive disorder, recurrent, moderate: Secondary | ICD-10-CM | POA: Diagnosis not present

## 2021-02-06 ENCOUNTER — Other Ambulatory Visit: Payer: Self-pay

## 2021-02-06 DIAGNOSIS — I10 Essential (primary) hypertension: Secondary | ICD-10-CM

## 2021-02-06 DIAGNOSIS — E1140 Type 2 diabetes mellitus with diabetic neuropathy, unspecified: Secondary | ICD-10-CM

## 2021-02-06 DIAGNOSIS — E119 Type 2 diabetes mellitus without complications: Secondary | ICD-10-CM | POA: Diagnosis not present

## 2021-02-06 DIAGNOSIS — F418 Other specified anxiety disorders: Secondary | ICD-10-CM

## 2021-02-06 MED ORDER — ACCU-CHEK GUIDE VI STRP: ORAL_STRIP | 6 refills | Status: DC

## 2021-02-09 DIAGNOSIS — F331 Major depressive disorder, recurrent, moderate: Secondary | ICD-10-CM | POA: Diagnosis not present

## 2021-02-17 ENCOUNTER — Telehealth: Payer: Medicare HMO

## 2021-02-20 DIAGNOSIS — F331 Major depressive disorder, recurrent, moderate: Secondary | ICD-10-CM | POA: Diagnosis not present

## 2021-02-22 ENCOUNTER — Other Ambulatory Visit: Payer: Self-pay | Admitting: *Deleted

## 2021-02-22 DIAGNOSIS — E1140 Type 2 diabetes mellitus with diabetic neuropathy, unspecified: Secondary | ICD-10-CM

## 2021-02-22 MED ORDER — ACCU-CHEK GUIDE VI STRP: ORAL_STRIP | 6 refills | Status: AC

## 2021-03-06 ENCOUNTER — Ambulatory Visit (INDEPENDENT_AMBULATORY_CARE_PROVIDER_SITE_OTHER): Payer: Medicare HMO | Admitting: *Deleted

## 2021-03-06 DIAGNOSIS — I10 Essential (primary) hypertension: Secondary | ICD-10-CM

## 2021-03-06 DIAGNOSIS — E119 Type 2 diabetes mellitus without complications: Secondary | ICD-10-CM

## 2021-03-06 NOTE — Patient Instructions (Signed)
Visit Information  Thank you for taking time to visit with me today. Please don't hesitate to contact me if I can be of assistance to you before our next scheduled telephone appointment.  Following are the goals we discussed today:   Visit Information  Thank you for taking time to visit with me today. Please don't hesitate to contact me if I can be of assistance to you before our next scheduled telephone appointment.  Following are the goals we discussed today:  Take all medications as prescribed Attend all scheduled provider appointments Perform all self care activities independently  Call provider office for new concerns or questions  Continue walking outdoors with your dog- keep up the good work Be mindful of carbohydrate intake- too much bread, pasta, rice, potatoes at one meal will elevate blood sugar Check on new blood pressure cuff from [facility] benefits catalog Check blood pressure at least 3 x per week and record in a log Follow low sodium diet- avoid fast food and salty snacks  Our next appointment is by telephone on 05/12/2021 at 900 am  Please call the care guide team at [phone] if you need to cancel or reschedule your appointment.   If you are experiencing a Mental Health or [place] or need someone to talk to, please call the [place] National Suicide Prevention Lifeline: (224)603-6635 or TTY: 450-246-7998 TTY 769-651-2437) to talk to a trained counselor call 1-800-273-TALK (toll free, 24 hour hotline) go to [facility] [address], [place] [phone]) call 911   Patient verbalizes understanding of instructions provided today and agrees to view in [place].  Jacqlyn Larsen RNC, BSN RN Case Manager [facility] [phone]

## 2021-03-06 NOTE — Chronic Care Management (AMB) (Signed)
Chronic Care Management   CCM RN Visit Note  03/06/2021 Name: Nina Olson MRN: [id number] DOB: 04-Sep-1945  Subjective: Nina Olson is a 75 y.o. year old female who is a primary care patient of Eulogio Bear, NP. The care management team was consulted for assistance with disease management and care coordination needs.    Engaged with patient by telephone for follow up visit in response to provider referral for case management and/or care coordination services.   Consent to Services:  The patient was given information about Chronic Care Management services, agreed to services, and gave verbal consent prior to initiation of services.  Please see initial visit note for detailed documentation.   Patient agreed to services and verbal consent obtained.   Assessment: Review of patient past medical history, allergies, medications, health status, including review of consultants reports, laboratory and other test data, was performed as part of comprehensive evaluation and provision of chronic care management services.   SDOH (Social Determinants of Health) assessments and interventions performed:    CCM Care Plan  Not on File  Outpatient Encounter Medications as of 03/06/2021  Medication Sig   Accu-Chek Softclix Lancets lancets USE TO CHECK CBG ONCE A DAY   acyclovir (ZOVIRAX) 200 MG capsule Take 1 capsule (200 mg total) by mouth 2 (two) times daily.   atorvastatin (LIPITOR) 40 MG tablet Take 1 tablet (40 mg total) by mouth daily.   blood glucose meter kit and supplies Dispense based on patient and insurance preference. Check CBG once a day (FOR ICD-10 E10.9, E11.9).   chlorhexidine (PERIDEX) 0.12 % solution    Cholecalciferol (DIALYVITE VITAMIN D 5000 PO) Take by mouth.   esomeprazole (NEXIUM) 40 MG capsule Take 1 capsule (40 mg total) by mouth daily.   glucose blood (ACCU-CHEK GUIDE) test strip USE TO CHECK CBG ONCE A DAY   hydrochlorothiazide (HYDRODIURIL) 25 MG tablet TAKE 1  TABLET EVERY DAY (Patient taking differently: Take 12.5 mg by mouth daily. Pt reports she takes half of 25 mg tablet)   ibuprofen (ADVIL) 800 MG tablet Take by mouth.   losartan (COZAAR) 100 MG tablet Take 1 tablet by mouth once daily   Misc Natural Products (NEURIVA PO) Take by mouth. Memory   Multiple Vitamin (MULTI-VITAMIN) tablet Take by mouth.   Probiotic Product (PROBIOTIC DAILY PO) Take by mouth.   vitamin C (ASCORBIC ACID) 500 MG tablet Take 1 tablet (500 mg total) by mouth daily.   ZINC OXIDE PO Take by mouth.   No facility-administered encounter medications on file as of 03/06/2021.    Patient Active Problem List   Diagnosis Date Noted   Abdominal bloating 07/15/2020   Abnormal feces 07/15/2020   Colon cancer screening 07/15/2020   Family history of malignant neoplasm of gastrointestinal tract 07/15/2020   Generalized abdominal pain 07/15/2020   Personal history of colonic polyps 07/15/2020   Rectal bleeding 07/15/2020   HSV (herpes simplex virus) anogenital infection 07/15/2020   Rectal itching 07/15/2020   Bladder prolapse, female, acquired 04/28/2019   Controlled type 2 diabetes mellitus with neuropathy ([place]) 10/27/2018   OA (osteoarthritis) of hip 05/24/2017   DDD (degenerative disc disease), lumbar 05/24/2017   GERD (gastroesophageal reflux disease) 02/10/2016   Essential hypertension 07/26/2015   Hyperlipidemia 07/26/2015   Insomnia secondary to depression with anxiety 07/26/2015   Osteopenia    Palpitations    Colon polyps     Conditions to be addressed/monitored:HTN and DMII  Care Plan : RN Care Manager plan  of care  Updates made by Kassie Mends, RN since 03/06/2021 12:00 AM     Problem: No plan of care established for management of chronic disease states (HTN, DM2)   Priority: High     Long-Range Goal: Development of plan of care for chronic disease management (HTN, DM2)   Priority: High  Note:   Current Barriers:  Knowledge Deficits related to  plan of care for management of HTN and DMII  Knowledge Deficits related to basic Diabetes pathophysiology and self care/management- pt needs reinforcement for ADA/ carbohydrate modified diet, pt does not always eat enough vegetables, drinks sugary drinks at times and reports she would like to do better, pt gets outside and walks her dog daily, used to go to [employer]. Pt has been checking CBG approximately 2 x per week with readings all in low 100's range (108, 114).  Pt reports she lives with spouse, is independent with all aspects of her care, has episodes of depression at times, has had counseling through her church in the past, did not like the way medication for depression made her feel.  Pt is working with LCSW for depression management, pt reports she did receive advanced directives packet but has not completed. Patient reports she does have acyclovir and is taking daily as prescribed (obtained with Good Rx card and was able to afford) Does not adhere to provider recommendations re:  carbohydrate modified diet. Knowledge Deficits related to basic understanding of hypertension pathophysiology and self care management- Patient reports she has blood pressure cuff but still not checking blood pressure consistently, reports her cuff is old and she may get new cuff from [facility] benefit catalog.  RNCM Clinical Goal(s):  Patient will verbalize understanding of plan for management of HTN and DMII as evidenced by patient report, review of EHR and  through collaboration with RN Care manager, provider, and care team.   Interventions: 1:1 collaboration with primary care provider regarding development and update of comprehensive plan of care as evidenced by provider attestation and co-signature Inter-disciplinary care team collaboration (see longitudinal plan of care) Evaluation of current treatment plan related to  self management and patient's adherence to plan as established by provider   Diabetes  Interventions:  (Status:  Goal on track:  Yes.) Long Term Goal Assessed patient's understanding of A1c goal: <7% Reviewed medications with patient and discussed importance of medication adherence Review of patient status, including review of consultants reports, relevant laboratory and other test results, and medications completed Reinforced carbohydrate modified diet, portion control Reviewed CBG log with patient Reviewed importance of taking acyclovir as prescribed Lab Results  Component Value Date   HGBA1C 6.9 (H) 11/07/2020   Hypertension Interventions:  (Status:  Goal on track:  Yes.) Long Term Goal Last practice recorded BP readings:  BP Readings from Last 3 Encounters:  12/19/20 118/68  11/07/20 132/66  08/15/20 140/84  Most recent eGFR/CrCl:  Lab Results  Component Value Date   EGFR 62 12/19/2020    No components found for: CRCL  Advised patient, providing education and rationale, to monitor blood pressure daily and record, calling PCP for findings outside established parameters Reinforced low sodium diet and importance of reading food labels, avoiding salty snacks and fast food Encouraged patient with her exercising outdoors/ walking Encouraged patient to complete advanced directives Reviewed importance of taking medications as prescribed  Patient Goals/Self-Care Activities: Take all medications as prescribed Attend all scheduled provider appointments Perform all self care activities independently  Call provider office for  new concerns or questions  Continue walking outdoors with your dog- keep up the good work Be mindful of carbohydrate intake- too much bread, pasta, rice, potatoes at one meal will elevate blood sugar Check on new blood pressure cuff from [facility] benefits catalog Check blood pressure at least 3 x per week and record in a log Follow low sodium diet- avoid fast food and salty snacks  Follow Up Plan:  Telephone follow up appointment with care management  team member scheduled for:  05/12/2021      Plan:Telephone follow up appointment with care management team member scheduled for:  05/12/2021  Jacqlyn Larsen [facility], BSN RN Case Manager [facility] [phone]

## 2021-03-08 DIAGNOSIS — E119 Type 2 diabetes mellitus without complications: Secondary | ICD-10-CM | POA: Diagnosis not present

## 2021-03-08 DIAGNOSIS — I10 Essential (primary) hypertension: Secondary | ICD-10-CM

## 2021-03-15 DIAGNOSIS — F331 Major depressive disorder, recurrent, moderate: Secondary | ICD-10-CM | POA: Diagnosis not present

## 2021-03-20 ENCOUNTER — Ambulatory Visit (INDEPENDENT_AMBULATORY_CARE_PROVIDER_SITE_OTHER): Payer: Medicare HMO | Admitting: Nurse Practitioner

## 2021-03-20 ENCOUNTER — Other Ambulatory Visit: Payer: Self-pay

## 2021-03-20 ENCOUNTER — Encounter: Payer: Self-pay | Admitting: Nurse Practitioner

## 2021-03-20 VITALS — BP 120/80 | HR 94 | Ht 65.0 in | Wt 175.0 lb

## 2021-03-20 DIAGNOSIS — Z0001 Encounter for general adult medical examination with abnormal findings: Secondary | ICD-10-CM | POA: Diagnosis not present

## 2021-03-20 DIAGNOSIS — E119 Type 2 diabetes mellitus without complications: Secondary | ICD-10-CM | POA: Diagnosis not present

## 2021-03-20 DIAGNOSIS — F418 Other specified anxiety disorders: Secondary | ICD-10-CM

## 2021-03-20 DIAGNOSIS — Z0000 Encounter for general adult medical examination without abnormal findings: Secondary | ICD-10-CM

## 2021-03-20 DIAGNOSIS — F5105 Insomnia due to other mental disorder: Secondary | ICD-10-CM

## 2021-03-20 DIAGNOSIS — I10 Essential (primary) hypertension: Secondary | ICD-10-CM

## 2021-03-20 MED ORDER — TRAZODONE HCL 50 MG PO TABS: 25.0000 mg | ORAL_TABLET | Freq: Every evening | ORAL | 1 refills | Status: DC | PRN

## 2021-03-20 NOTE — Progress Notes (Signed)
Patient: Nina Olson, Female    DOB: [birth year], 75 y.o.   MRN: [id number]  Visit Date: 03/20/2021  Today's Provider: Eulogio Bear, NP   Chief Complaint  Patient presents with   Annual Exam    Subjective:   Nina Olson is a 75 y.o. female who presents today for her Subsequent Annual Wellness Visit.  Care Team:   Primary Care Provider - Noemi Chapel, NP Podiatry - Hardie Pulley, DPM  HPI  Depression  Started seeing a Therapist a few months ago.  She is doing [employer] every 2 weeks and thinks this is going well.  She was prescribed medication including Mirtizapine and trazodone in the past and is interested in discussing medication today.    Also reports increase in gas/bloating/indigestion when she lays down in bed at night.  She does frequently eat snacks right before bed.  She is taking Nexium 40 mg daily on an empty stomach.  Review of Systems  Constitutional: Negative.  Negative for activity change and appetite change.  HENT: Negative.    Respiratory: Negative.    Cardiovascular: Negative.   Gastrointestinal:  Negative for abdominal pain, nausea and vomiting.       + indigestion  Musculoskeletal: Negative.   Skin: Negative.   Neurological: Negative.   Psychiatric/Behavioral:  Positive for decreased concentration and sleep disturbance. Negative for confusion and suicidal ideas. The patient is not nervous/anxious.    Past Medical History:  Diagnosis Date   Colon polyps    GERD (gastroesophageal reflux disease)    Osteopenia    Palpitations     Past Surgical History:  Procedure Laterality Date   CESAREAN SECTION      Family History  Problem Relation Age of Onset   Hypertension Mother    Osteoporosis Mother     Social History   Socioeconomic History   Marital status: Married    Spouse name: Not on file   Number of children: Not on file   Years of education: Not on file   Highest education level: Not on file  Occupational History   Not  on file  Tobacco Use   Smoking status: Former   Smokeless tobacco: Former    Quit date: 04/09/1978  Substance and Sexual Activity   Alcohol use: No   Drug use: No   Sexual activity: Never  Other Topics Concern   Not on file  Social History Narrative   Not on file   Social Determinants of Health   Financial Resource Strain: Not on file  Food Insecurity: No Food Insecurity   Worried About Charity fundraiser in the Last Year: Never true   [place] in the Last Year: Never true  Transportation Needs: No Transportation Needs   Lack of Transportation (Medical): No   Lack of Transportation (Non-Medical): No  Physical Activity: Not on file  Stress: Not on file  Social Connections: Not on file  Intimate Partner Violence: Not on file    Outpatient Encounter Medications as of 03/20/2021  Medication Sig   Accu-Chek Softclix Lancets lancets USE TO CHECK CBG ONCE A DAY   acyclovir (ZOVIRAX) 200 MG capsule Take 1 capsule (200 mg total) by mouth 2 (two) times daily.   atorvastatin (LIPITOR) 40 MG tablet Take 1 tablet (40 mg total) by mouth daily.   B Complex Vitamins (B COMPLEX PO) Take by mouth as directed.   blood glucose meter kit and supplies Dispense based on patient and insurance preference. Check  CBG once a day (FOR ICD-10 E10.9, E11.9).   chlorhexidine (PERIDEX) 0.12 % solution    esomeprazole (NEXIUM) 40 MG capsule Take 1 capsule (40 mg total) by mouth daily.   glucose blood (ACCU-CHEK GUIDE) test strip USE TO CHECK CBG ONCE A DAY   hydrochlorothiazide (HYDRODIURIL) 25 MG tablet TAKE 1 TABLET EVERY DAY (Patient taking differently: Take 12.5 mg by mouth daily. Pt reports she takes half of 25 mg tablet)   ibuprofen (ADVIL) 800 MG tablet Take by mouth.   losartan (COZAAR) 100 MG tablet Take 1 tablet by mouth once daily   Multiple Vitamin (MULTI-VITAMIN) tablet Take by mouth.   Omega-3 Fatty Acids (FISH OIL PO) Take by mouth as directed.   Probiotic Product (PROBIOTIC DAILY PO)  Take by mouth.   traZODone (DESYREL) 50 MG tablet Take 0.5 tablets (25 mg total) by mouth at bedtime as needed for sleep.   vitamin C (ASCORBIC ACID) 500 MG tablet Take 1 tablet (500 mg total) by mouth daily.   ZINC OXIDE PO Take by mouth.   [DISCONTINUED] Cholecalciferol (DIALYVITE VITAMIN D 5000 PO) Take by mouth.   [DISCONTINUED] glucose blood (ACCU-CHEK GUIDE) test strip USE TO CHECK CBG ONCE A DAY   [DISCONTINUED] Misc Natural Products (NEURIVA PO) Take by mouth. Memory   [DISCONTINUED] valACYclovir (VALTREX) 1000 MG tablet Take 0.5 tablets (500 mg total) by mouth daily.   No facility-administered encounter medications on file as of 03/20/2021.    Functional Status Survey: Is the patient deaf or have difficulty hearing?: No Does the patient have difficulty seeing, even when wearing glasses/contacts?: No Does the patient have difficulty concentrating, remembering, or making decisions?: No Does the patient have difficulty walking or climbing stairs?: No Does the patient have difficulty dressing or bathing?: No Does the patient have difficulty doing errands alone such as visiting a doctor's office or shopping?: No   Fall Risk Assessment Fall Risk  03/20/2021 12/19/2020 11/28/2020 11/28/2020 11/16/2020  Falls in the past year? 0 0 0 0 0  Number falls in past yr: 0 0 - - 0  Injury with Fall? 0 0 - - 0  Risk for fall due to : - - - - -  Follow up - Falls evaluation completed - - -    Depression Screen Depression screen [facility] 2/9 03/20/2021 01/09/2021 11/28/2020 11/28/2020 08/15/2020  Decreased Interest _0 0 0  Down, Depressed, Hopeless _1 0  PHQ - 2 Score _2 0  Altered sleeping _3 - -  Tired, decreased energy _4 - -  Change in appetite 0 1 0 - -  Feeling bad or failure about yourself  1 2 0 - -  Trouble concentrating _5 - -  Moving slowly or fidgety/restless 1 1 0 - -  Suicidal thoughts 0 0 0 - -  PHQ-9 Score _6 - -  Difficult doing work/chores - Somewhat  difficult Somewhat difficult - -  Some recent data might be hidden    6CIT Screen 03/20/2021 11/16/2020  What Year? 0 points 0 points  What month? 0 points 0 points  What time? 0 points 0 points  Count back from 20 0 points 0 points  Months in reverse 0 points 0 points  Repeat phrase 0 points 0 points  Total Score 0 0    Advanced Directives Does patient have a HCPOA?    no If yes, name and contact information: n/a Does  patient have a living will or MOST form?  No Advanced Directive handout given today  Objective:   Vitals: BP 120/80   Pulse 94   Ht _0  (1.651 m)   Wt 175 lb (79.4 kg)   SpO2 97%   BMI 29.12 kg/m  Body mass index is 29.12 kg/m. No results found.  Physical Exam Vitals and nursing note reviewed.  Constitutional:      Appearance: Normal appearance.  Eyes:     General: No scleral icterus.    Extraocular Movements: Extraocular movements intact.  Neck:     Vascular: No carotid bruit.  Cardiovascular:     Rate and Rhythm: Normal rate and regular rhythm.     Heart sounds: Normal heart sounds. No murmur heard. Pulmonary:     Effort: Pulmonary effort is normal. No respiratory distress.     Breath sounds: Normal breath sounds. No wheezing, rhonchi or rales.  Abdominal:     General: Abdomen is flat. Bowel sounds are normal.     Palpations: Abdomen is soft.  Musculoskeletal:     Cervical back: Normal range of motion.     Right lower leg: No edema.     Left lower leg: No edema.  Skin:    General: Skin is warm and dry.     Coloration: Skin is not jaundiced or pale.     Findings: No erythema.  Neurological:     Mental Status: She is alert and oriented to person, place, and time.     Motor: No weakness.     Coordination: Coordination normal.     Gait: Gait normal.    Assessment & Plan:  1. Encounter for annual wellness visit (AWV) in Medicare patient  2. Essential hypertension Chronic.  BP is stable today in clinic.  Encouraged increasing water  intake - goal is 64 oz daily.  Will check electrolytes with kidney function and blood counts.  Follow up pending blood work.  - COMPLETE METABOLIC PANEL WITH GFR - CBC with Differential/Platelet  3. Type 2 diabetes mellitus without complication, without long-term current use of insulin (HCC) Chronic.  Previously diet controlled.  A1c goal is less than 7.5% given age and co morbidities - want to prevent hypoglycemic episodes.  She is tolerating high intensity statin well.  Foot and eye exam are up to date.  Follow up pending blood work.  - COMPLETE METABOLIC PANEL WITH GFR - Lipid panel - Hemoglobin A1c  4. Depression with anxiety Chronic.  PHQ-9 remains elevated today.  She is following with Counselor, continue collaboration.  In addition to counseling, patient is interested in medication.  Will plan to start Trazodone 25 mg (1/2 tablet) nightly before bed.  Encouraged consistency of taking this medication for full benefit.  If no benefit after 2 weeks, can increase to 50 mg (1 tablet).  Follow up in 6 weeks to reassess mood or sooner if needed.  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  Start Trazodone 25 mg nightly before bed.  If no benefit after 2 weeks, can increase to Trazodone 50 mg nightly.  Follow up in 6 weeks to see how this is going.   - traZODone (DESYREL) 50 MG tablet; Take 0.5 tablets (25 mg total) by mouth at bedtime as needed for sleep.  Dispense: 30 tablet; Refill: 1     Reviewed patient's Family Medical History  Reviewed and updated list of patient's medical providers  Assessment of cognitive impairment was done  Assessed patient's functional ability  Established a written schedule for health screening [facility] Completed and Reviewed  Health Maintenance reviewed - I encouraged shingles and COVID vaccine; patient encouraged to schedule at pharmacy if she is interested in these  vaccines.    Immunization History  Administered Date(s) Administered   Fluad Quad(high Dose 65+) 02/02/2019, 01/09/2021   Influenza, High Dose Seasonal PF 01/26/2017, 02/12/2018   Influenza,inj,Quad PF,6+ Mos 03/19/2013, 02/10/2016   Influenza-Unspecified 12/31/2007, 12/30/2013   PFIZER(Purple Top)SARS-COV-2 Vaccination 05/14/2019, 06/04/2019, 01/14/2020   Pneumococcal Conjugate-13 03/19/2013   Pneumococcal Polysaccharide-23 02/19/2012, 01/01/2014   Tdap 05/26/2014   Zoster, Live 07/27/2015    Health Maintenance  Topic Date Due   Zoster Vaccines- Shingrix (1 of 2) Never done   COVID-19 Vaccine (4 - Booster for Pfizer series) 03/10/2020   HEMOGLOBIN A1C  05/10/2021   OPHTHALMOLOGY EXAM  08/10/2021   FOOT EXAM  12/19/2021   COLONOSCOPY (Pts 45-76yr Insurance coverage will need to be confirmed)  02/03/2022   MAMMOGRAM  09/14/2022   TETANUS/TDAP  05/26/2024   Pneumonia Vaccine 75 Years old  Completed   INFLUENZA VACCINE  Completed   DEXA SCAN  Completed   Hepatitis C Screening  Completed   HPV VACCINES  Aged Out    Discussed health benefits of physical activity, and encouraged her to engage in regular exercise appropriate for her age and condition.   Meds ordered this encounter  Medications   traZODone (DESYREL) 50 MG tablet    Sig: Take 0.5 tablets (25 mg total) by mouth at bedtime as needed for sleep.    Dispense:  30 tablet    Refill:  1    Order Specific Question:   Supervising Provider    Answer:   PJenna LuoT [[id number]]     Current Outpatient Medications:    Accu-Chek Softclix Lancets lancets, USE TO CHECK CBG ONCE A DAY, Disp: 100 each, Rfl: 0   acyclovir (ZOVIRAX) 200 MG capsule, Take 1 capsule (200 mg total) by mouth 2 (two) times daily., Disp: 180 capsule, Rfl: 1   atorvastatin (LIPITOR) 40 MG tablet, Take 1 tablet (40 mg total) by mouth daily., Disp: 90 tablet, Rfl: 1   B Complex Vitamins (B COMPLEX PO), Take by mouth as directed., Disp: , Rfl:    blood  glucose meter kit and supplies, Dispense based on patient and insurance preference. Check CBG once a day (FOR ICD-10 E10.9, E11.9)., Disp: 1 each, Rfl: 0   chlorhexidine (PERIDEX) 0.12 % solution, , Disp: , Rfl:    esomeprazole (NEXIUM) 40 MG capsule, Take 1 capsule (40 mg total) by mouth daily., Disp: 90 capsule, Rfl: 1   glucose blood (ACCU-CHEK GUIDE) test strip, USE TO CHECK CBG ONCE A DAY, Disp: 100 each, Rfl: 6   hydrochlorothiazide (HYDRODIURIL) 25 MG tablet, TAKE 1 TABLET EVERY DAY (Patient taking differently: Take 12.5 mg by mouth daily. Pt reports she takes half of 25 mg tablet), Disp: 90 tablet, Rfl: 0   ibuprofen (ADVIL) 800 MG tablet, Take by mouth., Disp: , Rfl:    losartan (COZAAR) 100 MG tablet, Take 1 tablet by mouth once daily, Disp: 90 tablet, Rfl: 0   Multiple Vitamin (MULTI-VITAMIN) tablet, Take by mouth., Disp: , Rfl:    Omega-3 Fatty Acids (FISH OIL PO), Take by mouth as directed., Disp: , Rfl:    Probiotic Product (PROBIOTIC DAILY PO), Take by mouth., Disp: , Rfl:    traZODone (DESYREL) 50 MG tablet, Take 0.5 tablets (  25 mg total) by mouth at bedtime as needed for sleep., Disp: 30 tablet, Rfl: 1   vitamin C (ASCORBIC ACID) 500 MG tablet, Take 1 tablet (500 mg total) by mouth daily., Disp: , Rfl:    ZINC OXIDE PO, Take by mouth., Disp: , Rfl:  Medications Discontinued During This Encounter  Medication Reason   Cholecalciferol (DIALYVITE VITAMIN D 5000 PO) Completed Course   Misc Natural Products (NEURIVA PO) Patient Preference    Next Medicare Wellness Visit in 12+ months

## 2021-03-21 ENCOUNTER — Telehealth: Payer: Self-pay

## 2021-03-21 DIAGNOSIS — I10 Essential (primary) hypertension: Secondary | ICD-10-CM

## 2021-03-21 LAB — COMPLETE METABOLIC PANEL WITH GFR
AG Ratio: 1.4 (calc) (ref 1.0–2.5)
ALT: 23 U/L (ref 6–29)
AST: 19 U/L (ref 10–35)
Albumin: 4.1 g/dL (ref 3.6–5.1)
Alkaline phosphatase (APISO): 102 U/L (ref 37–153)
BUN: 17 mg/dL (ref 7–25)
CO2: 31 mmol/L (ref 20–32)
Calcium: 9.5 mg/dL (ref 8.6–10.4)
Chloride: 105 mmol/L (ref 98–110)
Creat: 0.9 mg/dL (ref 0.60–1.00)
Globulin: 2.9 g/dL (calc) (ref 1.9–3.7)
Glucose, Bld: 96 mg/dL (ref 65–99)
Potassium: 4.5 mmol/L (ref 3.5–5.3)
Sodium: 144 mmol/L (ref 135–146)
Total Bilirubin: 0.4 mg/dL (ref 0.2–1.2)
Total Protein: 7 g/dL (ref 6.1–8.1)
eGFR: 67 mL/min/{1.73_m2} (ref >=60)

## 2021-03-21 LAB — CBC WITH DIFFERENTIAL/PLATELET
Absolute Monocytes: 610 cells/uL (ref 200–950)
Basophils Absolute: 29 cells/uL (ref 0–200)
Basophils Relative: 0.6 %
Eosinophils Absolute: 293 cells/uL (ref 15–500)
Eosinophils Relative: 6.1 %
HCT: 41 % (ref 35.0–45.0)
Hemoglobin: 13.5 g/dL (ref 11.7–15.5)
Lymphs Abs: 1555 cells/uL (ref 850–3900)
MCH: 28.6 pg (ref 27.0–33.0)
MCHC: 32.9 g/dL (ref 32.0–36.0)
MCV: 86.9 fL (ref 80.0–100.0)
MPV: 9.6 fL (ref 7.5–12.5)
Monocytes Relative: 12.7 %
Neutro Abs: 2314 cells/uL (ref 1500–7800)
Neutrophils Relative %: 48.2 %
Platelets: 294 10*3/uL (ref 140–400)
RBC: 4.72 10*6/uL (ref 3.80–5.10)
RDW: 13.3 % (ref 11.0–15.0)
Total Lymphocyte: 32.4 %
WBC: 4.8 10*3/uL (ref 3.8–10.8)

## 2021-03-21 LAB — LIPID PANEL
Cholesterol: 154 mg/dL (ref <200)
HDL: 47 mg/dL — ABNORMAL LOW (ref >=50)
LDL Cholesterol (Calc): 90 mg/dL (calc)
Non-HDL Cholesterol (Calc): 107 mg/dL (calc) (ref <130)
Total CHOL/HDL Ratio: 3.3 (calc) (ref <5.0)
Triglycerides: 76 mg/dL (ref <150)

## 2021-03-21 LAB — HEMOGLOBIN A1C
Hgb A1c MFr Bld: 6.7 % of total Hgb — ABNORMAL HIGH (ref <5.7)
Mean Plasma Glucose: 146 mg/dL
eAG (mmol/L): 8.1 mmol/L

## 2021-03-21 MED ORDER — LOSARTAN POTASSIUM 100 MG PO TABS: 100.0000 mg | ORAL_TABLET | Freq: Every day | ORAL | 0 refills | Status: DC

## 2021-03-21 NOTE — Telephone Encounter (Signed)
Pt called in requesting a refill of losartan (COZAAR) 100 MG tablet   Cb#: [id number]

## 2021-03-22 ENCOUNTER — Other Ambulatory Visit: Payer: Self-pay

## 2021-03-22 DIAGNOSIS — I10 Essential (primary) hypertension: Secondary | ICD-10-CM

## 2021-03-22 MED ORDER — LOSARTAN POTASSIUM 100 MG PO TABS: 100.0000 mg | ORAL_TABLET | Freq: Every day | ORAL | 0 refills | Status: DC

## 2021-03-22 NOTE — Telephone Encounter (Signed)
Pt called and asked for rx to be sent to [facility] as she is out of medication. Rx sent per request.

## 2021-03-27 DIAGNOSIS — F331 Major depressive disorder, recurrent, moderate: Secondary | ICD-10-CM | POA: Diagnosis not present

## 2021-04-07 ENCOUNTER — Telehealth: Payer: Medicare HMO

## 2021-04-10 ENCOUNTER — Other Ambulatory Visit: Payer: Self-pay | Admitting: Nurse Practitioner

## 2021-05-01 ENCOUNTER — Ambulatory Visit (INDEPENDENT_AMBULATORY_CARE_PROVIDER_SITE_OTHER): Payer: Medicare PPO | Admitting: Nurse Practitioner

## 2021-05-01 ENCOUNTER — Encounter: Payer: Self-pay | Admitting: Nurse Practitioner

## 2021-05-01 ENCOUNTER — Other Ambulatory Visit: Payer: Self-pay

## 2021-05-01 DIAGNOSIS — F5105 Insomnia due to other mental disorder: Secondary | ICD-10-CM | POA: Diagnosis not present

## 2021-05-01 DIAGNOSIS — F418 Other specified anxiety disorders: Secondary | ICD-10-CM

## 2021-05-01 MED ORDER — TRAZODONE HCL 50 MG PO TABS: 25.0000 mg | ORAL_TABLET | Freq: Every evening | ORAL | 1 refills | Status: DC | PRN

## 2021-05-01 NOTE — Assessment & Plan Note (Signed)
Chronic.  Improved with Trazodone.  Plan to continue Trazodone and collaboration with counseling.  PHQ-9 and GAD-7 are stable today.  No SI/HI.  Refills given.

## 2021-05-01 NOTE — Progress Notes (Signed)
Subjective:    Patient ID: Nina Olson, female    DOB: 1945-12-16, 76 y.o.   MRN: [id number]  HPI: Nina Olson is a 76 y.o. female presenting for  Chief Complaint  Patient presents with   Depression   Insomnia   INSOMNIA & DEPRESSION/ANXIETY Has been taking 1/2 to 1 tablet of Trazodone and does feel it is helping her sleep better.  She wakes up around 4 am and sometimes she is able to fall back asleep.  If she can't fall asleep, she will read or scroll on her phone..  She is still meeting with Counselor - next appointment is this afternoon.  Depression screen [facility] 2/9 05/01/2021 03/20/2021 01/09/2021 11/28/2020 11/28/2020  Decreased Interest _0 0  Down, Depressed, Hopeless _1 PHQ - 2 Score _2 Altered sleeping _3 -  Tired, decreased energy _4 -  Change in appetite 1 0 1 0 -  Feeling bad or failure about yourself  _5 0 -  Trouble concentrating _6 -  Moving slowly or fidgety/restless 0 1 1 0 -  Suicidal thoughts 0 0 0 0 -  PHQ-9 Score _7 -  Difficult doing work/chores Somewhat difficult - Somewhat difficult Somewhat difficult -  Some recent data might be hidden    GAD 7 : Generalized Anxiety Score 05/01/2021  Nervous, Anxious, on Edge 2  Control/stop worrying 2  Worry too much - different things 2  Trouble relaxing 2  Restless 1  Easily annoyed or irritable 1  Afraid - awful might happen 1  Total GAD 7 Score 11  Anxiety Difficulty Somewhat difficult   No Known Allergies  Outpatient Encounter Medications as of 05/01/2021  Medication Sig   Accu-Chek Softclix Lancets lancets USE TO CHECK CBG ONCE A DAY   acyclovir (ZOVIRAX) 200 MG capsule Take 1 capsule (200 mg total) by mouth 2 (two) times daily.   atorvastatin (LIPITOR) 40 MG tablet Take 1 tablet (40 mg total) by mouth daily.   B Complex Vitamins (B COMPLEX PO) Take by mouth as directed.   blood glucose meter kit and supplies Dispense based on patient and insurance  preference. Check CBG once a day (FOR ICD-10 E10.9, E11.9).   chlorhexidine (PERIDEX) 0.12 % solution    esomeprazole (NEXIUM) 40 MG capsule Take 1 capsule (40 mg total) by mouth daily.   glucose blood (ACCU-CHEK GUIDE) test strip USE TO CHECK CBG ONCE A DAY   hydrochlorothiazide (HYDRODIURIL) 25 MG tablet Take 0.5 tablets (12.5 mg total) by mouth daily. Pt reports she takes half of 25 mg tablet   ibuprofen (ADVIL) 800 MG tablet Take by mouth.   losartan (COZAAR) 100 MG tablet Take 1 tablet (100 mg total) by mouth daily.   Multiple Vitamin (MULTI-VITAMIN) tablet Take by mouth.   Omega-3 Fatty Acids (FISH OIL PO) Take by mouth as directed.   Probiotic Product (PROBIOTIC DAILY PO) Take by mouth.   vitamin C (ASCORBIC ACID) 500 MG tablet Take 1 tablet (500 mg total) by mouth daily.   ZINC OXIDE PO Take by mouth.   [DISCONTINUED] traZODone (DESYREL) 50 MG tablet Take 0.5 tablets (25 mg total) by mouth at bedtime as needed for sleep.   traZODone (DESYREL) 50 MG tablet Take 0.5-1 tablets (25-50 mg total) by mouth at bedtime as needed for sleep.   No facility-administered encounter  medications on file as of 05/01/2021.    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 Interim medical history since our last visit reviewed.  Review of Systems Per HPI unless specifically indicated above     Objective:    BP 120/66    Pulse 94    Ht 5' 5" (1.651 m)    Wt 176 lb (79.8 kg)    SpO2 100%    BMI 29.29 kg/m   Wt Readings from Last 3 Encounters:  05/01/21 176 lb (79.8 kg)  03/20/21 175 lb (79.4 kg)  12/19/20 171 lb 12.8 oz (77.9 kg)    Physical Exam Vitals and nursing note reviewed.  Constitutional:      General: She is not in acute distress.    Appearance: Normal appearance. She is not toxic-appearing.  Skin:    Coloration: Skin is not jaundiced or pale.     Findings: No erythema.  Neurological:     Mental Status: She is alert and oriented to person, place, and time.     Motor: No weakness.     Gait: Gait normal.  Psychiatric:        Mood and Affect: Mood normal.        Behavior: Behavior normal.        Thought Content: Thought content normal.        Judgment: Judgment normal.      Assessment & Plan:   Problem List Items Addressed This Visit       Other   Insomnia secondary to depression with anxiety    Chronic.  Improved with Trazodone.  Plan to continue Trazodone and collaboration with counseling.  PHQ-9 and GAD-7 are stable today.  No SI/HI.  Refills given.        Relevant Medications   traZODone (DESYREL) 50 MG tablet     Follow up plan: Return for with new PCP.

## 2021-05-12 ENCOUNTER — Ambulatory Visit (INDEPENDENT_AMBULATORY_CARE_PROVIDER_SITE_OTHER): Payer: Medicare HMO | Admitting: *Deleted

## 2021-05-12 DIAGNOSIS — E119 Type 2 diabetes mellitus without complications: Secondary | ICD-10-CM

## 2021-05-12 DIAGNOSIS — I10 Essential (primary) hypertension: Secondary | ICD-10-CM

## 2021-05-12 NOTE — Patient Instructions (Signed)
Visit Information  Thank you for taking time to visit with me today. Please don't hesitate to contact me if I can be of assistance to you before our next scheduled telephone appointment.  Following are the goals we discussed today:  Take all medications as prescribed Attend all scheduled provider appointments Perform all self care activities independently  Call provider office for new concerns or questions  Continue walking outdoors with your dog- keep up the good work Be mindful of carbohydrate intake- too much bread, pasta, rice, potatoes at one meal will elevate blood sugar Check on new blood pressure cuff from [facility] benefits catalog Check blood pressure at least 3 x per week and record in a log Follow low sodium diet- avoid fast food and salty snacks Continue your exercise program ( yoga and aerobics ) at [facility]- keep up the good work  [place] next appointment is by telephone on 07/21/21 at 9 am  Please call the care guide team at [phone] if you need to cancel or reschedule your appointment.   If you are experiencing a Mental Health or [place] or need someone to talk to, please call the Suicide and Crisis Lifeline: 988 call the [place] National Suicide Prevention Lifeline: (863)251-7637 or TTY: (631) 511-6362 TTY 2721278203) to talk to a trained counselor call 1-800-273-TALK (toll free, 24 hour hotline) go to [facility] [address], [place] [phone]) call 911   Patient verbalizes understanding of instructions and care plan provided today and agrees to view in [place]. Active MyChart status confirmed with patient.    Jacqlyn Larsen RNC, BSN RN Case Manager [facility] [phone]

## 2021-05-12 NOTE — Chronic Care Management (AMB) (Signed)
Chronic Care Management   CCM RN Visit Note  05/12/2021 Name: Nina Olson MRN: [id number] DOB: 10-13-1945  Subjective: Nina Olson is a 76 y.o. year old female who is a primary care patient of Valentino Nose, NP. The care management team was consulted for assistance with disease management and care coordination needs.    Engaged with patient by telephone for follow up visit in response to provider referral for case management and/or care coordination services.   Consent to Services:  The patient was given information about Chronic Care Management services, agreed to services, and gave verbal consent prior to initiation of services.  Please see initial visit note for detailed documentation.   Patient agreed to services and verbal consent obtained.   Assessment: Review of patient past medical history, allergies, medications, health status, including review of consultants reports, laboratory and other test data, was performed as part of comprehensive evaluation and provision of chronic care management services.   SDOH (Social Determinants of Health) assessments and interventions performed:    CCM Care Plan  No Known Allergies  Outpatient Encounter Medications as of 05/12/2021  Medication Sig   Accu-Chek Softclix Lancets lancets USE TO CHECK CBG ONCE A DAY   acyclovir (ZOVIRAX) 200 MG capsule Take 1 capsule (200 mg total) by mouth 2 (two) times daily.   atorvastatin (LIPITOR) 40 MG tablet Take 1 tablet (40 mg total) by mouth daily.   B Complex Vitamins (B COMPLEX PO) Take by mouth as directed.   blood glucose meter kit and supplies Dispense based on patient and insurance preference. Check CBG once a day (FOR ICD-10 E10.9, E11.9).   chlorhexidine (PERIDEX) 0.12 % solution    esomeprazole (NEXIUM) 40 MG capsule Take 1 capsule (40 mg total) by mouth daily.   glucose blood (ACCU-CHEK GUIDE) test strip USE TO CHECK CBG ONCE A DAY   hydrochlorothiazide (HYDRODIURIL) 25 MG tablet Take  0.5 tablets (12.5 mg total) by mouth daily. Pt reports she takes half of 25 mg tablet   ibuprofen (ADVIL) 800 MG tablet Take by mouth.   losartan (COZAAR) 100 MG tablet Take 1 tablet (100 mg total) by mouth daily.   Multiple Vitamin (MULTI-VITAMIN) tablet Take by mouth.   Omega-3 Fatty Acids (FISH OIL PO) Take by mouth as directed.   Probiotic Product (PROBIOTIC DAILY PO) Take by mouth.   traZODone (DESYREL) 50 MG tablet Take 0.5-1 tablets (25-50 mg total) by mouth at bedtime as needed for sleep.   vitamin C (ASCORBIC ACID) 500 MG tablet Take 1 tablet (500 mg total) by mouth daily.   ZINC OXIDE PO Take by mouth.   No facility-administered encounter medications on file as of 05/12/2021.    Patient Active Problem List   Diagnosis Date Noted   Abdominal bloating 07/15/2020   Abnormal feces 07/15/2020   Colon cancer screening 07/15/2020   Family history of malignant neoplasm of gastrointestinal tract 07/15/2020   Generalized abdominal pain 07/15/2020   Personal history of colonic polyps 07/15/2020   Rectal bleeding 07/15/2020   HSV (herpes simplex virus) anogenital infection 07/15/2020   Rectal itching 07/15/2020   Bladder prolapse, female, acquired 04/28/2019   Controlled type 2 diabetes mellitus with neuropathy (HCC) 10/27/2018   OA (osteoarthritis) of hip 05/24/2017   DDD (degenerative disc disease), lumbar 05/24/2017   GERD (gastroesophageal reflux disease) 02/10/2016   Essential hypertension 07/26/2015   Hyperlipidemia 07/26/2015   Insomnia secondary to depression with anxiety 07/26/2015   Osteopenia    Palpitations  Colon polyps     Conditions to be addressed/monitored:HTN and DMII  Care Plan : RN Care Manager plan of care  Updates made by Kassie Mends, RN since 05/12/2021 12:00 AM     Problem: No plan of care established for management of chronic disease states (HTN, DM2)   Priority: High     Long-Range Goal: Development of plan of care for chronic disease  management (HTN, DM2)   Start Date: 03/06/2021  Expected End Date: 11/08/2021  Priority: High  Note:   Current Barriers:  Knowledge Deficits related to plan of care for management of HTN and DMII  Knowledge Deficits related to basic Diabetes pathophysiology and self care/management- pt needs reinforcement for ADA/ carbohydrate modified diet, pt does not always eat enough vegetables, drinks sugary drinks at times and reports she would like to do better, pt gets outside and walks her dog daily, is now going to [facility] [employer] for aerobics and yoga. Pt has been checking CBG approximately 2 x per week with readings all in 90's to low 100's range (110, 96).  Reports most recent blood pressure reading is 136/70 and checks on occasion. Pt reports she lives with spouse, is independent with all aspects of her care, has episodes of depression at times, is working with counselor and feels this is helping. Pt is working with LCSW for depression management, pt reports she did receive advanced directives packet but has not completed. Patient reports she does have acyclovir and is taking daily as prescribed (obtained with [facility] card and was able to afford)  Patient reports her primary care provider will be leaving the practice in the near future and she was given a list of names for new doctor and is working on who she wants to select. Does not adhere to provider recommendations re:  carbohydrate modified diet. Knowledge Deficits related to basic understanding of hypertension pathophysiology and self care management- Reports most recent blood pressure reading is 136/70 and checks on occasion, reports her cuff is old and she may get new cuff from [facility] benefit catalog.  RNCM Clinical Goal(s):  Patient will verbalize understanding of plan for management of HTN and DMII as evidenced by patient report, review of EHR and  through collaboration with RN Care manager, provider, and care team.   Interventions: 1:1  collaboration with primary care provider regarding development and update of comprehensive plan of care as evidenced by provider attestation and co-signature Inter-disciplinary care team collaboration (see longitudinal plan of care) Evaluation of current treatment plan related to  self management and patient's adherence to plan as established by provider   Diabetes Interventions:  (Status:  Goal on track:  Yes.) Long Term Goal Assessed patient's understanding of A1c goal: <7% Reviewed medications with patient and discussed importance of medication adherence Review of patient status, including review of consultants reports, relevant laboratory and other test results, and medications completed Reviewed carbohydrate modified diet, portion control Reviewed CBG log with patient Reviewed importance of taking acyclovir as prescribed Pain assessment completed Continue working with counselor  for depression management Lab Results  Component Value Date   HGBA1C 6.9 (H) 11/07/2020   Hypertension Interventions:  (Status:  Goal on track:  Yes.) Long Term Goal Last practice recorded BP readings:  BP Readings from Last 3 Encounters:  12/19/20 118/68  11/07/20 132/66  08/15/20 140/84  Most recent eGFR/CrCl:  Lab Results  Component Value Date   EGFR 62 12/19/2020    No components found for: CRCL  Advised  patient, providing education and rationale, to monitor blood pressure daily and record, calling PCP for findings outside established parameters Reinforced low sodium diet and importance of reading food labels, avoiding salty snacks and fast food Encouraged patient with continuing her exercising outdoors/ walking, aerobics and yoga Encouraged patient to complete advanced directives Reviewed importance of taking medications as prescribed  Patient Goals/Self-Care Activities: Take all medications as prescribed Attend all scheduled provider appointments Perform all self care activities independently   Call provider office for new concerns or questions  Continue walking outdoors with your dog- keep up the good work Be mindful of carbohydrate intake- too much bread, pasta, rice, potatoes at one meal will elevate blood sugar Check on new blood pressure cuff from [facility] benefits catalog Check blood pressure at least 3 x per week and record in a log Follow low sodium diet- avoid fast food and salty snacks Continue your exercise program ( yoga and aerobics ) at [facility]- keep up the good work  Follow Up Plan:  Telephone follow up appointment with care management team member scheduled for:  07/21/2021      Plan:Telephone follow up appointment with care management team member scheduled for:  07/21/21  Jacqlyn Larsen [facility], BSN RN Case Manager [facility] [phone]

## 2021-05-16 ENCOUNTER — Ambulatory Visit: Payer: Medicare PPO | Admitting: *Deleted

## 2021-05-16 DIAGNOSIS — F418 Other specified anxiety disorders: Secondary | ICD-10-CM

## 2021-05-16 DIAGNOSIS — E119 Type 2 diabetes mellitus without complications: Secondary | ICD-10-CM

## 2021-05-16 NOTE — Patient Instructions (Signed)
Visit Information  Thank you for taking time to visit with me today. Please don't hesitate to contact me if I can be of assistance to you before our next scheduled telephone appointment.  Following are the goals we discussed today:  (Copy and paste patient goals from clinical care plan here)  Our next appointment is by telephone on 06/27/21 at 11am  Please call the care guide team at [phone] if you need to cancel or reschedule your appointment.   If you are experiencing a Mental Health or [place] or need someone to talk to, please call the Suicide and Crisis Lifeline: 988 call the [place] National Suicide Prevention Lifeline: 905-195-1563 or TTY: (704) 247-6781 TTY 480 544 5198) to talk to a trained counselor call 1-800-273-TALK (toll free, 24 hour hotline) call the [facility]: [phone] call 911   The patient verbalized understanding of instructions, educational materials, and care plan provided today and declined offer to receive copy of patient instructions, educational materials, and care plan.   Eduard Clos MSW, LCSW Licensed Clinical Social Worker [place]    [phone]

## 2021-05-16 NOTE — Chronic Care Management (AMB) (Signed)
Chronic Care Management    Clinical Social Work Note  05/16/2021 Name: Nina Olson MRN: [id number] DOB: Aug 29, 1945  Nina Olson is a 76 y.o. year old female who is a primary care patient of Eulogio Bear, NP. The CCM team was consulted to assist the patient with chronic disease management and/or care coordination needs related to: Mental Health Counseling and Resources.   Engaged with patient by telephone for follow up visit in response to provider referral for social work chronic care management and care coordination services.   Consent to Services:  The patient was given information about Chronic Care Management services, agreed to services, and gave verbal consent prior to initiation of services.  Please see initial visit note for detailed documentation.   Patient agreed to services and consent obtained.   Assessment: Review of patient past medical history, allergies, medications, and health status, including review of relevant consultants reports was performed today as part of a comprehensive evaluation and provision of chronic care management and care coordination services.     SDOH (Social Determinants of Health) assessments and interventions performed:    Advanced Directives Status: Not addressed in this encounter.  CCM Care Plan  No Known Allergies  Outpatient Encounter Medications as of 05/16/2021  Medication Sig   Accu-Chek Softclix Lancets lancets USE TO CHECK CBG ONCE A DAY   acyclovir (ZOVIRAX) 200 MG capsule Take 1 capsule (200 mg total) by mouth 2 (two) times daily.   atorvastatin (LIPITOR) 40 MG tablet Take 1 tablet (40 mg total) by mouth daily.   B Complex Vitamins (B COMPLEX PO) Take by mouth as directed.   blood glucose meter kit and supplies Dispense based on patient and insurance preference. Check CBG once a day (FOR ICD-10 E10.9, E11.9).   chlorhexidine (PERIDEX) 0.12 % solution    esomeprazole (NEXIUM) 40 MG capsule Take 1 capsule (40 mg total) by mouth  daily.   glucose blood (ACCU-CHEK GUIDE) test strip USE TO CHECK CBG ONCE A DAY   hydrochlorothiazide (HYDRODIURIL) 25 MG tablet Take 0.5 tablets (12.5 mg total) by mouth daily. Pt reports she takes half of 25 mg tablet   ibuprofen (ADVIL) 800 MG tablet Take by mouth.   losartan (COZAAR) 100 MG tablet Take 1 tablet (100 mg total) by mouth daily.   Multiple Vitamin (MULTI-VITAMIN) tablet Take by mouth.   Omega-3 Fatty Acids (FISH OIL PO) Take by mouth as directed.   Probiotic Product (PROBIOTIC DAILY PO) Take by mouth.   traZODone (DESYREL) 50 MG tablet Take 0.5-1 tablets (25-50 mg total) by mouth at bedtime as needed for sleep.   vitamin C (ASCORBIC ACID) 500 MG tablet Take 1 tablet (500 mg total) by mouth daily.   ZINC OXIDE PO Take by mouth.   No facility-administered encounter medications on file as of 05/16/2021.    Patient Active Problem List   Diagnosis Date Noted   Abdominal bloating 07/15/2020   Abnormal feces 07/15/2020   Colon cancer screening 07/15/2020   Family history of malignant neoplasm of gastrointestinal tract 07/15/2020   Generalized abdominal pain 07/15/2020   Personal history of colonic polyps 07/15/2020   Rectal bleeding 07/15/2020   HSV (herpes simplex virus) anogenital infection 07/15/2020   Rectal itching 07/15/2020   Bladder prolapse, female, acquired 04/28/2019   Controlled type 2 diabetes mellitus with neuropathy ([place]) 10/27/2018   OA (osteoarthritis) of hip 05/24/2017   DDD (degenerative disc disease), lumbar 05/24/2017   GERD (gastroesophageal reflux disease) 02/10/2016   Essential  hypertension 07/26/2015   Hyperlipidemia 07/26/2015   Insomnia secondary to depression with anxiety 07/26/2015   Osteopenia    Palpitations    Colon polyps     Conditions to be addressed/monitored: Depression; Mental Health Concerns   Care Plan : LCSW Plan of Care  Updates made by Deirdre Peer, LCSW since 05/16/2021 12:00 AM     Problem: Symptoms (Depression)       Long-Range Goal: Symptoms Monitored and Managed   Start Date: 12/21/2020  Expected End Date: 07/06/2021  This Visit's Progress: On track  Recent Progress: On track  Priority: High  Note:   Current Barriers:  Chronic Mental Health needs related to depression /sadness Mental Health Concerns  Suicidal Ideation/Homicidal Ideation: No  Clinical Social Work Goal(s):  patient will work with SW  PRN  by telephone or in person to reduce or manage symptoms related to depression demonstrate a reduction in symptoms related to :Depression, Grief, and Stress at family concerns  explore community resource options for unmet needs related KW:IOXBDZH counseling   Interventions: CSW spoke with pt who continues with her virtual counseling appointments. Pt reports this is going well- she feels it is helpful. CSW completed PHQ9 Depression Screening with pt today- pt scoring less/better than prior-  Depression screen [facility] 2/9 05/16/2021 05/01/2021 03/20/2021 01/09/2021 11/28/2020  Decreased Interest _0 Down, Depressed, Hopeless _1 PHQ - 2 Score _2 Altered sleeping _3 Tired, decreased energy _4 Change in appetite 1 1 0 1 0  Feeling bad or failure about yourself  _5 0  Trouble concentrating _6 Moving slowly or fidgety/restless 0 0 1 1 0  Suicidal thoughts 0 0 0 0 0  PHQ-9 Score _7 Difficult doing work/chores - Somewhat difficult - Somewhat difficult Somewhat difficult  Some recent data might be hidden          Also reminded pt to use the [facility] OTC catalog benefit before the end of this quarter/year.   Patient interviewed and appropriate assessments performed: brief mental health assessment Recent PHQ9 score of "9" 1:1 collaboration with Eulogio Bear, NP regarding development and update of comprehensive plan of care as evidenced by provider attestation and co-signature Patient interviewed and appropriate assessments performed Provided  mental health counseling with regard to depression and allowing herself to not hold guilt/blame for other's issues Provided patient with information about depression, treatment options, etc Referred patient to [facility] for long term follow up and therapy/counseling Depression screen reviewed , Active listening / Reflection utilized , Emotional Supportive Provided, Provided brief CBT , Quality of sleep assessed & Sleep Hygiene techniques promoted , Participation in counseling encouraged , and Discussed referral to [place] to assist with connecting to mental health provider  Patient Self Care Activities:  Self administers medications as prescribed Attends all scheduled provider appointments Attends church or other social activities Calls provider office for new concerns or questions Ability for insight Motivation for treatment  Patient Coping Strengths:  Supportive [place] to Communicate Effectively  Patient Self Care Deficits:  Holding on to guilt/blaming self for others  Patient Goals:  -attend all counseling appointments -consider attending exercise programs again - call to cancel if needed - keep a calendar with appointment dates  Follow Up Plan: Appointment scheduled for SW follow  up with client by phone on:  06/27/21            Eduard Clos MSW, [place] Licensed Clinical Social Worker [place]    [phone]

## 2021-05-17 DIAGNOSIS — F331 Major depressive disorder, recurrent, moderate: Secondary | ICD-10-CM | POA: Diagnosis not present

## 2021-06-02 ENCOUNTER — Other Ambulatory Visit: Payer: Self-pay | Admitting: Nurse Practitioner

## 2021-06-02 DIAGNOSIS — K219 Gastro-esophageal reflux disease without esophagitis: Secondary | ICD-10-CM

## 2021-06-05 ENCOUNTER — Telehealth: Payer: Self-pay

## 2021-06-05 DIAGNOSIS — I10 Essential (primary) hypertension: Secondary | ICD-10-CM

## 2021-06-05 MED ORDER — LOSARTAN POTASSIUM 100 MG PO TABS: 100.0000 mg | ORAL_TABLET | Freq: Every day | ORAL | 1 refills | Status: DC

## 2021-06-05 NOTE — Telephone Encounter (Signed)
Refill sent to pharmacy. Nothing further needed at this time.

## 2021-06-05 NOTE — Telephone Encounter (Signed)
Pharmacy faxed a refill request for  losartan (COZAAR) 100 MG tablet [[id number]]    Order Details Dose: 100 mg Route: Oral Frequency: Daily  Dispense Quantity: 90 tablet Refills: 0        Sig: Take 1 tablet (100 mg total) by mouth daily.

## 2021-06-06 DIAGNOSIS — E119 Type 2 diabetes mellitus without complications: Secondary | ICD-10-CM

## 2021-06-06 DIAGNOSIS — I10 Essential (primary) hypertension: Secondary | ICD-10-CM | POA: Diagnosis not present

## 2021-06-27 ENCOUNTER — Ambulatory Visit (INDEPENDENT_AMBULATORY_CARE_PROVIDER_SITE_OTHER): Payer: Medicare HMO | Admitting: *Deleted

## 2021-06-27 ENCOUNTER — Telehealth: Payer: Self-pay | Admitting: Nurse Practitioner

## 2021-06-27 DIAGNOSIS — F418 Other specified anxiety disorders: Secondary | ICD-10-CM

## 2021-06-27 DIAGNOSIS — E119 Type 2 diabetes mellitus without complications: Secondary | ICD-10-CM

## 2021-06-27 DIAGNOSIS — I10 Essential (primary) hypertension: Secondary | ICD-10-CM

## 2021-06-27 NOTE — Patient Instructions (Signed)
Visit Information ? ?Thank you for taking time to visit with me today. Please don't hesitate to contact me if I can be of assistance to you before our next scheduled telephone appointment. ? ?Our next appointment is by telephone on 07/25/21  ? ?Please call the care guide team at [phone] if you need to cancel or reschedule your appointment.  ? ?If you are experiencing a Mental Health or [place] or need someone to talk to, please call the Suicide and Crisis Lifeline: 988 ?call the [place] National Suicide Prevention Lifeline: 772-716-1447 or TTY: 337-662-9622 TTY 804-365-8155) to talk to a trained counselor ?go to [facility] [address], [place] [phone]) ?call 911  ? ?Patient verbalizes understanding of instructions and care plan provided today and agrees to view in [place]. Active MyChart status confirmed with patient.   ? ?Eduard Clos MSW, LCSW ?Licensed Clinical Social Worker ?[place]    ?[phone]  ?

## 2021-06-27 NOTE — Chronic Care Management (AMB) (Cosign Needed)
?Chronic Care Management  ? ? Clinical Social Work Note ? ?06/27/2021 ?Name: Nina Olson MRN: [id number] DOB: October 31, 1945 ? ?Nina Olson is a 76 y.o. year old female who is a primary care patient of Eulogio Bear, NP. The CCM team was consulted to assist the patient with chronic disease management and/or care coordination needs related to: [employer]  and [employer].  ? ?Engaged with patient by telephone for follow up visit in response to provider referral for social work chronic care management and care coordination services.  ? ?Consent to Services:  ?The patient was given information about Chronic Care Management services, agreed to services, and gave verbal consent prior to initiation of services.  Please see initial visit note for detailed documentation.  ? ?Patient agreed to services and consent obtained.  ? ?Assessment: Review of patient past medical history, allergies, medications, and health status, including review of relevant consultants reports was performed today as part of a comprehensive evaluation and provision of chronic care management and care coordination services.    ? ?SDOH (Social Determinants of Health) assessments and interventions performed:   ? ?Advanced Directives Status: Not addressed in this encounter. ? ?CCM Care Plan ? ?No Known Allergies ? ?Outpatient Encounter Medications as of 06/27/2021  ?Medication Sig  ? Accu-Chek Softclix Lancets lancets USE TO CHECK CBG ONCE A DAY  ? acyclovir (ZOVIRAX) 200 MG capsule Take 1 capsule (200 mg total) by mouth 2 (two) times daily.  ? atorvastatin (LIPITOR) 40 MG tablet Take 1 tablet (40 mg total) by mouth daily.  ? B Complex Vitamins (B COMPLEX PO) Take by mouth as directed.  ? blood glucose meter kit and supplies Dispense based on patient and insurance preference. Check CBG once a day (FOR ICD-10 E10.9, E11.9).  ? chlorhexidine (PERIDEX) 0.12 % solution   ? esomeprazole (NEXIUM) 40 MG capsule Take 1  capsule by mouth once daily  ? glucose blood (ACCU-CHEK GUIDE) test strip USE TO CHECK CBG ONCE A DAY  ? hydrochlorothiazide (HYDRODIURIL) 25 MG tablet Take 0.5 tablets (12.5 mg total) by mouth daily. Pt reports she takes half of 25 mg tablet  ? ibuprofen (ADVIL) 800 MG tablet Take by mouth.  ? losartan (COZAAR) 100 MG tablet Take 1 tablet (100 mg total) by mouth daily.  ? Multiple Vitamin (MULTI-VITAMIN) tablet Take by mouth.  ? Omega-3 Fatty Acids (FISH OIL PO) Take by mouth as directed.  ? Probiotic Product (PROBIOTIC DAILY PO) Take by mouth.  ? traZODone (DESYREL) 50 MG tablet Take 0.5-1 tablets (25-50 mg total) by mouth at bedtime as needed for sleep.  ? vitamin C (ASCORBIC ACID) 500 MG tablet Take 1 tablet (500 mg total) by mouth daily.  ? ZINC OXIDE PO Take by mouth.  ? ?No facility-administered encounter medications on file as of 06/27/2021.  ? ? ?Patient Active Problem List  ? Diagnosis Date Noted  ? Abdominal bloating 07/15/2020  ? Abnormal feces 07/15/2020  ? Colon cancer screening 07/15/2020  ? Family history of malignant neoplasm of gastrointestinal tract 07/15/2020  ? Generalized abdominal pain 07/15/2020  ? Personal history of colonic polyps 07/15/2020  ? Rectal bleeding 07/15/2020  ? HSV (herpes simplex virus) anogenital infection 07/15/2020  ? Rectal itching 07/15/2020  ? Bladder prolapse, female, acquired 04/28/2019  ? Controlled type 2 diabetes mellitus with neuropathy ([place]) 10/27/2018  ? OA (osteoarthritis) of hip 05/24/2017  ? DDD (degenerative disc disease), lumbar 05/24/2017  ? GERD (gastroesophageal reflux disease) 02/10/2016  ?  Essential hypertension 07/26/2015  ? Hyperlipidemia 07/26/2015  ? Insomnia secondary to depression with anxiety 07/26/2015  ? Osteopenia   ? Palpitations   ? Colon polyps   ? ? ?Conditions to be addressed/monitored: Depression; Mental Health Concerns  ? ?Care Plan : LCSW Plan of Care  ?Updates made by Deirdre Peer, LCSW since 06/27/2021 12:00 AM  ?  ? ?Problem:  Symptoms (Depression)   ?  ? ?Long-Range Goal: Symptoms Monitored and Managed   ?Start Date: 12/21/2020  ?Expected End Date: 07/06/2021  ?This Visit's Progress: On track  ?Recent Progress: On track  ?Priority: High  ?Note:   ?Current Barriers:  ?Chronic Mental Health needs related to depression /sadness ?Mental Health Concerns  ?Suicidal Ideation/Homicidal Ideation: No ? ?Clinical Social Work Goal(s):  ?patient will work with SW  PRN  by telephone or in person to reduce or manage symptoms related to depression ?demonstrate a reduction in symptoms related to :Depression, Grief, and Stress at family concerns  ?explore community resource options for unmet needs related [id number] counseling  ? ?Interventions: ? ?06/27/21-  CSW spoke with pt today who is complaining of "reflux, bubbling in my stomach".  CSW offered to make RNCM aware and also suggested she reach out to PCP office- she reports she is still trying to get connected with a new provider but plans to call [facility] and see if they are able to keep her and reassign her to a new PCP. Also, she plans to make them aware of these current issues.  ?Pt continues with her counseling support as well as going to the gym 3 times weekly for yoga, aerobics, etc.  ?CSW will plan to reassess depression and PHQ9 at next visit. ? ?CSW spoke with pt who continues with her virtual counseling appointments. Pt reports this is going well- she feels it is helpful. CSW completed PHQ9 Depression Screening with pt today- pt scoring less/better than prior-  ?Depression screen [facility] 2/9 05/16/2021 05/01/2021 03/20/2021 01/09/2021 11/28/2020  ?Decreased Interest 1 2 1 3 3   ?Down, Depressed, Hopeless 1 1 1 1 2   ?PHQ - 2 Score 2 3 2 4 5   ?Altered sleeping 1 1 2 2 2   ?Tired, decreased energy 2 2 1 1 1   ?Change in appetite 1 1 0 1 0  ?Feeling bad or failure about yourself  2 3 1 2  0  ?Trouble concentrating 1 1 3 1 1   ?Moving slowly or fidgety/restless 0 0 1 1 0  ?Suicidal thoughts 0 0 0 0 0  ?PHQ-9 Score 9  11 10 12 9   ?Difficult doing work/chores - Somewhat difficult - Somewhat difficult Somewhat difficult  ?Some recent data might be hidden  ?        Also reminded pt to use the [facility] OTC catalog benefit before the end of this quarter/year.  ? ?Patient interviewed and appropriate assessments performed: brief mental health assessment ?Recent PHQ9 score of "9" ?1:1 collaboration with Eulogio Bear, NP regarding development and update of comprehensive plan of care as evidenced by provider attestation and co-signature ?Patient interviewed and appropriate assessments performed ?Provided mental health counseling with regard to depression and allowing herself to not hold guilt/blame for other's issues ?Provided patient with information about depression, treatment options, etc ?Referred patient to [place] for long term follow up and therapy/counseling ?Depression screen reviewed , Active listening / Reflection utilized , Emotional Supportive Provided, Provided brief CBT , Quality of sleep assessed & Sleep Hygiene techniques promoted , Participation in counseling encouraged ,  and Discussed referral to [facility] to assist with connecting to mental health provider ? ?Patient Self Care Activities:  ?Self administers medications as prescribed ?Attends all scheduled provider appointments ?Attends church or other social activities ?Calls provider office for new concerns or questions ?Ability for insight ?Motivation for treatment ? ?Patient Coping Strengths:  ?Supportive Relationships ?Family ?Friends ?Church ?Spirituality ?Hopefulness ?Able to Communicate Effectively ? ?Patient Self Care Deficits:  ?Holding on to guilt/blaming self for others ? ?Patient Goals:  ?-attend all counseling appointments ?-consider attending exercise programs again ?- call to cancel if needed ?- keep a calendar with appointment dates ? ?Follow Up Plan: Appointment scheduled for SW follow up with client by phone on:  07/25/21 ?  ?  ? ?Follow Up Plan:  Appointment scheduled for SW follow up with client by phone on: 07/25/21 ?     ?Eduard Clos MSW, LCSW ?Licensed Clinical Social Worker ? [place]    ?[phone]  ? ?

## 2021-06-27 NOTE — Telephone Encounter (Signed)
Patient returned call she missed just a few minutes ago; unsure of who was trying to reach her.  ?

## 2021-06-29 DIAGNOSIS — F331 Major depressive disorder, recurrent, moderate: Secondary | ICD-10-CM | POA: Diagnosis not present

## 2021-07-10 ENCOUNTER — Telehealth: Payer: Self-pay | Admitting: Nurse Practitioner

## 2021-07-10 DIAGNOSIS — E782 Mixed hyperlipidemia: Secondary | ICD-10-CM

## 2021-07-10 MED ORDER — ATORVASTATIN CALCIUM 40 MG PO TABS: 40.0000 mg | ORAL_TABLET | Freq: Every day | ORAL | 1 refills | Status: DC

## 2021-07-10 NOTE — Telephone Encounter (Signed)
Received eFax from pharmacy to request refill of atorvastatin (LIPITOR) '20MG'$  . ? ?Pharmacy eFax received from; ? ?[facility] ([place]), [place] - [address]  ?[address], [place] [address]  ?Phone:  [phone]  Fax:  [phone]  ? ?Please advise pharmacist.  ?

## 2021-07-10 NOTE — Telephone Encounter (Signed)
Received eFax from pharmacy to request refill of ? ?atorvastatin (LIPITOR) 40 MG tablet ? ? ?Pharmacy eFax received from: ? ?[facility] ([place]), [place] - [address]  ?[address], [place] [address]  ?Phone:  [phone]  Fax:  [phone]  ? ?Please advise pharmacist.  ?

## 2021-07-13 ENCOUNTER — Ambulatory Visit (INDEPENDENT_AMBULATORY_CARE_PROVIDER_SITE_OTHER): Payer: Medicare HMO | Admitting: Family Medicine

## 2021-07-13 VITALS — BP 124/84 | HR 93 | Temp 96.0°F | Ht 65.0 in | Wt 179.8 lb

## 2021-07-13 DIAGNOSIS — E1140 Type 2 diabetes mellitus with diabetic neuropathy, unspecified: Secondary | ICD-10-CM | POA: Diagnosis not present

## 2021-07-13 DIAGNOSIS — I10 Essential (primary) hypertension: Secondary | ICD-10-CM | POA: Diagnosis not present

## 2021-07-13 DIAGNOSIS — K219 Gastro-esophageal reflux disease without esophagitis: Secondary | ICD-10-CM

## 2021-07-13 DIAGNOSIS — R1013 Epigastric pain: Secondary | ICD-10-CM

## 2021-07-13 MED ORDER — FAMOTIDINE 40 MG PO TABS: 40.0000 mg | ORAL_TABLET | Freq: Every day | ORAL | 0 refills | Status: DC

## 2021-07-13 NOTE — Progress Notes (Signed)
? ?Subjective:  ? ? Patient ID: Nina Olson, female    DOB: 02-Nov-1945, 76 y.o.   MRN: [id number] ? ?HPI ? ?Patient presents today with abdominal discomfort.  She states that she has been dealing with this for several months.  She is on Nexium.  She reports dyspepsia.  She reports an unsettled feeling in the center of her stomach typically worse when she lays down at night.  She reports increased bowel sounds and a dull aching pain.  Her last colonoscopy was negative in 2020.  She has never had an EGD.  She denies any weight loss.  In fact she is gaining weight.  She denies any melena or hematochezia or diarrhea or constipation.  She denies any fevers or chills.  She tried elevating the head of her bed with minimal improvement.  She does report reflux and heartburn particularly at night. ?Past Medical History:  ?Diagnosis Date  ? Colon polyps   ? GERD (gastroesophageal reflux disease)   ? Osteopenia   ? Palpitations   ? ?Past Surgical History:  ?Procedure Laterality Date  ? CESAREAN SECTION    ? ?Current Outpatient Medications on File Prior to Visit  ?Medication Sig Dispense Refill  ? Accu-Chek Softclix Lancets lancets USE TO CHECK CBG ONCE A DAY 100 each 0  ? atorvastatin (LIPITOR) 40 MG tablet Take 1 tablet (40 mg total) by mouth daily. 90 tablet 1  ? B Complex Vitamins (B COMPLEX PO) Take by mouth as directed.    ? blood glucose meter kit and supplies Dispense based on patient and insurance preference. Check CBG once a day (FOR ICD-10 E10.9, E11.9). 1 each 0  ? esomeprazole (NEXIUM) 40 MG capsule Take 1 capsule by mouth once daily 90 capsule 1  ? glucose blood (ACCU-CHEK GUIDE) test strip USE TO CHECK CBG ONCE A DAY 100 each 6  ? hydrochlorothiazide (HYDRODIURIL) 25 MG tablet Take 0.5 tablets (12.5 mg total) by mouth daily. Pt reports she takes half of 25 mg tablet 90 tablet 3  ? ibuprofen (ADVIL) 800 MG tablet Take by mouth.    ? losartan (COZAAR) 100 MG tablet Take 1 tablet (100 mg total) by mouth daily. 90  tablet 1  ? Multiple Vitamin (MULTI-VITAMIN) tablet Take by mouth.    ? Omega-3 Fatty Acids (FISH OIL PO) Take by mouth as directed.    ? Probiotic Product (PROBIOTIC DAILY PO) Take by mouth.    ? traZODone (DESYREL) 50 MG tablet Take 0.5-1 tablets (25-50 mg total) by mouth at bedtime as needed for sleep. 90 tablet 1  ? vitamin C (ASCORBIC ACID) 500 MG tablet Take 1 tablet (500 mg total) by mouth daily.    ? ZINC OXIDE PO Take by mouth.    ? ?No current facility-administered medications on file prior to visit.  ? ?No Known Allergies ?Social History  ? ?Socioeconomic History  ? Marital status: Married  ?  Spouse name: Not on file  ? Number of children: Not on file  ? Years of education: Not on file  ? Highest education level: Not on file  ?Occupational History  ? Not on file  ?Tobacco Use  ? Smoking status: Former  ? Smokeless tobacco: Former  ?  Quit date: 04/09/1978  ?Substance and Sexual Activity  ? Alcohol use: No  ? Drug use: No  ? Sexual activity: Never  ?Other Topics Concern  ? Not on file  ?Social History Narrative  ? Not on file  ? ?Social Determinants  of Health  ? ?Financial Resource Strain: Not on file  ?Food Insecurity: No Food Insecurity  ? Worried About Charity fundraiser in the Last Year: Never true  ? Ran Out of Food in the Last Year: Never true  ?Transportation Needs: No Transportation Needs  ? Lack of Transportation (Medical): No  ? Lack of Transportation (Non-Medical): No  ?Physical Activity: Not on file  ?Stress: Not on file  ?Social Connections: Not on file  ?Intimate Partner Violence: Not on file  ? ? ? ?Review of Systems  ?All other systems reviewed and are negative. ? ?   ?Objective:  ? Physical Exam ?Constitutional:   ?   Appearance: Normal appearance. She is normal weight.  ?HENT:  ?   Mouth/Throat:  ?   Mouth: Mucous membranes are moist.  ?   Pharynx: No oropharyngeal exudate or posterior oropharyngeal erythema.  ?Eyes:  ?   General: No scleral icterus. ?   Conjunctiva/sclera: Conjunctivae  normal.  ?   Pupils: Pupils are equal, round, and reactive to light.  ?Cardiovascular:  ?   Rate and Rhythm: Normal rate and regular rhythm.  ?   Pulses: Normal pulses.  ?   Heart sounds: Normal heart sounds. No murmur heard. ?  No friction rub. No gallop.  ?Pulmonary:  ?   Effort: Pulmonary effort is normal. No respiratory distress.  ?   Breath sounds: Normal breath sounds. No stridor.  ?Abdominal:  ?   General: Bowel sounds are normal. There is no distension.  ?   Palpations: Abdomen is soft. There is no mass.  ?   Tenderness: There is no abdominal tenderness. There is no guarding or rebound.  ?Musculoskeletal:  ?   Right lower leg: No edema.  ?   Left lower leg: No edema.  ?Neurological:  ?   Mental Status: She is alert.  ? ? ? ? ? ?   ?Assessment & Plan:  ?Gastroesophageal reflux disease without esophagitis ? ?Benign essential HTN ? ?Controlled type 2 diabetes mellitus with neuropathy ([place]) ? ?Dyspepsia ?Patient can return at her convenience fasting check CBC CMP lipid panel and A1c related to her diabetes.  Her blood pressure today is well controlled.  I believe her dyspepsia could be related to her underlying GERD.  I will add Pepcid 40 mg a day to her Nexium I recommended the patient follow-up with me in 2 to 4 weeks.  If not improving at that point, I would recommend GI consultation for EGD versus considering imaging of the abdomen and pelvis ? ?

## 2021-07-17 DIAGNOSIS — I87393 Chronic venous hypertension (idiopathic) with other complications of bilateral lower extremity: Secondary | ICD-10-CM | POA: Diagnosis not present

## 2021-07-17 DIAGNOSIS — I872 Venous insufficiency (chronic) (peripheral): Secondary | ICD-10-CM | POA: Diagnosis not present

## 2021-07-21 ENCOUNTER — Ambulatory Visit (INDEPENDENT_AMBULATORY_CARE_PROVIDER_SITE_OTHER): Payer: Medicare HMO | Admitting: *Deleted

## 2021-07-21 DIAGNOSIS — I10 Essential (primary) hypertension: Secondary | ICD-10-CM

## 2021-07-21 DIAGNOSIS — E119 Type 2 diabetes mellitus without complications: Secondary | ICD-10-CM

## 2021-07-21 NOTE — Patient Instructions (Signed)
Visit Information ? ?Thank you for taking time to visit with me today. Please don't hesitate to contact me if I can be of assistance to you before our next scheduled telephone appointment. ? ?Following are the goals we discussed today:  ?Take all medications as prescribed ?Attend all scheduled provider appointments ?Perform all self care activities independently  ?Call provider office for new concerns or questions  ?Continue walking outdoors with your dog- keep up the good work ?Be mindful of carbohydrate intake- too much bread, pasta, rice, potatoes at one meal will elevate blood sugar ?Avoid/ limit spicy, greasy foods in your diet ?Check blood pressure at least 3 x per week and record in a log ?Follow low sodium diet- avoid fast food and salty snacks, read labels ?Continue your exercise program ( yoga and aerobics ) at [facility]- keep up the good work ? ?Our next appointment is by telephone on 10/13/21 at 9 am ? ?Please call the care guide team at [phone] if you need to cancel or reschedule your appointment.  ? ?If you are experiencing a Mental Health or [place] or need someone to talk to, please call the Suicide and Crisis Lifeline: 988 ?call the [place] National Suicide Prevention Lifeline: (223)446-4627 or TTY: (940)838-4348 TTY (567) 568-5930) to talk to a trained counselor ?call 1-800-273-TALK (toll free, 24 hour hotline) ?go to [facility] [address], [place] [phone]) ?call 911  ? ?Patient verbalizes understanding of instructions and care plan provided today and agrees to view in [place]. Active MyChart status confirmed with patient.   ? ?Jacqlyn Larsen RNC, BSN ?RN Case Manager ?[place] ?[phone] ? ?

## 2021-07-21 NOTE — Chronic Care Management (AMB) (Signed)
?Chronic Care Management  ? ?CCM RN Visit Note ? ?07/21/2021 ?Name: NYIMAH SHADDUCK MRN: [id number] DOB: 10/15/1945 ? ?Subjective: ?LINNEA TODISCO is a 76 y.o. year old female who is a primary care patient of Pickard, Cammie Mcgee, MD. The care management team was consulted for assistance with disease management and care coordination needs.   ? ?Engaged with patient by telephone for follow up visit in response to provider referral for case management and/or care coordination services.  ? ?Consent to Services:  ?The patient was given information about Chronic Care Management services, agreed to services, and gave verbal consent prior to initiation of services.  Please see initial visit note for detailed documentation.  ? ?Patient agreed to services and verbal consent obtained.  ? ?Assessment: Review of patient past medical history, allergies, medications, health status, including review of consultants reports, laboratory and other test data, was performed as part of comprehensive evaluation and provision of chronic care management services.  ? ?SDOH (Social Determinants of Health) assessments and interventions performed:   ? ?CCM Care Plan ? ?No Known Allergies ? ?Outpatient Encounter Medications as of 07/21/2021  ?Medication Sig  ? Accu-Chek Softclix Lancets lancets USE TO CHECK CBG ONCE A DAY  ? atorvastatin (LIPITOR) 40 MG tablet Take 1 tablet (40 mg total) by mouth daily.  ? B Complex Vitamins (B COMPLEX PO) Take by mouth as directed.  ? blood glucose meter kit and supplies Dispense based on patient and insurance preference. Check CBG once a day (FOR ICD-10 E10.9, E11.9).  ? esomeprazole (NEXIUM) 40 MG capsule Take 1 capsule by mouth once daily  ? famotidine (PEPCID) 40 MG tablet Take 1 tablet (40 mg total) by mouth daily.  ? glucose blood (ACCU-CHEK GUIDE) test strip USE TO CHECK CBG ONCE A DAY  ? hydrochlorothiazide (HYDRODIURIL) 25 MG tablet Take 0.5 tablets (12.5 mg total) by mouth daily. Pt reports she takes half of  25 mg tablet  ? ibuprofen (ADVIL) 800 MG tablet Take by mouth.  ? losartan (COZAAR) 100 MG tablet Take 1 tablet (100 mg total) by mouth daily.  ? Multiple Vitamin (MULTI-VITAMIN) tablet Take by mouth.  ? Omega-3 Fatty Acids (FISH OIL PO) Take by mouth as directed.  ? Probiotic Product (PROBIOTIC DAILY PO) Take by mouth.  ? traZODone (DESYREL) 50 MG tablet Take 0.5-1 tablets (25-50 mg total) by mouth at bedtime as needed for sleep.  ? vitamin C (ASCORBIC ACID) 500 MG tablet Take 1 tablet (500 mg total) by mouth daily.  ? ZINC OXIDE PO Take by mouth.  ? ?No facility-administered encounter medications on file as of 07/21/2021.  ? ? ?Patient Active Problem List  ? Diagnosis Date Noted  ? Abdominal bloating 07/15/2020  ? Abnormal feces 07/15/2020  ? Colon cancer screening 07/15/2020  ? Family history of malignant neoplasm of gastrointestinal tract 07/15/2020  ? Generalized abdominal pain 07/15/2020  ? Personal history of colonic polyps 07/15/2020  ? Rectal bleeding 07/15/2020  ? HSV (herpes simplex virus) anogenital infection 07/15/2020  ? Rectal itching 07/15/2020  ? Bladder prolapse, female, acquired 04/28/2019  ? Controlled type 2 diabetes mellitus with neuropathy ([place]) 10/27/2018  ? OA (osteoarthritis) of hip 05/24/2017  ? DDD (degenerative disc disease), lumbar 05/24/2017  ? GERD (gastroesophageal reflux disease) 02/10/2016  ? Essential hypertension 07/26/2015  ? Hyperlipidemia 07/26/2015  ? Insomnia secondary to depression with anxiety 07/26/2015  ? Osteopenia   ? Palpitations   ? Colon polyps   ? ? ?Conditions to be addressed/monitored:HTN and  DMII ? ?Care Plan : RN Care Manager plan of care  ?Updates made by Kassie Mends, RN since 07/21/2021 12:00 AM  ?  ? ?Problem: No plan of care established for management of chronic disease states (HTN, DM2)   ?Priority: High  ?  ? ?Long-Range Goal: Development of plan of care for chronic disease management (HTN, DM2)   ?Start Date: 03/06/2021  ?Expected End Date: 11/08/2021   ?Priority: High  ?Note:   ?Current Barriers:  ?Knowledge Deficits related to plan of care for management of HTN and DMII  ?Knowledge Deficits related to basic Diabetes pathophysiology and self care/management- pt needs reinforcement for ADA/ carbohydrate modified diet, pt does not always eat enough vegetables, drinks sugary drinks at times and reports she would like to do better, pt gets outside and walks her dog daily, is now going to [facility] for aerobics, yoga and pilates. Pt has been checking CBG approximately 2 x per week with readings all in 90's to low 100's range (110, 98).  Reports most recent blood pressure reading is within normal limits and checks on occasion. Pt reports she lives with spouse, is independent with all aspects of her care, has episodes of depression at times, is working with counselor and feels this is helping. Pt is working with LCSW for depression management, pt reports she did receive advanced directives packet but has not completed. Patient reports she does have acyclovir and is taking daily as prescribed (obtained with [facility] card and was able to afford)  Patient reports she stayed as a patient at [facility]. ?Does not adhere to provider recommendations re:  carbohydrate modified diet. ?Knowledge Deficits related to basic understanding of hypertension pathophysiology and self care management- Reports checks blood pressure on occasion, reports her cuff is old and she may get new cuff from [facility] benefit catalog. ? ?RNCM Clinical Goal(s):  ?Patient will verbalize understanding of plan for management of HTN and DMII as evidenced by patient report, review of EHR and  through collaboration with RN Care manager, provider, and care team.  ? ?Interventions: ?1:1 collaboration with primary care provider regarding development and update of comprehensive plan of care as evidenced by provider attestation and co-signature ?Inter-disciplinary care team collaboration (see  longitudinal plan of care) ?Evaluation of current treatment plan related to  self management and patient's adherence to plan as established by provider ? ? ?Diabetes Interventions:  (Status:  Goal on track:  Yes.) Long Term Goal ?Assessed patient's understanding of A1c goal: <7% ?Reviewed medications with patient and discussed importance of medication adherence ?Review of patient status, including review of consultants reports, relevant laboratory and other test results, and medications completed ?Reinforced carbohydrate modified diet, portion control, eliminating sugary soft drinks ?Reviewed CBG log with patient ?Reviewed importance of taking acyclovir as prescribed ?Pain assessment completed ?Continue working with counselor and Education officer, museum for depression management ?Lab Results  ?Component Value Date  ? HGBA1C 6.9 (H) 11/07/2020  ? ?Hypertension Interventions:  (Status:  Goal on track:  Yes.) Long Term Goal ?Last practice recorded BP readings:  ?BP Readings from Last 3 Encounters:  ?12/19/20 118/68  ?11/07/20 132/66  ?08/15/20 140/84  ?Most recent eGFR/CrCl:  ?Lab Results  ?Component Value Date  ? EGFR 62 12/19/2020  ?  No components found for: CRCL ? ?Advised patient, providing education and rationale, to monitor blood pressure daily and record, calling PCP for findings outside established parameters ?Reviewed low sodium diet and importance of reading food labels, avoiding salty  snacks and fast food ?Encouraged patient with continuing her exercising outdoors/ walking, aerobics and yoga ?Reviewed importance of limiting/ avoiding spicy, greasy foods to help with reflux/ bloating ?Reviewed importance of taking medications as prescribed ? ?Patient Goals/Self-Care Activities: ?Take all medications as prescribed ?Attend all scheduled provider appointments ?Perform all self care activities independently  ?Call provider office for new concerns or questions  ?Continue walking outdoors with your dog- keep up the good  work ?Be mindful of carbohydrate intake- too much bread, pasta, rice, potatoes at one meal will elevate blood sugar ?Avoid/ limit spicy, greasy foods in your diet ?Check blood pressure at least 3 x per week an

## 2021-07-25 ENCOUNTER — Ambulatory Visit: Payer: Medicare HMO | Admitting: *Deleted

## 2021-07-25 DIAGNOSIS — I10 Essential (primary) hypertension: Secondary | ICD-10-CM

## 2021-07-25 DIAGNOSIS — E119 Type 2 diabetes mellitus without complications: Secondary | ICD-10-CM

## 2021-07-25 DIAGNOSIS — F418 Other specified anxiety disorders: Secondary | ICD-10-CM

## 2021-07-25 NOTE — Patient Instructions (Signed)
Visit Information ? ?Thank you for taking time to visit with me today. Please don't hesitate to contact me if I can be of assistance to you before our next scheduled telephone appointment. ? ?Our next appointment is by telephone on 09/05/21  ? ?Please call the care guide team at [phone] if you need to cancel or reschedule your appointment.  ? ?If you are experiencing a Mental Health or [place] or need someone to talk to, please call the [place] National Suicide Prevention Lifeline: (256) 446-9146 or TTY: [phone] TTY (947)838-3422) to talk to a trained counselor ?call 911  ? ?Patient verbalizes understanding of instructions and care plan provided today and agrees to view in [place]. Active MyChart status confirmed with patient.   ?Eduard Clos MSW, LCSW ?Licensed Clinical Social Worker ?[place]    ?[phone]  ?

## 2021-07-25 NOTE — Chronic Care Management (AMB) (Signed)
?    07/25/2021  ?  2:05 PM 05/16/2021  ?  2:02 PM 05/01/2021  ? 10:49 AM 03/20/2021  ?  8:43 AM 01/09/2021  ?  4:01 PM  ?Depression screen PHQ 2/9  ?Decreased Interest '1 1 2 1 3  '$ ?Down, Depressed, Hopeless '1 1 1 1 1  '$ ?PHQ - 2 Score '2 2 3 2 4  '$ ?Altered sleeping '1 1 1 2 2  '$ ?Tired, decreased energy '2 2 2 1 1  '$ ?Change in appetite '1 1 1 '$ 0 1  ?Feeling bad or failure about yourself  '1 2 3 1 2  '$ ?Trouble concentrating '2 1 1 3 1  '$ ?Moving slowly or fidgety/restless 1 0 0 1 1  ?Suicidal thoughts 0 0 0 0 0  ?PHQ-9 Score '10 9 11 10 12  '$ ?Difficult doing work/chores Somewhat difficult  Somewhat difficult  Somewhat difficult  ?  ?

## 2021-07-26 DIAGNOSIS — F331 Major depressive disorder, recurrent, moderate: Secondary | ICD-10-CM | POA: Diagnosis not present

## 2021-08-06 DIAGNOSIS — E119 Type 2 diabetes mellitus without complications: Secondary | ICD-10-CM

## 2021-08-06 DIAGNOSIS — I10 Essential (primary) hypertension: Secondary | ICD-10-CM

## 2021-08-06 DIAGNOSIS — F418 Other specified anxiety disorders: Secondary | ICD-10-CM

## 2021-08-07 ENCOUNTER — Telehealth: Payer: Self-pay

## 2021-08-07 DIAGNOSIS — I10 Essential (primary) hypertension: Secondary | ICD-10-CM

## 2021-08-07 MED ORDER — LOSARTAN POTASSIUM 100 MG PO TABS: 100.0000 mg | ORAL_TABLET | Freq: Every day | ORAL | 1 refills | Status: DC

## 2021-08-07 NOTE — Telephone Encounter (Signed)
Pharmacy faxed a refill request for ?losartan (COZAAR) 100 MG tablet [[id number]]  ?  Order Details ?Dose: 100 mg Route: Oral Frequency: Daily  ?Dispense Quantity: 90 tablet Refills: 1   ?     ?Sig: Take 1 tablet (100 mg total) by mouth daily.  ?     ?Start Date: 06/05/21 End Date: --  ?Written Date: 06/05/21 Expiration Date: 06/05/22  ? ?

## 2021-08-11 DIAGNOSIS — F331 Major depressive disorder, recurrent, moderate: Secondary | ICD-10-CM | POA: Diagnosis not present

## 2021-08-22 DIAGNOSIS — I83813 Varicose veins of bilateral lower extremities with pain: Secondary | ICD-10-CM | POA: Diagnosis not present

## 2021-08-22 DIAGNOSIS — M7989 Other specified soft tissue disorders: Secondary | ICD-10-CM | POA: Diagnosis not present

## 2021-08-28 DIAGNOSIS — R252 Cramp and spasm: Secondary | ICD-10-CM | POA: Diagnosis not present

## 2021-08-28 DIAGNOSIS — I872 Venous insufficiency (chronic) (peripheral): Secondary | ICD-10-CM | POA: Diagnosis not present

## 2021-08-30 DIAGNOSIS — F331 Major depressive disorder, recurrent, moderate: Secondary | ICD-10-CM | POA: Diagnosis not present

## 2021-09-05 ENCOUNTER — Ambulatory Visit (INDEPENDENT_AMBULATORY_CARE_PROVIDER_SITE_OTHER): Payer: Medicare HMO | Admitting: *Deleted

## 2021-09-05 DIAGNOSIS — I10 Essential (primary) hypertension: Secondary | ICD-10-CM

## 2021-09-05 DIAGNOSIS — F418 Other specified anxiety disorders: Secondary | ICD-10-CM

## 2021-09-05 DIAGNOSIS — E119 Type 2 diabetes mellitus without complications: Secondary | ICD-10-CM

## 2021-09-05 NOTE — Patient Instructions (Signed)
Visit Information  Thank you for taking time to visit with me today. Please don't hesitate to contact me if I can be of assistance to you before our next scheduled telephone appointment.  Following are the goals we discussed today:  (Copy and paste patient goals from clinical care plan here)  Our next appointment is by telephone on 09/26/21  Please call the care guide team at [phone] if you need to cancel or reschedule your appointment.   If you are experiencing a Mental Health or [place] or need someone to talk to, please call 1-800-273-TALK (toll free, 24 hour hotline) call 911   The patient verbalized understanding of instructions, educational materials, and care plan provided today and DECLINED offer to receive copy of patient instructions, educational materials, and care plan.   Eduard Clos MSW, LCSW Licensed Clinical Social Worker [place]    [phone]

## 2021-09-05 NOTE — Chronic Care Management (AMB) (Signed)
Chronic Care Management    Clinical Social Work Note  09/05/2021 Name: Nina Olson MRN: [id number] DOB: 04/17/1945  Nina Olson is a 75 y.o. year old female who is a primary care patient of Pickard, Warren T, MD. The CCM team was consulted to assist the patient with chronic disease management and/or care coordination needs related to: Community Resources  and Mental Health Counseling and Resources.   Engaged with patient by telephone for follow up visit in response to provider referral for social work chronic care management and care coordination services.   Consent to Services:  The patient was given information about Chronic Care Management services, agreed to services, and gave verbal consent prior to initiation of services.  Please see initial visit note for detailed documentation.   Patient agreed to services and consent obtained.   Assessment: Review of patient past medical history, allergies, medications, and health status, including review of relevant consultants reports was performed today as part of a comprehensive evaluation and provision of chronic care management and care coordination services.     SDOH (Social Determinants of Health) assessments and interventions performed:  SDOH Interventions    Flowsheet Row Most Recent Value  SDOH Interventions   Depression Interventions/Treatment  Medication, Counseling        Advanced Directives Status: Not addressed in this encounter.  CCM Care Plan  No Known Allergies  Outpatient Encounter Medications as of 09/05/2021  Medication Sig   Accu-Chek Softclix Lancets lancets USE TO CHECK CBG ONCE A DAY   atorvastatin (LIPITOR) 40 MG tablet Take 1 tablet (40 mg total) by mouth daily.   B Complex Vitamins (B COMPLEX PO) Take by mouth as directed.   blood glucose meter kit and supplies Dispense based on patient and insurance preference. Check CBG once a day (FOR ICD-10 E10.9, E11.9).   esomeprazole (NEXIUM) 40 MG capsule Take 1  capsule by mouth once daily   famotidine (PEPCID) 40 MG tablet Take 1 tablet (40 mg total) by mouth daily.   glucose blood (ACCU-CHEK GUIDE) test strip USE TO CHECK CBG ONCE A DAY   hydrochlorothiazide (HYDRODIURIL) 25 MG tablet Take 0.5 tablets (12.5 mg total) by mouth daily. Pt reports she takes half of 25 mg tablet   ibuprofen (ADVIL) 800 MG tablet Take by mouth.   losartan (COZAAR) 100 MG tablet Take 1 tablet (100 mg total) by mouth daily.   Multiple Vitamin (MULTI-VITAMIN) tablet Take by mouth.   Omega-3 Fatty Acids (FISH OIL PO) Take by mouth as directed.   Probiotic Product (PROBIOTIC DAILY PO) Take by mouth.   traZODone (DESYREL) 50 MG tablet Take 0.5-1 tablets (25-50 mg total) by mouth at bedtime as needed for sleep.   vitamin C (ASCORBIC ACID) 500 MG tablet Take 1 tablet (500 mg total) by mouth daily.   ZINC OXIDE PO Take by mouth.   No facility-administered encounter medications on file as of 09/05/2021.    Patient Active Problem List   Diagnosis Date Noted   Abdominal bloating 07/15/2020   Abnormal feces 07/15/2020   Colon cancer screening 07/15/2020   Family history of malignant neoplasm of gastrointestinal tract 07/15/2020   Generalized abdominal pain 07/15/2020   Personal history of colonic polyps 07/15/2020   Rectal bleeding 07/15/2020   HSV (herpes simplex virus) anogenital infection 07/15/2020   Rectal itching 07/15/2020   Bladder prolapse, female, acquired 04/28/2019   Controlled type 2 diabetes mellitus with neuropathy (HCC) 10/27/2018   OA (osteoarthritis) of hip 05/24/2017     DDD (degenerative disc disease), lumbar 05/24/2017   GERD (gastroesophageal reflux disease) 02/10/2016   Essential hypertension 07/26/2015   Hyperlipidemia 07/26/2015   Insomnia secondary to depression with anxiety 07/26/2015   Osteopenia    Palpitations    Colon polyps     Conditions to be addressed/monitored: Depression; Mental Health Concerns   Care Plan : LCSW Plan of Care   Updates made by Caldwell, Janet P, LCSW since 09/05/2021 12:00 AM     Problem: Symptoms (Depression)      Long-Range Goal: Symptoms Monitored and Managed   Start Date: 12/21/2020  Expected End Date: 02/05/2022  This Visit's Progress: On track  Recent Progress: On track  Priority: High  Note:   Current Barriers:  Chronic Mental Health needs related to depression /sadness Mental Health Concerns  Suicidal Ideation/Homicidal Ideation: No  Clinical Social Work Goal(s):  patient will work with SW  PRN  by telephone or in person to reduce or manage symptoms related to depression demonstrate a reduction in symptoms related to :Depression, Grief, and Stress at family concerns  explore community resource options for unmet needs related to:[place] counseling   Interventions:  09/05/21- CSW spoke with pt who reports doing much better with her depression and overall. Pt had her last counseling session with her therapist and feels she does not need to continue.  CSW completed depression screening with pt today; scoring "7" and better/lower than previously.     09/05/2021   10:12 AM 07/25/2021    2:05 PM 05/16/2021    2:02 PM 05/01/2021   10:49 AM 03/20/2021    8:43 AM  Depression screen PHQ 2/9  Decreased Interest 0 1 1 2 1  Down, Depressed, Hopeless 1 1 1 1 1  PHQ - 2 Score 1 2 2 3 2  Altered sleeping 1 1 1 1 2  Tired, decreased energy 1 2 2 2 1  Change in appetite 1 1 1 1 0  Feeling bad or failure about yourself  1 1 2 3 1  Trouble concentrating 2 2 1 1 3  Moving slowly or fidgety/restless 0 1 0 0 1  Suicidal thoughts 0 0 0 0 0  PHQ-9 Score 7 10 9 11 10  Difficult doing work/chores Somewhat difficult Somewhat difficult  Somewhat difficult     07/25/21- CSW spoke with pt who is pleased Dr Pickard was able to accept her as a pt since her husband also goes to him. Pt shared she saw PCP and he started her on some new RX to help with her reflux.  CSW completed depression screening with pt  today- she remains about the same as Feb and in the mild/mod category. Pt is active; going to the [facility] for yoga, pilates, etc- also "learning some computer tech information".  Pt continues with counseling and admits to worry about family members. Talked with pt about her worry/anxiety and the relation this may also have on her reflux. Encouraged pt to talk with her counselor about the anxiety/worry also.   06/27/21-  CSW spoke with pt today who is complaining of "reflux, bubbling in my stomach".  CSW offered to make RNCM aware and also suggested she reach out to PCP office- she reports she is still trying to get connected with a new provider but plans to call [facility] and see if they are able to keep her and reassign her to a new PCP. Also, she plans to make them aware of these current issues.  Pt continues with   her counseling support as well as going to the gym 3 times weekly for yoga, aerobics, etc.  CSW will plan to reassess depression and PHQ9 at next visit.  CSW spoke with pt who continues with her virtual counseling appointments. Pt reports this is going well- she feels it is helpful. CSW completed PHQ9 Depression Screening with pt today- pt scoring less/better than prior-  Depression screen [facility] 2/9 05/16/2021 05/01/2021 03/20/2021 01/09/2021 11/28/2020  Decreased Interest _0 Down, Depressed, Hopeless _1 PHQ - 2 Score _2 Altered sleeping _3 Tired, decreased energy _4 Change in appetite 1 1 0 1 0  Feeling bad or failure about yourself  _5 0  Trouble concentrating _6 Moving slowly or fidgety/restless 0 0 1 1 0  Suicidal thoughts 0 0 0 0 0  PHQ-9 Score _7 Difficult doing work/chores - Somewhat difficult - Somewhat difficult Somewhat difficult  Some recent data might be hidden          Also reminded pt to use the [facility] OTC catalog benefit before the end of this quarter/year.   Patient interviewed and appropriate assessments  performed: brief mental health assessment Recent PHQ9 score of "9" 1:1 collaboration with Eulogio Bear, NP regarding development and update of comprehensive plan of care as evidenced by provider attestation and co-signature Patient interviewed and appropriate assessments performed Provided mental health counseling with regard to depression and allowing herself to not hold guilt/blame for other's issues Provided patient with information about depression, treatment options, etc Referred patient to [facility] for long term follow up and therapy/counseling Depression screen reviewed , Active listening / Reflection utilized , Emotional Supportive Provided, Provided brief CBT , Quality of sleep assessed & Sleep Hygiene techniques promoted , Participation in counseling encouraged , and Discussed referral to [facility] to assist with connecting to mental health provider  Patient Self Care Activities:  Self administers medications as prescribed Attends all scheduled provider appointments Attends church or other social activities Calls provider office for new concerns or questions Ability for insight Motivation for treatment  Patient Coping Strengths:  Supportive [place] to Communicate Effectively  Patient Self Care Deficits:  Holding on to guilt/blaming self for others  Patient Goals:  -attend all counseling appointments -consider attending exercise programs again - call to cancel if needed - keep a calendar with appointment dates  Follow Up Plan: Appointment scheduled for SW follow up with client by phone on:  09/26/21      Follow Up Plan: Appointment scheduled for SW follow up with client by phone on:  09/26/21     Eduard Clos MSW, [place] Licensed Clinical Social Worker [place]    [phone]

## 2021-09-06 DIAGNOSIS — F32A Depression, unspecified: Secondary | ICD-10-CM

## 2021-09-11 ENCOUNTER — Other Ambulatory Visit: Payer: Medicare HMO

## 2021-09-18 ENCOUNTER — Other Ambulatory Visit: Payer: Self-pay

## 2021-09-18 ENCOUNTER — Other Ambulatory Visit: Payer: Medicare HMO

## 2021-09-18 DIAGNOSIS — E782 Mixed hyperlipidemia: Secondary | ICD-10-CM

## 2021-09-18 DIAGNOSIS — E1140 Type 2 diabetes mellitus with diabetic neuropathy, unspecified: Secondary | ICD-10-CM

## 2021-09-18 DIAGNOSIS — I10 Essential (primary) hypertension: Secondary | ICD-10-CM

## 2021-09-19 ENCOUNTER — Other Ambulatory Visit: Payer: Self-pay

## 2021-09-19 DIAGNOSIS — K219 Gastro-esophageal reflux disease without esophagitis: Secondary | ICD-10-CM

## 2021-09-19 LAB — CBC WITH DIFFERENTIAL/PLATELET
Absolute Monocytes: 673 cells/uL (ref 200–950)
Basophils Absolute: 31 cells/uL (ref 0–200)
Basophils Relative: 0.7 %
Eosinophils Absolute: 207 cells/uL (ref 15–500)
Eosinophils Relative: 4.7 %
HCT: 40 % (ref 35.0–45.0)
Hemoglobin: 13 g/dL (ref 11.7–15.5)
Lymphs Abs: 1540 cells/uL (ref 850–3900)
MCH: 28.1 pg (ref 27.0–33.0)
MCHC: 32.5 g/dL (ref 32.0–36.0)
MCV: 86.6 fL (ref 80.0–100.0)
MPV: 9.6 fL (ref 7.5–12.5)
Monocytes Relative: 15.3 %
Neutro Abs: 1949 cells/uL (ref 1500–7800)
Neutrophils Relative %: 44.3 %
Platelets: 292 10*3/uL (ref 140–400)
RBC: 4.62 10*6/uL (ref 3.80–5.10)
RDW: 13.1 % (ref 11.0–15.0)
Total Lymphocyte: 35 %
WBC: 4.4 10*3/uL (ref 3.8–10.8)

## 2021-09-19 LAB — COMPREHENSIVE METABOLIC PANEL
AG Ratio: 1.4 (calc) (ref 1.0–2.5)
ALT: 15 U/L (ref 6–29)
AST: 16 U/L (ref 10–35)
Albumin: 4 g/dL (ref 3.6–5.1)
Alkaline phosphatase (APISO): 112 U/L (ref 37–153)
BUN: 14 mg/dL (ref 7–25)
CO2: 29 mmol/L (ref 20–32)
Calcium: 9.4 mg/dL (ref 8.6–10.4)
Chloride: 106 mmol/L (ref 98–110)
Creat: 0.94 mg/dL (ref 0.60–1.00)
Globulin: 2.9 g/dL (calc) (ref 1.9–3.7)
Glucose, Bld: 104 mg/dL — ABNORMAL HIGH (ref 65–99)
Potassium: 4 mmol/L (ref 3.5–5.3)
Sodium: 143 mmol/L (ref 135–146)
Total Bilirubin: 0.4 mg/dL (ref 0.2–1.2)
Total Protein: 6.9 g/dL (ref 6.1–8.1)

## 2021-09-19 LAB — HEMOGLOBIN A1C
Hgb A1c MFr Bld: 6.6 % of total Hgb — ABNORMAL HIGH (ref <5.7)
Mean Plasma Glucose: 143 mg/dL
eAG (mmol/L): 7.9 mmol/L

## 2021-09-19 LAB — LIPID PANEL
Cholesterol: 160 mg/dL (ref <200)
HDL: 44 mg/dL — ABNORMAL LOW (ref >=50)
LDL Cholesterol (Calc): 99 mg/dL (calc)
Non-HDL Cholesterol (Calc): 116 mg/dL (calc) (ref <130)
Total CHOL/HDL Ratio: 3.6 (calc) (ref <5.0)
Triglycerides: 77 mg/dL (ref <150)

## 2021-09-19 MED ORDER — FAMOTIDINE 40 MG PO TABS: 40.0000 mg | ORAL_TABLET | Freq: Every day | ORAL | 1 refills | Status: DC

## 2021-09-26 ENCOUNTER — Telehealth: Payer: Self-pay

## 2021-09-26 ENCOUNTER — Telehealth: Payer: Self-pay | Admitting: *Deleted

## 2021-09-26 NOTE — Telephone Encounter (Signed)
  Care Management   Follow Up Note   09/26/2021 Name: Nina Olson MRN: [id number] DOB: 1945/12/05   Referred by: Susy Frizzle, MD Reason for referral : No chief complaint on file.   An unsuccessful telephone outreach was attempted today. The patient was referred to the case management team for assistance with care management and care coordination.   Follow Up Plan: The care management team will reach out to the patient again over the next 10 days.   Eduard Clos MSW, LCSW Licensed Clinical Social Worker [place]    [phone]

## 2021-10-11 ENCOUNTER — Ambulatory Visit: Payer: Medicare HMO | Admitting: Podiatry

## 2021-10-11 DIAGNOSIS — L601 Onycholysis: Secondary | ICD-10-CM

## 2021-10-11 DIAGNOSIS — B351 Tinea unguium: Secondary | ICD-10-CM | POA: Diagnosis not present

## 2021-10-11 DIAGNOSIS — L603 Nail dystrophy: Secondary | ICD-10-CM | POA: Diagnosis not present

## 2021-10-11 MED ORDER — FLUCONAZOLE 150 MG PO TABS: 150.0000 mg | ORAL_TABLET | ORAL | 0 refills | Status: DC

## 2021-10-11 NOTE — Progress Notes (Signed)
  Subjective:  Patient ID: Nina Olson, female    DOB: 1945/06/14,  MRN: [id number]  Chief Complaint  Patient presents with   Nail Problem        EST - R GREAT TOE NAIL IS COMING OFF/ PEELING BACK    76 y.o. female presents with the above complaint. History confirmed with patient.  She noticed the return elliptica with sweating this happens every couple months it is never really looks normal for some time.  Objective:  Physical Exam: warm, good capillary refill, no trophic changes or ulcerative lesions, normal DP and PT pulses, normal sensory exam, and right hallux nail mycosis medial one third extending to proximal nail fold with cracking and onycholysis.  Assessment:   1. Onychomycosis   2. Nail dystrophy   3. Onycholysis      Plan:  Patient was evaluated and treated and all questions answered.  We discussed etiology and treatment options of her onychomycosis and nail dystrophy and onycholysis due to this.  I recommended temporary total nail avulsion due to the significant amount of nail involvement and dystrophy.  We also discussed treating with oral medications.  Following sterile prep with Betadine and alcohol and 1.5 cc each digital block with 2% lidocaine and 0.5% Marcaine plain, a Soil scientist was used to avulsed the right hallux nail plate.  No matricectomy was performed.  I placed her on Diflucan weekly dosing for 6 months.  Advised the nail will take 3 to 6 months to regrow, if she has further issues with it after it regrows and take the Diflucan she will return to see me as needed.  Return if symptoms worsen or fail to improve.

## 2021-10-11 NOTE — Patient Instructions (Signed)

## 2021-10-13 ENCOUNTER — Telehealth: Payer: Self-pay | Admitting: *Deleted

## 2021-10-13 ENCOUNTER — Telehealth: Payer: Self-pay

## 2021-10-13 NOTE — Telephone Encounter (Signed)
  Care Management   Follow Up Note   10/13/2021 Name: Nina Olson MRN: [id number] DOB: 17-Nov-1945   Referred by: Susy Frizzle, MD Reason for referral : Chronic Care Management (HTN, DM2)   A second unsuccessful telephone outreach was attempted today. The patient was referred to the case management team for assistance with care management and care coordination.   Follow Up Plan: Telephone follow up appointment with care management team member scheduled for:  upon care guide rescheduling.  Jacqlyn Larsen RNC, BSN RN Case Manager [facility] [phone]

## 2021-10-17 ENCOUNTER — Ambulatory Visit (INDEPENDENT_AMBULATORY_CARE_PROVIDER_SITE_OTHER): Payer: Medicare HMO | Admitting: *Deleted

## 2021-10-17 DIAGNOSIS — F418 Other specified anxiety disorders: Secondary | ICD-10-CM

## 2021-10-17 DIAGNOSIS — I10 Essential (primary) hypertension: Secondary | ICD-10-CM

## 2021-10-17 DIAGNOSIS — E1140 Type 2 diabetes mellitus with diabetic neuropathy, unspecified: Secondary | ICD-10-CM

## 2021-10-17 NOTE — Chronic Care Management (AMB) (Signed)
Chronic Care Management    Clinical Social Work Note  10/17/2021 Name: Nina Olson MRN: [id number] DOB: July 31, 1945  Nina Olson is a 76 y.o. year old female who is a primary care patient of Pickard, Cammie Mcgee, MD. The CCM team was consulted to assist the patient with chronic disease management and/or care coordination needs related to: Mental Health Counseling and Resources.   Engaged with patient by telephone for follow up visit in response to provider referral for social work chronic care management and care coordination services.   Consent to Services:  The patient was given information about Chronic Care Management services, agreed to services, and gave verbal consent prior to initiation of services.  Please see initial visit note for detailed documentation.   Patient agreed to services and consent obtained.   Assessment: Review of patient past medical history, allergies, medications, and health status, including review of relevant consultants reports was performed today as part of a comprehensive evaluation and provision of chronic care management and care coordination services.     SDOH (Social Determinants of Health) assessments and interventions performed:    Advanced Directives Status: Not addressed in this encounter.  CCM Care Plan  No Known Allergies  Outpatient Encounter Medications as of 10/17/2021  Medication Sig   Accu-Chek Softclix Lancets lancets USE TO CHECK CBG ONCE A DAY   atorvastatin (LIPITOR) 40 MG tablet Take 1 tablet (40 mg total) by mouth daily.   B Complex Vitamins (B COMPLEX PO) Take by mouth as directed.   blood glucose meter kit and supplies Dispense based on patient and insurance preference. Check CBG once a day (FOR ICD-10 E10.9, E11.9).   esomeprazole (NEXIUM) 40 MG capsule Take 1 capsule by mouth once daily   famotidine (PEPCID) 40 MG tablet Take 1 tablet (40 mg total) by mouth daily.   fluconazole (DIFLUCAN) 150 MG tablet Take 1 tablet (150 mg  total) by mouth once a week for 26 doses.   glucose blood (ACCU-CHEK GUIDE) test strip USE TO CHECK CBG ONCE A DAY   hydrochlorothiazide (HYDRODIURIL) 25 MG tablet Take 0.5 tablets (12.5 mg total) by mouth daily. Pt reports she takes half of 25 mg tablet   ibuprofen (ADVIL) 800 MG tablet Take by mouth.   losartan (COZAAR) 100 MG tablet Take 1 tablet (100 mg total) by mouth daily.   Multiple Vitamin (MULTI-VITAMIN) tablet Take by mouth.   Omega-3 Fatty Acids (FISH OIL PO) Take by mouth as directed.   Probiotic Product (PROBIOTIC DAILY PO) Take by mouth.   traZODone (DESYREL) 50 MG tablet Take 0.5-1 tablets (25-50 mg total) by mouth at bedtime as needed for sleep.   vitamin C (ASCORBIC ACID) 500 MG tablet Take 1 tablet (500 mg total) by mouth daily.   ZINC OXIDE PO Take by mouth.   No facility-administered encounter medications on file as of 10/17/2021.    Patient Active Problem List   Diagnosis Date Noted   Abdominal bloating 07/15/2020   Abnormal feces 07/15/2020   Colon cancer screening 07/15/2020   Family history of malignant neoplasm of gastrointestinal tract 07/15/2020   Generalized abdominal pain 07/15/2020   Personal history of colonic polyps 07/15/2020   Rectal bleeding 07/15/2020   HSV (herpes simplex virus) anogenital infection 07/15/2020   Rectal itching 07/15/2020   Bladder prolapse, female, acquired 04/28/2019   Controlled type 2 diabetes mellitus with neuropathy ([place]) 10/27/2018   OA (osteoarthritis) of hip 05/24/2017   DDD (degenerative disc disease), lumbar 05/24/2017  GERD (gastroesophageal reflux disease) 02/10/2016   Essential hypertension 07/26/2015   Hyperlipidemia 07/26/2015   Insomnia secondary to depression with anxiety 07/26/2015   Osteopenia    Palpitations    Colon polyps     Conditions to be addressed/monitored: Depression; Mental Health Concerns   Care Plan : LCSW Plan of Care  Updates made by Deirdre Peer, LCSW since 10/17/2021 12:00 AM      Problem: Symptoms (Depression)      Long-Range Goal: Symptoms Monitored and Managed   Start Date: 12/21/2020  Expected End Date: 02/05/2022  Recent Progress: On track  Priority: High  Note:   Current Barriers:  Chronic Mental Health needs related to depression /sadness Mental Health Concerns  Suicidal Ideation/Homicidal Ideation: No  Clinical Social Work Goal(s):  patient will work with SW  PRN  by telephone or in person to reduce or manage symptoms related to depression demonstrate a reduction in symptoms related to :Depression, Grief, and Stress at family concerns  explore community resource options for unmet needs related YN:[id number] counseling   Interventions: 10/17/21- Pt is interested in getting connected with a new counselor- she has decided she wants to continue with this; however her previous counselor ended. CSW will place referral to [facility] [place].   09/05/21- CSW spoke with pt who reports doing much better with her depression and overall. Pt had her last counseling session with her therapist and feels she does not need to continue.  CSW completed depression screening with pt today; scoring "7" and better/lower than previously.     09/05/2021   10:12 AM 07/25/2021    2:05 PM 05/16/2021    2:02 PM 05/01/2021   10:49 AM 03/20/2021    8:43 AM  Depression screen PHQ 2/9  Decreased Interest 0 1 1 2 1   Down, Depressed, Hopeless 1 1 1 1 1   PHQ - 2 Score 1 2 2 3 2   Altered sleeping 1 1 1 1 2   Tired, decreased energy 1 2 2 2 1   Change in appetite 1 1 1 1  0  Feeling bad or failure about yourself  1 1 2 3 1   Trouble concentrating 2 2 1 1 3   Moving slowly or fidgety/restless 0 1 0 0 1  Suicidal thoughts 0 0 0 0 0  PHQ-9 Score 7 10 9 11 10   Difficult doing work/chores Somewhat difficult Somewhat difficult  Somewhat difficult     07/25/21- CSW spoke with pt who is pleased Dr Dennard Schaumann was able to accept her as a pt since her husband also goes to him. Pt shared she saw  PCP and he started her on some new RX to help with her reflux.  CSW completed depression screening with pt today- she remains about the same as Feb and in the mild/mod category. Pt is active; going to the [facility] for yoga, pilates, etc- also "learning some computer tech information".  Pt continues with counseling and admits to worry about family members. Talked with pt about her worry/anxiety and the relation this may also have on her reflux. Encouraged pt to talk with her counselor about the anxiety/worry also.   06/27/21-  CSW spoke with pt today who is complaining of "reflux, bubbling in my stomach".  CSW offered to make RNCM aware and also suggested she reach out to PCP office- she reports she is still trying to get connected with a new provider but plans to call [facility] and see if they are able to keep her and reassign  her to a new PCP. Also, she plans to make them aware of these current issues.  Pt continues with her counseling support as well as going to the gym 3 times weekly for yoga, aerobics, etc.  CSW will plan to reassess depression and PHQ9 at next visit.  CSW spoke with pt who continues with her virtual counseling appointments. Pt reports this is going well- she feels it is helpful. CSW completed PHQ9 Depression Screening with pt today- pt scoring less/better than prior-  Depression screen [facility] 2/9 05/16/2021 05/01/2021 03/20/2021 01/09/2021 11/28/2020  Decreased Interest 1 2 1 3 3   Down, Depressed, Hopeless 1 1 1 1 2   PHQ - 2 Score 2 3 2 4 5   Altered sleeping 1 1 2 2 2   Tired, decreased energy 2 2 1 1 1   Change in appetite 1 1 0 1 0  Feeling bad or failure about yourself  2 3 1 2  0  Trouble concentrating 1 1 3 1 1   Moving slowly or fidgety/restless 0 0 1 1 0  Suicidal thoughts 0 0 0 0 0  PHQ-9 Score 9 11 10 12 9   Difficult doing work/chores - Somewhat difficult - Somewhat difficult Somewhat difficult  Some recent data might be hidden          Also reminded pt to use the [facility] OTC  catalog benefit before the end of this quarter/year.   Patient interviewed and appropriate assessments performed: brief mental health assessment Recent PHQ9 score of "9" 1:1 collaboration with Eulogio Bear, NP regarding development and update of comprehensive plan of care as evidenced by provider attestation and co-signature Patient interviewed and appropriate assessments performed Provided mental health counseling with regard to depression and allowing herself to not hold guilt/blame for other's issues Provided patient with information about depression, treatment options, etc Referred patient to [facility] for long term follow up and therapy/counseling Depression screen reviewed , Active listening / Reflection utilized , Emotional Supportive Provided, Provided brief CBT , Quality of sleep assessed & Sleep Hygiene techniques promoted , Participation in counseling encouraged , and Discussed referral to [facility] to assist with connecting to mental health provider  Patient Self Care Activities:  Self administers medications as prescribed Attends all scheduled provider appointments Attends church or other social activities Calls provider office for new concerns or questions Ability for insight Motivation for treatment  Patient Coping Strengths:  Supportive [place] to Communicate Effectively  Patient Self Care Deficits:  Holding on to guilt/blaming self for others  Patient Goals:  -attend all counseling appointments -consider attending exercise programs again - call to cancel if needed - keep a calendar with appointment dates  Follow Up Plan: Appointment scheduled for SW follow up with client by phone on:  10/31/21      Follow Up Plan: Appointment scheduled for SW follow up with client by phone on: 7/25./23    Eduard Clos MSW, [place] Licensed Clinical Social Worker [place]   [phone]

## 2021-10-17 NOTE — Patient Instructions (Signed)
Visit Information  Thank you for taking time to visit with me today. Please don't hesitate to contact me if I can be of assistance to you before our next scheduled telephone appointment.  Following are the goals we discussed today:  Expect phone call from [place] to schedule your counseling   Our next appointment is by telephone on 10/31/21 at 10  Please call the care guide team at [phone] if you need to cancel or reschedule your appointment.   If you are experiencing a Mental Health or [place] or need someone to talk to, please call the [place] National Suicide Prevention Lifeline: (702)460-5305 or TTY: 5394455542 TTY 530-768-8493) to talk to a trained counselor call 911   Patient verbalizes understanding of instructions and care plan provided today and agrees to view in [place]. Active MyChart status and patient understanding of how to access instructions and care plan via MyChart confirmed with patient.     Eduard Clos MSW, LCSW Licensed Clinical Social Worker [place]    [phone]

## 2021-10-31 ENCOUNTER — Ambulatory Visit: Payer: Medicare HMO | Admitting: *Deleted

## 2021-10-31 DIAGNOSIS — E1140 Type 2 diabetes mellitus with diabetic neuropathy, unspecified: Secondary | ICD-10-CM

## 2021-10-31 DIAGNOSIS — I10 Essential (primary) hypertension: Secondary | ICD-10-CM

## 2021-10-31 DIAGNOSIS — F418 Other specified anxiety disorders: Secondary | ICD-10-CM

## 2021-10-31 DIAGNOSIS — E782 Mixed hyperlipidemia: Secondary | ICD-10-CM

## 2021-10-31 NOTE — Patient Instructions (Signed)
Visit Information  Thank you for taking time to visit with me today. Please don't hesitate to contact me if I can be of assistance to you If you are experiencing a Mental Health or [place] or need someone to talk to, please call the [place] National Suicide Prevention Lifeline: 940 419 9108 or TTY: (872)642-5347 TTY [phone]) to talk to a trained counselor call 911   The patient verbalized understanding of instructions, educational materials, and care plan provided today and DECLINED offer to receive copy of patient instructions, educational materials, and care plan.   Eduard Clos MSW, LCSW Licensed Clinical Social Worker [place]    [phone]

## 2021-10-31 NOTE — Chronic Care Management (AMB) (Signed)
Chronic Care Management    Clinical Social Work Note  10/31/2021 Name: SAMARIYA ROCKHOLD MRN: [id number] DOB: 29-Jun-1945  MARJORY MEINTS is a 76 y.o. year old female who is a primary care patient of Pickard, Cammie Mcgee, MD. The CCM team was consulted to assist the patient with chronic disease management and/or care coordination needs related to: Mental Health Counseling and Resources.   Engaged with patient by telephone for follow up visit in response to provider referral for social work chronic care management and care coordination services.   Consent to Services:  The patient was given information about Chronic Care Management services, agreed to services, and gave verbal consent prior to initiation of services.  Please see initial visit note for detailed documentation.   Patient agreed to services and consent obtained.   Assessment: Review of patient past medical history, allergies, medications, and health status, including review of relevant consultants reports was performed today as part of a comprehensive evaluation and provision of chronic care management and care coordination services.     SDOH (Social Determinants of Health) assessments and interventions performed:    Advanced Directives Status: Not addressed in this encounter.  CCM Care Plan  No Known Allergies  Outpatient Encounter Medications as of 10/31/2021  Medication Sig   Accu-Chek Softclix Lancets lancets USE TO CHECK CBG ONCE A DAY   atorvastatin (LIPITOR) 40 MG tablet Take 1 tablet (40 mg total) by mouth daily.   B Complex Vitamins (B COMPLEX PO) Take by mouth as directed.   blood glucose meter kit and supplies Dispense based on patient and insurance preference. Check CBG once a day (FOR ICD-10 E10.9, E11.9).   esomeprazole (NEXIUM) 40 MG capsule Take 1 capsule by mouth once daily   famotidine (PEPCID) 40 MG tablet Take 1 tablet (40 mg total) by mouth daily.   fluconazole (DIFLUCAN) 150 MG tablet Take 1 tablet (150 mg  total) by mouth once a week for 26 doses.   glucose blood (ACCU-CHEK GUIDE) test strip USE TO CHECK CBG ONCE A DAY   hydrochlorothiazide (HYDRODIURIL) 25 MG tablet Take 0.5 tablets (12.5 mg total) by mouth daily. Pt reports she takes half of 25 mg tablet   ibuprofen (ADVIL) 800 MG tablet Take by mouth.   losartan (COZAAR) 100 MG tablet Take 1 tablet (100 mg total) by mouth daily.   Multiple Vitamin (MULTI-VITAMIN) tablet Take by mouth.   Omega-3 Fatty Acids (FISH OIL PO) Take by mouth as directed.   Probiotic Product (PROBIOTIC DAILY PO) Take by mouth.   traZODone (DESYREL) 50 MG tablet Take 0.5-1 tablets (25-50 mg total) by mouth at bedtime as needed for sleep.   vitamin C (ASCORBIC ACID) 500 MG tablet Take 1 tablet (500 mg total) by mouth daily.   ZINC OXIDE PO Take by mouth.   No facility-administered encounter medications on file as of 10/31/2021.    Patient Active Problem List   Diagnosis Date Noted   Abdominal bloating 07/15/2020   Abnormal feces 07/15/2020   Colon cancer screening 07/15/2020   Family history of malignant neoplasm of gastrointestinal tract 07/15/2020   Generalized abdominal pain 07/15/2020   Personal history of colonic polyps 07/15/2020   Rectal bleeding 07/15/2020   HSV (herpes simplex virus) anogenital infection 07/15/2020   Rectal itching 07/15/2020   Bladder prolapse, female, acquired 04/28/2019   Controlled type 2 diabetes mellitus with neuropathy ([place]) 10/27/2018   OA (osteoarthritis) of hip 05/24/2017   DDD (degenerative disc disease), lumbar 05/24/2017  GERD (gastroesophageal reflux disease) 02/10/2016   Essential hypertension 07/26/2015   Hyperlipidemia 07/26/2015   Insomnia secondary to depression with anxiety 07/26/2015   Osteopenia    Palpitations    Colon polyps     Conditions to be addressed/monitored: Depression; Mental Health Concerns   Care Plan : LCSW Plan of Care  Updates made by Deirdre Peer, LCSW since 10/31/2021 12:00 AM      Problem: Symptoms (Depression)      Long-Range Goal: Symptoms Monitored and Managed Completed 10/31/2021  Start Date: 12/21/2020  Expected End Date: 02/05/2022  This Visit's Progress: On track  Recent Progress: On track  Priority: High  Note:   Current Barriers:  Chronic Mental Health needs related to depression /sadness Mental Health Concerns  Suicidal Ideation/Homicidal Ideation: No  Clinical Social Work Goal(s):  patient will work with SW  PRN  by telephone or in person to reduce or manage symptoms related to depression demonstrate a reduction in symptoms related to :Depression, Grief, and Stress at family concerns  explore community resource options for unmet needs related NI:DPOEUMP counseling   Interventions: 10/31/21- Pt reports she has an appointment with [facility] for counseling on next Monday, 11/06/21.  CSW advised pt of plans to sign off and if further CSW needs arise, PCP can re-consult for our assistance.  Pt had a birthday last week- states she enjoyed.   10/17/21- Pt is interested in getting connected with a new counselor- she has decided she wants to continue with this; however her previous counselor ended. CSW will place referral to [facility].   09/05/21- CSW spoke with pt who reports doing much better with her depression and overall. Pt had her last counseling session with her therapist and feels she does not need to continue.  CSW completed depression screening with pt today; scoring "7" and better/lower than previously.     09/05/2021   10:12 AM 07/25/2021    2:05 PM 05/16/2021    2:02 PM 05/01/2021   10:49 AM 03/20/2021    8:43 AM  Depression screen PHQ 2/9  Decreased Interest 0 1 1 2 1   Down, Depressed, Hopeless 1 1 1 1 1   PHQ - 2 Score 1 2 2 3 2   Altered sleeping 1 1 1 1 2   Tired, decreased energy 1 2 2 2 1   Change in appetite 1 1 1 1  0  Feeling bad or failure about yourself  1 1 2 3 1   Trouble concentrating 2 2 1 1 3   Moving slowly or  fidgety/restless 0 1 0 0 1  Suicidal thoughts 0 0 0 0 0  PHQ-9 Score 7 10 9 11 10   Difficult doing work/chores Somewhat difficult Somewhat difficult  Somewhat difficult     07/25/21- CSW spoke with pt who is pleased Dr Dennard Schaumann was able to accept her as a pt since her husband also goes to him. Pt shared she saw PCP and he started her on some new RX to help with her reflux.  CSW completed depression screening with pt today- she remains about the same as Feb and in the mild/mod category. Pt is active; going to the [facility] for yoga, pilates, etc- also "learning some computer tech information".  Pt continues with counseling and admits to worry about family members. Talked with pt about her worry/anxiety and the relation this may also have on her reflux. Encouraged pt to talk with her counselor about the anxiety/worry also.   06/27/21-  CSW spoke with pt  today who is complaining of "reflux, bubbling in my stomach".  CSW offered to make RNCM aware and also suggested she reach out to PCP office- she reports she is still trying to get connected with a new provider but plans to call [facility] and see if they are able to keep her and reassign her to a new PCP. Also, she plans to make them aware of these current issues.  Pt continues with her counseling support as well as going to the gym 3 times weekly for yoga, aerobics, etc.  CSW will plan to reassess depression and PHQ9 at next visit.  CSW spoke with pt who continues with her virtual counseling appointments. Pt reports this is going well- she feels it is helpful. CSW completed PHQ9 Depression Screening with pt today- pt scoring less/better than prior-  Depression screen [facility] 2/9 05/16/2021 05/01/2021 03/20/2021 01/09/2021 11/28/2020  Decreased Interest 1 2 1 3 3   Down, Depressed, Hopeless 1 1 1 1 2   PHQ - 2 Score 2 3 2 4 5   Altered sleeping 1 1 2 2 2   Tired, decreased energy 2 2 1 1 1   Change in appetite 1 1 0 1 0  Feeling bad or failure about yourself  2 3 1 2   0  Trouble concentrating 1 1 3 1 1   Moving slowly or fidgety/restless 0 0 1 1 0  Suicidal thoughts 0 0 0 0 0  PHQ-9 Score 9 11 10 12 9   Difficult doing work/chores - Somewhat difficult - Somewhat difficult Somewhat difficult  Some recent data might be hidden          Also reminded pt to use the [facility] OTC catalog benefit before the end of this quarter/year.   Patient interviewed and appropriate assessments performed: brief mental health assessment Recent PHQ9 score of "9" 1:1 collaboration with Eulogio Bear, NP regarding development and update of comprehensive plan of care as evidenced by provider attestation and co-signature Patient interviewed and appropriate assessments performed Provided mental health counseling with regard to depression and allowing herself to not hold guilt/blame for other's issues Provided patient with information about depression, treatment options, etc Referred patient to [facility] for long term follow up and therapy/counseling Depression screen reviewed , Active listening / Reflection utilized , Emotional Supportive Provided, Provided brief CBT , Quality of sleep assessed & Sleep Hygiene techniques promoted , Participation in counseling encouraged , and Discussed referral to [facility] to assist with connecting to mental health provider  Patient Self Care Activities:  Self administers medications as prescribed Attends all scheduled provider appointments Attends church or other social activities Calls provider office for new concerns or questions Ability for insight Motivation for treatment  Patient Coping Strengths:  Supportive [place] to Communicate Effectively  Patient Self Care Deficits:  Holding on to guilt/blaming self for others  Patient Goals:  -attend all counseling appointments -consider attending exercise programs again - call to cancel if needed - keep a calendar with  appointment dates  Follow Up Plan: N/A      Follow Up Plan: Client will request PCP to re-consult if CSW needs arise      Eduard Clos MSW, LCSW Licensed Clinical Social Worker [place]    [phone]

## 2021-11-06 DIAGNOSIS — E782 Mixed hyperlipidemia: Secondary | ICD-10-CM | POA: Diagnosis not present

## 2021-11-06 DIAGNOSIS — I10 Essential (primary) hypertension: Secondary | ICD-10-CM | POA: Diagnosis not present

## 2021-11-06 DIAGNOSIS — F331 Major depressive disorder, recurrent, moderate: Secondary | ICD-10-CM | POA: Diagnosis not present

## 2021-11-06 DIAGNOSIS — E1140 Type 2 diabetes mellitus with diabetic neuropathy, unspecified: Secondary | ICD-10-CM | POA: Diagnosis not present

## 2021-11-06 DIAGNOSIS — F418 Other specified anxiety disorders: Secondary | ICD-10-CM

## 2021-11-06 DIAGNOSIS — F411 Generalized anxiety disorder: Secondary | ICD-10-CM | POA: Diagnosis not present

## 2021-11-10 ENCOUNTER — Ambulatory Visit (INDEPENDENT_AMBULATORY_CARE_PROVIDER_SITE_OTHER): Payer: Medicare HMO | Admitting: *Deleted

## 2021-11-10 DIAGNOSIS — E119 Type 2 diabetes mellitus without complications: Secondary | ICD-10-CM

## 2021-11-10 DIAGNOSIS — I10 Essential (primary) hypertension: Secondary | ICD-10-CM

## 2021-11-10 NOTE — Patient Instructions (Signed)
Visit Information  Thank you for taking time to visit with me today. Please don't hesitate to contact me if I can be of assistance to you before our next scheduled telephone appointment.  Following are the goals we discussed today:  Take all medications as prescribed Attend all scheduled provider appointments Perform all self care activities independently  Call provider office for new concerns or questions  Continue walking outdoors with your dog- keep up the good work Be mindful of carbohydrate intake- too much bread, pasta, rice, potatoes at one meal will elevate blood sugar Check your feet daily- call doctor for any cuts, redness, callouses, open areas Avoid/ limit spicy, greasy foods in your diet Check blood pressure at least 3 x per week and record in a log Follow low sodium diet- avoid fast food and salty snacks, read labels Continue your exercise program ( yoga and aerobics ) at [facility]- keep up the good work Case closure today- please discuss with your doctor if you have any future care management needs  Please call the care guide team at [phone] if you need to cancel or reschedule your appointment.   If you are experiencing a Mental Health or [place] or need someone to talk to, please call the Suicide and Crisis Lifeline: 988 call the [place] National Suicide Prevention Lifeline: 563-867-5728 or TTY: 905 739 2120 TTY (807)073-6658) to talk to a trained counselor call 1-800-273-TALK (toll free, 24 hour hotline) go to [facility] [address], [place] [phone]) call 911   Patient verbalizes understanding of instructions and care plan provided today and agrees to view in [place]. Active MyChart status and patient understanding of how to access instructions and care plan via MyChart confirmed with patient.     Jacqlyn Larsen RNC, BSN RN Case Manager [facility] [phone]

## 2021-11-10 NOTE — Chronic Care Management (AMB) (Signed)
Chronic Care Management   CCM RN Visit Note  11/10/2021 Name: Nina Olson MRN: [id number] DOB: October 20, 1945  Subjective: Nina Olson is a 76 y.o. year old female who is a primary care patient of Pickard, Cammie Mcgee, MD. The care management team was consulted for assistance with disease management and care coordination needs.    Engaged with patient by telephone for follow up visit in response to provider referral for case management and/or care coordination services.   Consent to Services:  The patient was given information about Chronic Care Management services, agreed to services, and gave verbal consent prior to initiation of services.  Please see initial visit note for detailed documentation.   Patient agreed to services and verbal consent obtained.   Assessment: Review of patient past medical history, allergies, medications, health status, including review of consultants reports, laboratory and other test data, was performed as part of comprehensive evaluation and provision of chronic care management services.   SDOH (Social Determinants of Health) assessments and interventions performed:    CCM Care Plan  No Known Allergies  Outpatient Encounter Medications as of 11/10/2021  Medication Sig   Accu-Chek Softclix Lancets lancets USE TO CHECK CBG ONCE A DAY   atorvastatin (LIPITOR) 40 MG tablet Take 1 tablet (40 mg total) by mouth daily.   B Complex Vitamins (B COMPLEX PO) Take by mouth as directed.   blood glucose meter kit and supplies Dispense based on patient and insurance preference. Check CBG once a day (FOR ICD-10 E10.9, E11.9).   esomeprazole (NEXIUM) 40 MG capsule Take 1 capsule by mouth once daily   famotidine (PEPCID) 40 MG tablet Take 1 tablet (40 mg total) by mouth daily.   fluconazole (DIFLUCAN) 150 MG tablet Take 1 tablet (150 mg total) by mouth once a week for 26 doses.   glucose blood (ACCU-CHEK GUIDE) test strip USE TO CHECK CBG ONCE A DAY   hydrochlorothiazide  (HYDRODIURIL) 25 MG tablet Take 0.5 tablets (12.5 mg total) by mouth daily. Pt reports she takes half of 25 mg tablet   ibuprofen (ADVIL) 800 MG tablet Take by mouth.   losartan (COZAAR) 100 MG tablet Take 1 tablet (100 mg total) by mouth daily.   Multiple Vitamin (MULTI-VITAMIN) tablet Take by mouth.   Omega-3 Fatty Acids (FISH OIL PO) Take by mouth as directed.   Probiotic Product (PROBIOTIC DAILY PO) Take by mouth.   traZODone (DESYREL) 50 MG tablet Take 0.5-1 tablets (25-50 mg total) by mouth at bedtime as needed for sleep.   vitamin C (ASCORBIC ACID) 500 MG tablet Take 1 tablet (500 mg total) by mouth daily.   ZINC OXIDE PO Take by mouth.   No facility-administered encounter medications on file as of 11/10/2021.    Patient Active Problem List   Diagnosis Date Noted   Abdominal bloating 07/15/2020   Abnormal feces 07/15/2020   Colon cancer screening 07/15/2020   Family history of malignant neoplasm of gastrointestinal tract 07/15/2020   Generalized abdominal pain 07/15/2020   Personal history of colonic polyps 07/15/2020   Rectal bleeding 07/15/2020   HSV (herpes simplex virus) anogenital infection 07/15/2020   Rectal itching 07/15/2020   Bladder prolapse, female, acquired 04/28/2019   Controlled type 2 diabetes mellitus with neuropathy ([place]) 10/27/2018   OA (osteoarthritis) of hip 05/24/2017   DDD (degenerative disc disease), lumbar 05/24/2017   GERD (gastroesophageal reflux disease) 02/10/2016   Essential hypertension 07/26/2015   Hyperlipidemia 07/26/2015   Insomnia secondary to depression with anxiety 07/26/2015  Osteopenia    Palpitations    Colon polyps     Conditions to be addressed/monitored:HTN and DMII  Care Plan : RN Care Manager plan of care  Updates made by Kassie Mends, RN since 11/10/2021 12:00 AM     Problem: No plan of care established for management of chronic disease states (HTN, DM2)   Priority: High     Long-Range Goal: Development of plan of  care for chronic disease management (HTN, DM2)   Start Date: 03/06/2021  Expected End Date: 11/08/2021  Priority: High  Note:   Current Barriers:  Knowledge Deficits related to plan of care for management of HTN and DMII  Knowledge Deficits related to basic Diabetes pathophysiology and self care/management- pt needs reinforcement for ADA/ carbohydrate modified diet, pt does not always eat enough vegetables, drinks sugary drinks at times and reports she would like to do better, pt gets outside and walks her dog daily, is now going to [facility] for aerobics, yoga and pilates. Pt has been checking CBG approximately 2 x per week with readings all in 90's to low 100's range (highest reading 110).  Reports most recent blood pressure reading is within normal limits and checks on occasion. Pt reports she lives with spouse, is independent with all aspects of her care, has episodes of depression at times, is working with counselor and feels this is helping. Pt worked with LCSW for depression management, pt reports she did receive advanced directives packet but has not completed.  Does not adhere to provider recommendations re:  carbohydrate modified diet. Knowledge Deficits related to basic understanding of hypertension pathophysiology and self care management- Reports checks blood pressure on occasion.  Patient reports she has occasional intermittent pain in her abdomen and if continues she will call her doctor and schedule an appointment.  RNCM Clinical Goal(s):  Patient will verbalize understanding of plan for management of HTN and DMII as evidenced by patient report, review of EHR and  through collaboration with RN Care manager, provider, and care team.   Interventions: 1:1 collaboration with primary care provider regarding development and update of comprehensive plan of care as evidenced by provider attestation and co-signature Inter-disciplinary care team collaboration (see longitudinal plan of  care) Evaluation of current treatment plan related to  self management and patient's adherence to plan as established by provider   Diabetes Interventions:  (Status:  Goal on track:  Yes.) Long Term Goal Assessed patient's understanding of A1c goal: <7% Reviewed medications with patient and discussed importance of medication adherence Review of patient status, including review of consultants reports, relevant laboratory and other test results, and medications completed Reviewed carbohydrate modified diet, portion control, eliminating sugary soft drinks Reviewed CBG log with patient Reinforced importance of taking diflucan as prescribed Pain assessment completed Continue working with counselor for depression management Reviewed plan of care with patient including case closure today Lab Results  Component Value Date   HGBA1C 6.9 (H) 11/07/2020   Hypertension Interventions:  (Status:  Goal on track:  Yes.) Long Term Goal Last practice recorded BP readings:  BP Readings from Last 3 Encounters:  12/19/20 118/68  11/07/20 132/66  08/15/20 140/84  Most recent eGFR/CrCl:  Lab Results  Component Value Date   EGFR 62 12/19/2020    No components found for: CRCL  Advised patient, providing education and rationale, to monitor blood pressure daily and record, calling PCP for findings outside established parameters Reinforced low sodium diet and importance of reading food labels, avoiding salty  snacks and fast food Encouraged patient with continuing her exercising outdoors/ walking, aerobics and yoga Reinforced importance of limiting/ avoiding spicy, greasy foods to help with reflux/ bloating Reinforced importance of taking medications as prescribed  Patient Goals/Self-Care Activities: Take all medications as prescribed Attend all scheduled provider appointments Perform all self care activities independently  Call provider office for new concerns or questions  Continue walking outdoors with  your dog- keep up the good work Be mindful of carbohydrate intake- too much bread, pasta, rice, potatoes at one meal will elevate blood sugar Check your feet daily- call doctor for any cuts, redness, callouses, open areas Avoid/ limit spicy, greasy foods in your diet Check blood pressure at least 3 x per week and record in a log Follow low sodium diet- avoid fast food and salty snacks, read labels Continue your exercise program ( yoga and aerobics ) at [facility]- keep up the good work Case closure today- please discuss with your doctor if you have any future care management needs       Plan:No further follow up required: case closure today  Jacqlyn Larsen [facility], BSN RN Case Manager [place] [phone]

## 2021-11-12 ENCOUNTER — Other Ambulatory Visit: Payer: Self-pay | Admitting: Family Medicine

## 2021-11-12 DIAGNOSIS — K219 Gastro-esophageal reflux disease without esophagitis: Secondary | ICD-10-CM

## 2021-11-21 DIAGNOSIS — F331 Major depressive disorder, recurrent, moderate: Secondary | ICD-10-CM | POA: Diagnosis not present

## 2021-11-21 DIAGNOSIS — F411 Generalized anxiety disorder: Secondary | ICD-10-CM | POA: Diagnosis not present

## 2021-12-07 DIAGNOSIS — E119 Type 2 diabetes mellitus without complications: Secondary | ICD-10-CM

## 2021-12-07 DIAGNOSIS — I10 Essential (primary) hypertension: Secondary | ICD-10-CM

## 2021-12-12 NOTE — Progress Notes (Unsigned)
   Subjective:    Patient ID: Nina Olson, female    DOB: 12/06/1945, 76 y.o.   MRN: [id number]  HPI Nina Olson presents today with complaints of *** for ***.   Past Medical History:  Diagnosis Date   Colon polyps    GERD (gastroesophageal reflux disease)    Osteopenia    Palpitations    Past Surgical History:  Procedure Laterality Date   CESAREAN SECTION     Current Outpatient Medications on File Prior to Visit  Medication Sig Dispense Refill   Accu-Chek Softclix Lancets lancets USE TO CHECK CBG ONCE A DAY 100 each 0   atorvastatin (LIPITOR) 40 MG tablet Take 1 tablet (40 mg total) by mouth daily. 90 tablet 1   B Complex Vitamins (B COMPLEX PO) Take by mouth as directed.     blood glucose meter kit and supplies Dispense based on patient and insurance preference. Check CBG once a day (FOR ICD-10 E10.9, E11.9). 1 each 0   esomeprazole (NEXIUM) 40 MG capsule Take 1 capsule by mouth once daily 90 capsule 1   famotidine (PEPCID) 40 MG tablet Take 1 tablet by mouth once daily 30 tablet 3   fluconazole (DIFLUCAN) 150 MG tablet Take 1 tablet (150 mg total) by mouth once a week for 26 doses. 26 tablet 0   glucose blood (ACCU-CHEK GUIDE) test strip USE TO CHECK CBG ONCE A DAY 100 each 6   hydrochlorothiazide (HYDRODIURIL) 25 MG tablet Take 0.5 tablets (12.5 mg total) by mouth daily. Pt reports she takes half of 25 mg tablet 90 tablet 3   ibuprofen (ADVIL) 800 MG tablet Take by mouth.     losartan (COZAAR) 100 MG tablet Take 1 tablet (100 mg total) by mouth daily. 90 tablet 1   Multiple Vitamin (MULTI-VITAMIN) tablet Take by mouth.     Omega-3 Fatty Acids (FISH OIL PO) Take by mouth as directed.     Probiotic Product (PROBIOTIC DAILY PO) Take by mouth.     traZODone (DESYREL) 50 MG tablet Take 0.5-1 tablets (25-50 mg total) by mouth at bedtime as needed for sleep. 90 tablet 1   vitamin C (ASCORBIC ACID) 500 MG tablet Take 1 tablet (500 mg total) by mouth daily.     ZINC OXIDE PO Take by  mouth.     No current facility-administered medications on file prior to visit.   No Known Allergies   Review of Systems     Objective:   Physical Exam        Assessment & Plan:

## 2021-12-13 ENCOUNTER — Ambulatory Visit (INDEPENDENT_AMBULATORY_CARE_PROVIDER_SITE_OTHER): Payer: Medicare HMO | Admitting: Family Medicine

## 2021-12-13 VITALS — BP 120/70 | HR 97 | Temp 98.4°F | Ht 65.0 in | Wt 173.0 lb

## 2021-12-13 DIAGNOSIS — Z23 Encounter for immunization: Secondary | ICD-10-CM

## 2021-12-13 DIAGNOSIS — K219 Gastro-esophageal reflux disease without esophagitis: Secondary | ICD-10-CM

## 2021-12-13 DIAGNOSIS — F339 Major depressive disorder, recurrent, unspecified: Secondary | ICD-10-CM

## 2021-12-13 MED ORDER — SHINGRIX 50 MCG/0.5ML IM SUSR: 0.5000 mL | Freq: Once | INTRAMUSCULAR | 0 refills | Status: AC

## 2021-12-19 DIAGNOSIS — F411 Generalized anxiety disorder: Secondary | ICD-10-CM | POA: Diagnosis not present

## 2021-12-19 DIAGNOSIS — F331 Major depressive disorder, recurrent, moderate: Secondary | ICD-10-CM | POA: Diagnosis not present

## 2021-12-26 LAB — HM DIABETES EYE EXAM

## 2022-01-01 ENCOUNTER — Telehealth: Payer: Self-pay | Admitting: *Deleted

## 2022-01-01 DIAGNOSIS — F331 Major depressive disorder, recurrent, moderate: Secondary | ICD-10-CM | POA: Diagnosis not present

## 2022-01-01 DIAGNOSIS — F411 Generalized anxiety disorder: Secondary | ICD-10-CM | POA: Diagnosis not present

## 2022-01-01 NOTE — Telephone Encounter (Signed)
Patient is needing an appointment for f/u on toenail that was removed, using fluconazole and has completed. Please schedule.

## 2022-01-02 NOTE — Telephone Encounter (Signed)
Patient is aware of upcoming appointment and completed the fluconazole 1 week ago.

## 2022-01-09 ENCOUNTER — Ambulatory Visit: Payer: Medicare HMO | Admitting: Podiatry

## 2022-01-09 DIAGNOSIS — B351 Tinea unguium: Secondary | ICD-10-CM

## 2022-01-09 MED ORDER — FLUCONAZOLE 150 MG PO TABS: 150.0000 mg | ORAL_TABLET | ORAL | 0 refills | Status: DC

## 2022-01-11 ENCOUNTER — Encounter: Payer: Self-pay | Admitting: Family Medicine

## 2022-01-12 ENCOUNTER — Other Ambulatory Visit: Payer: Self-pay | Admitting: Family Medicine

## 2022-01-12 DIAGNOSIS — K219 Gastro-esophageal reflux disease without esophagitis: Secondary | ICD-10-CM

## 2022-01-12 NOTE — Telephone Encounter (Signed)
Requested Prescriptions  Pending Prescriptions Disp Refills  . esomeprazole (NEXIUM) 40 MG capsule [Pharmacy Med Name: Esomeprazole Magnesium 40 MG Oral Capsule Delayed Release] 90 capsule 1    Sig: Take 1 capsule by mouth once daily     Gastroenterology: Proton Pump Inhibitors 2 Passed - 01/12/2022  9:20 AM      Passed - ALT in normal range and within 360 days    ALT  Date Value Ref Range Status  09/18/2021 15 6 - 29 U/L Final         Passed - AST in normal range and within 360 days    AST  Date Value Ref Range Status  09/18/2021 16 10 - 35 U/L Final         Passed - Valid encounter within last 12 months    Recent Outpatient Visits          6 months ago Gastroesophageal reflux disease without esophagitis   [place] Susy Frizzle, MD   8 months ago Insomnia secondary to depression with anxiety   [place] Eulogio Bear, NP   9 months ago Encounter for annual wellness visit (AWV) in Medicare patient   [place] Eulogio Bear, NP   1 year ago HSV (herpes simplex virus) anogenital infection   [place] Eulogio Bear, NP   1 year ago Controlled type 2 diabetes mellitus with neuropathy ([place])   [place] Eulogio Bear, NP

## 2022-01-13 NOTE — Progress Notes (Signed)
  Subjective:  Patient ID: Nina Olson, female    DOB: 11/10/1945,  MRN: [id number]  Chief Complaint  Patient presents with   Ingrown Toenail    Follow up after toenail removal    76 y.o. female presents with the above complaint. History confirmed with patient.  No longer having pain but she notes discoloration of the toe  Objective:  Physical Exam: warm, good capillary refill, no trophic changes or ulcerative lesions, normal DP and PT pulses, normal sensory exam, and brown-yellow discoloration of right hallux for regrowth of nail.     Assessment:   1. Onychomycosis      Plan:  Patient was evaluated and treated and all questions answered.  Discussed regrowth of the nail which appears to be improving quite a bit.  I recommended treating with fluconazole pulsed dosing in order to reduce any mycotic load and help of the nail.  She has no contraindications to this.  We discussed the risk and possible side effects.  She will follow-up in 3 months or sooner if issues  Return in about 3 months (around 04/11/2022) for follow up after nail fungus treatment.

## 2022-01-16 ENCOUNTER — Other Ambulatory Visit: Payer: Self-pay | Admitting: Family Medicine

## 2022-01-16 ENCOUNTER — Telehealth: Payer: Self-pay | Admitting: *Deleted

## 2022-01-16 DIAGNOSIS — E782 Mixed hyperlipidemia: Secondary | ICD-10-CM

## 2022-01-16 MED ORDER — FLUCONAZOLE 150 MG PO TABS: 150.0000 mg | ORAL_TABLET | ORAL | 0 refills | Status: AC

## 2022-01-16 NOTE — Telephone Encounter (Signed)
Patient is calling to ask that the fluconazole be resent to [facility] [place], was only given one pill at [facility] last week, on back order and are still on back order today, is scheduled to take another one on tomorrow. Please advise.

## 2022-01-17 ENCOUNTER — Telehealth: Payer: Self-pay | Admitting: *Deleted

## 2022-01-17 NOTE — Telephone Encounter (Signed)
Patient has been notified that the medication (fluconazole)has been sent to pharmacy.

## 2022-01-25 ENCOUNTER — Encounter: Payer: Self-pay | Admitting: Gastroenterology

## 2022-01-30 DIAGNOSIS — F411 Generalized anxiety disorder: Secondary | ICD-10-CM | POA: Diagnosis not present

## 2022-01-30 DIAGNOSIS — F331 Major depressive disorder, recurrent, moderate: Secondary | ICD-10-CM | POA: Diagnosis not present

## 2022-02-13 DIAGNOSIS — F411 Generalized anxiety disorder: Secondary | ICD-10-CM | POA: Diagnosis not present

## 2022-02-13 DIAGNOSIS — F331 Major depressive disorder, recurrent, moderate: Secondary | ICD-10-CM | POA: Diagnosis not present

## 2022-03-07 ENCOUNTER — Encounter: Payer: Self-pay | Admitting: Neurology

## 2022-03-07 ENCOUNTER — Other Ambulatory Visit: Payer: Self-pay

## 2022-03-07 ENCOUNTER — Ambulatory Visit: Payer: Medicare HMO | Admitting: Podiatry

## 2022-03-07 ENCOUNTER — Other Ambulatory Visit: Payer: Self-pay | Admitting: Podiatry

## 2022-03-07 DIAGNOSIS — G609 Hereditary and idiopathic neuropathy, unspecified: Secondary | ICD-10-CM

## 2022-03-07 DIAGNOSIS — M5431 Sciatica, right side: Secondary | ICD-10-CM | POA: Diagnosis not present

## 2022-03-07 DIAGNOSIS — M5432 Sciatica, left side: Secondary | ICD-10-CM

## 2022-03-07 NOTE — Progress Notes (Signed)
  Subjective:  Patient ID: Nina Olson, female    DOB: 1946/03/11,  MRN: [id number]  Chief Complaint  Patient presents with   Tendonitis    L foot feels tight in ball of foot    76 y.o. female presents with the above complaint. History confirmed with patient.  She has had worsening discomfort in both feet the left is worse it has a very tight feeling like it is causing pressure inside there is occasional tingling and burning as well but usually just a numb feeling  Objective:  Physical Exam: warm, good capillary refill, no trophic changes or ulcerative lesions, normal DP and PT pulses, and she is an abnormal sensory exam with loss of protective sensation and inconsistent monofilament exam.  Assessment:   1. Idiopathic neuropathy   2. Bilateral sciatica      Plan:  Patient was evaluated and treated and all questions answered.  Suspect she has idiopathic neuropathy I recommended checking her B12 and folate which she does not recall if this has been checked recently.  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 March Rummage did injections for.  She has no clinical evidence of this today and I do not think that a new MRI is indicated in the foot.  She does report some back pain and shooting pain down the leg and I think sciatica lumbosacral to colopathy is probably a factor here.  I would like to refer her to neurology for EMG/NCV and evaluation of this and follow-up on the neuropathic symptoms as well as a neurosurgery referral for evaluation of the back which she says is getting worse.  If no high or systemic etiology is found then new MRIs of the forefoot may be indicated  No follow-ups on file.

## 2022-03-08 LAB — FOLATE: Folate: 17.7 ng/mL (ref >3.0)

## 2022-03-08 LAB — VITAMIN B12: Vitamin B-12: 822 pg/mL (ref 232–1245)

## 2022-03-09 ENCOUNTER — Ambulatory Visit: Payer: Medicare HMO | Admitting: Gastroenterology

## 2022-03-09 ENCOUNTER — Encounter: Payer: Self-pay | Admitting: Gastroenterology

## 2022-03-09 VITALS — BP 100/80 | HR 80 | Ht 65.6 in | Wt 170.5 lb

## 2022-03-09 DIAGNOSIS — R12 Heartburn: Secondary | ICD-10-CM | POA: Diagnosis not present

## 2022-03-09 DIAGNOSIS — R140 Abdominal distension (gaseous): Secondary | ICD-10-CM | POA: Diagnosis not present

## 2022-03-09 DIAGNOSIS — R1319 Other dysphagia: Secondary | ICD-10-CM

## 2022-03-09 DIAGNOSIS — R1110 Vomiting, unspecified: Secondary | ICD-10-CM | POA: Diagnosis not present

## 2022-03-09 DIAGNOSIS — K219 Gastro-esophageal reflux disease without esophagitis: Secondary | ICD-10-CM

## 2022-03-09 DIAGNOSIS — Z86010 Personal history of colonic polyps: Secondary | ICD-10-CM | POA: Diagnosis not present

## 2022-03-09 DIAGNOSIS — R1013 Epigastric pain: Secondary | ICD-10-CM

## 2022-03-09 NOTE — Patient Instructions (Signed)
You have been scheduled for an endoscopy. Please follow written instructions given to you at your visit today. If you use inhalers (even only as needed), please bring them with you on the day of your procedure.  _______________________________________________________  If you are age 75 or older, your body mass index should be between 23-30. Your Body mass index is 27.86 kg/m. If this is out of the aforementioned range listed, please consider follow up with your Primary Care Provider.  If you are age 26 or younger, your body mass index should be between 19-25. Your Body mass index is 27.86 kg/m. If this is out of the aformentioned range listed, please consider follow up with your Primary Care Provider.   ________________________________________________________  The [facility] providers would like to encourage you to use [facility] to communicate with providers for non-urgent requests or questions.  Due to long hold times on the telephone, sending your provider a message by [facility] may be a faster and more efficient way to get a response.  Please allow 48 business hours for a response.  Please remember that this is for non-urgent requests.  _______________________________________________________

## 2022-03-09 NOTE — Progress Notes (Signed)
Chief Complaint: GERD, dysphagia, belching, bloating   Referring Provider:     Rubie Maid, FNP   HPI:     Nina Olson is a 76 y.o. female with a history of diabetes with neuropathy, HTN, HLD, bladder prolapse, OA, DDD, anxiety/depression, referred to the Gastroenterology Clinic for evaluation of GERD, dysphagia, belching, bloating.  Has been taking Nexium 40 mg/day for a few years (30 mins prior to breakfast), and more recently started famotidine in the last 6 months or so. Takes 40 mg immediately prior to breakfast with improvement, but not resolution of GERD sxs. Index sxs of HB, regurgitation. Increased nocturnal regurgitation over last 6 months or so. More recently also with belching. Occasional pill dysphagia, but also can have dysphagia to liquids alone at times. No hx of food impactions. No prior EGD.   Very occasional episodes of epigastric discomfort and bloating, typically lasting 1-2 days and resolves without intervention.  No nausea/vomiting.  No hematochezia, melena, diarrhea, constipation.   Endoscopic History: - Colonoscopy (03/10/2007, Dr. Collene Mares): No report available for review - Colonoscopy (05/17/2014, Dr. Collene Mares): Diminutive cecal polyp (path: TA), 15 mm pedunculated polyp in the proximal descending colon at 60 cm removed with hot snare and epinephrine (path: TVA), Sigmoid diverticulosis.  Normal TI - Colonoscopy (02/04/2019, Dr. Collene Mares): Pandiverticulosis, 2 small polyps in the mid ascending and cecum (path: Adenomas).  Normal TI.  Recommended repeat in 5 years      Latest Ref Rng & Units 09/18/2021    8:17 AM 03/20/2021    9:07 AM 11/07/2020   12:27 PM  CBC  WBC 3.8 - 10.8 Thousand/uL 4.4  4.8  4.9   Hemoglobin 11.7 - 15.5 g/dL 13.0  13.5  12.5   Hematocrit 35.0 - 45.0 % 40.0  41.0  37.7   Platelets 140 - 400 Thousand/uL 292  294  276       Latest Ref Rng & Units 09/18/2021    8:17 AM 03/20/2021    9:07 AM 12/19/2020   12:00 PM  CMP  Glucose 65 -  99 mg/dL 104  96  97   BUN 7 - 25 mg/dL _0 Creatinine 0.60 - 1.00 mg/dL 0.94  0.90  0.96   Sodium 135 - 146 mmol/L 143  144  143   Potassium 3.5 - 5.3 mmol/L 4.0  4.5  3.7   Chloride 98 - 110 mmol/L 106  105  104   CO2 20 - 32 mmol/L _1 Calcium 8.6 - 10.4 mg/dL 9.4  9.5  10.1   Total Protein 6.1 - 8.1 g/dL 6.9  7.0    Total Bilirubin 0.2 - 1.2 mg/dL 0.4  0.4    AST 10 - 35 U/L 16  19    ALT 6 - 29 U/L 15  23       Past Medical History:  Diagnosis Date   Anemia    Arthritis    Colon polyps    GERD (gastroesophageal reflux disease)    High cholesterol    Hypertension    Osteopenia    Palpitations      Past Surgical History:  Procedure Laterality Date   CESAREAN SECTION     Family History  Problem Relation Age of Onset   Hypertension Mother    Osteoporosis Mother    Social History   Tobacco Use   Smoking  status: Former   Smokeless tobacco: Former    Quit date: 04/09/1978  Vaping Use   Vaping Use: Never used  Substance Use Topics   Alcohol use: No   Drug use: No   Current Outpatient Medications  Medication Sig Dispense Refill   Accu-Chek Softclix Lancets lancets USE TO CHECK CBG ONCE A DAY 100 each 0   atorvastatin (LIPITOR) 40 MG tablet Take 1 tablet by mouth once daily 90 tablet 0   B Complex Vitamins (B COMPLEX PO) Take by mouth as directed.     blood glucose meter kit and supplies Dispense based on patient and insurance preference. Check CBG once a day (FOR ICD-10 E10.9, E11.9). 1 each 0   esomeprazole (NEXIUM) 40 MG capsule Take 1 capsule by mouth once daily 90 capsule 1   famotidine (PEPCID) 40 MG tablet Take 1 tablet by mouth once daily 30 tablet 3   fluconazole (DIFLUCAN) 150 MG tablet Take 1 tablet (150 mg total) by mouth once a week for 13 doses. 13 tablet 0   FLUoxetine (PROZAC) 20 MG capsule Take 20 mg by mouth daily. Dr. Archie Balboa     glucose blood (ACCU-CHEK GUIDE) test strip USE TO CHECK CBG ONCE A DAY 100 each 6    hydrochlorothiazide (HYDRODIURIL) 25 MG tablet Take 0.5 tablets (12.5 mg total) by mouth daily. Pt reports she takes half of 25 mg tablet 90 tablet 3   ibuprofen (ADVIL) 800 MG tablet Take by mouth.     losartan (COZAAR) 100 MG tablet Take 1 tablet (100 mg total) by mouth daily. 90 tablet 1   Multiple Vitamin (MULTI-VITAMIN) tablet Take by mouth.     Omega-3 Fatty Acids (FISH OIL PO) Take by mouth as directed.     Probiotic Product (PROBIOTIC DAILY PO) Take by mouth.     traZODone (DESYREL) 50 MG tablet Take 0.5-1 tablets (25-50 mg total) by mouth at bedtime as needed for sleep. 90 tablet 1   vitamin C (ASCORBIC ACID) 500 MG tablet Take 1 tablet (500 mg total) by mouth daily.     ZINC OXIDE PO Take by mouth.     No current facility-administered medications for this visit.   No Known Allergies   Review of Systems: All systems reviewed and negative except where noted in HPI.     Physical Exam:    Wt Readings from Last 3 Encounters:  03/09/22 170 lb 8 oz (77.3 kg)  12/13/21 173 lb (78.5 kg)  07/13/21 179 lb 12.8 oz (81.6 kg)    BP 100/80   Pulse 80   Ht 5' 5.6" (1.666 m)   Wt 170 lb 8 oz (77.3 kg)   BMI 27.86 kg/m  Constitutional:  Pleasant, in no acute distress. Psychiatric: Normal mood and affect. Behavior is normal. Cardiovascular: Normal rate, regular rhythm. No edema Pulmonary/chest: Effort normal and breath sounds normal. No wheezing, rales or rhonchi. Abdominal: Soft, nondistended, nontender. Bowel sounds active throughout. There are no masses palpable. No hepatomegaly. Neurological: Alert and oriented to person place and time. Skin: Skin is warm and dry. No rashes noted.   ASSESSMENT AND PLAN;   1) Dysphagia 2) GERD 3) Heartburn 4) Regurgitation - EGD to evaluate for esophageal stricture, luminal narrowing along with erosive esophagitis, LES laxity, hiatal hernia with esophageal dilation and/or biopsies as appropriate - Continue Nexium and famotidine as  currently prescribed with plan to modify medications based on endoscopic findings - If EGD otherwise unrevealing, plan for modified barium swallow next along with  consideration for Esophageal Manometry and pH/impedance testing - HOB elevation - Avoid eating close to bedtime, avoid overeating  5) Epigastric pain 6) Abdominal bloating - Evaluate for medical/luminal pathology time EGD as above  7) History of colon polyps - Repeat colonoscopy in 2025 for ongoing polyp surveillance  The indications, risks, and benefits of EGD were explained to the patient in detail. Risks include but are not limited to bleeding, perforation, adverse reaction to medications, and cardiopulmonary compromise. Sequelae include but are not limited to the possibility of surgery, hospitalization, and mortality. The patient verbalized understanding and wished to proceed. All questions answered, referred to scheduler. Further recommendations pending results of the exam.     Lavena Bullion, DO, FACG  03/09/2022, 9:32 AM   Rubie Maid, FNP

## 2022-03-15 ENCOUNTER — Encounter: Payer: Self-pay | Admitting: Gastroenterology

## 2022-03-15 ENCOUNTER — Ambulatory Visit (AMBULATORY_SURGERY_CENTER): Payer: Medicare HMO | Admitting: Gastroenterology

## 2022-03-15 VITALS — BP 132/69 | HR 75 | Temp 96.8°F | Resp 20 | Ht 65.6 in | Wt 170.0 lb

## 2022-03-15 DIAGNOSIS — I10 Essential (primary) hypertension: Secondary | ICD-10-CM | POA: Diagnosis not present

## 2022-03-15 DIAGNOSIS — R1013 Epigastric pain: Secondary | ICD-10-CM | POA: Diagnosis not present

## 2022-03-15 DIAGNOSIS — K317 Polyp of stomach and duodenum: Secondary | ICD-10-CM

## 2022-03-15 DIAGNOSIS — R1110 Vomiting, unspecified: Secondary | ICD-10-CM

## 2022-03-15 DIAGNOSIS — K219 Gastro-esophageal reflux disease without esophagitis: Secondary | ICD-10-CM

## 2022-03-15 DIAGNOSIS — K222 Esophageal obstruction: Secondary | ICD-10-CM | POA: Diagnosis not present

## 2022-03-15 DIAGNOSIS — R140 Abdominal distension (gaseous): Secondary | ICD-10-CM

## 2022-03-15 DIAGNOSIS — R1319 Other dysphagia: Secondary | ICD-10-CM

## 2022-03-15 DIAGNOSIS — R12 Heartburn: Secondary | ICD-10-CM

## 2022-03-15 DIAGNOSIS — K449 Diaphragmatic hernia without obstruction or gangrene: Secondary | ICD-10-CM

## 2022-03-15 MED ORDER — SODIUM CHLORIDE 0.9 % IV SOLN: 500.0000 mL | Freq: Once | INTRAVENOUS | Status: DC

## 2022-03-15 NOTE — Op Note (Signed)
[facility] Patient Name: Nina Olson Procedure Date: 03/15/2022 9:32 AM MRN: [id number] Endoscopist: Gerrit Heck , MD, [id number] Age: 76 Referring MD:  Date of Birth: 1945-06-29 Gender: Female Account #: [id number] Procedure:                Upper GI endoscopy Indications:              Epigastric abdominal pain, Dysphagia, Heartburn,                            Suspected esophageal reflux, Abdominal bloating,                            Eructation, Regurgitation Medicines:                Monitored Anesthesia Care Procedure:                Pre-Anesthesia Assessment:                           - Prior to the procedure, a History and Physical                            was performed, and patient medications and                            allergies were reviewed. The patient's tolerance of                            previous anesthesia was also reviewed. The risks                            and benefits of the procedure and the sedation                            options and risks were discussed with the patient.                            All questions were answered, and informed consent                            was obtained. Prior Anticoagulants: The patient has                            taken no anticoagulant or antiplatelet agents. ASA                            Grade Assessment: II - A patient with mild systemic                            disease. After reviewing the risks and benefits,                            the patient was deemed in satisfactory condition to  undergo the procedure.                           After obtaining informed consent, the endoscope was                            passed under direct vision. Throughout the                            procedure, the patient's blood pressure, pulse, and                            oxygen saturations were monitored continuously. The                            Endoscope was introduced  through the mouth, and                            advanced to the second part of duodenum. The upper                            GI endoscopy was accomplished without difficulty.                            The patient tolerated the procedure well. Scope In: Scope Out: Findings:                 A non-obstructing and mild Schatzki ring was found                            in the lower third of the esophagus. A guidewire                            was placed and the scope was withdrawn. Dilation                            was performed with a Savary dilator with no                            resistance at 16 mm and mild resistance at 18 mm.                            The dilation site was examined following endoscope                            reinsertion and showed no bleeding, mucosal tear or                            perforation. Biopsies were then taken with a cold                            forceps for dual purpose of further fracturing of  the ring and sent for histology, EoE evaluation.                            Estimated blood loss was minimal.                           The upper third of the esophagus and middle third                            of the esophagus were normal. Biopsies were                            obtained from the proximal with cold forceps for                            histology of suspected eosinophilic esophagitis.                           A 1 cm hiatal hernia was present.                           Multiple small sessile polyps with no bleeding and                            no stigmata of recent bleeding were found in the                            gastric fundus and in the gastric body. Several of                            these polyps were removed with a cold biopsy                            forceps. Resection and retrieval were complete.                            Estimated blood loss was minimal.                            Normal mucosa was found in the entire examined                            stomach.                           The examined duodenum was normal. Biopsies were                            taken with a cold forceps for histology. Estimated                            blood loss was minimal. Complications:            No immediate complications. Estimated Blood Loss:     Estimated  blood loss was minimal. Impression:               - Non-obstructing and mild Schatzki ring. Dilated                            with 16 and 18 mm Savary then fractured with                            forceps and biopsied.                           - Normal upper third of esophagus and middle third                            of esophagus.                           - 1 cm hiatal hernia.                           - Multiple gastric polyps. Resected and retrieved.                           - Normal mucosa was found in the entire stomach.                           - Normal examined duodenum. Biopsied.                           - Biopsies were taken with a cold forceps for                            evaluation of eosinophilic esophagitis. Recommendation:           - Patient has a contact number available for                            emergencies. The signs and symptoms of potential                            delayed complications were discussed with the                            patient. Return to normal activities tomorrow.                            Written discharge instructions were provided to the                            patient.                           - Soft diet today then advance as tolerated                            tomorrow per post dilation protocol.                           -  Continue present medications.                           - Await pathology results.                           - Repeat upper endoscopy PRN for retreatment.                           - Return to GI clinic at appointment to be                             scheduled. Gerrit Heck, MD 03/15/2022 10:11:33 AM

## 2022-03-15 NOTE — Progress Notes (Signed)
Called to room to assist during endoscopic procedure.  Patient ID and intended procedure confirmed with present staff. Received instructions for my participation in the procedure from the performing physician.  

## 2022-03-15 NOTE — Progress Notes (Signed)
Sedate, gd SR, tolerated procedure well, VSS, report to RN 

## 2022-03-15 NOTE — Patient Instructions (Signed)
Please read handouts provided. Continue present medications. Await pathology results. Soft diet today and advanced as tolerated tomorrow. Repeat upper endoscopy as needed for treatment.  YOU HAD AN ENDOSCOPIC PROCEDURE TODAY AT [facility]:   Refer to the procedure report that was given to you for any specific questions about what was found during the examination.  If the procedure report does not answer your questions, please call your gastroenterologist to clarify.  If you requested that your care partner not be given the details of your procedure findings, then the procedure report has been included in a sealed envelope for you to review at your convenience later.  YOU SHOULD EXPECT: Some feelings of bloating in the abdomen. Passage of more gas than usual.  Walking can help get rid of the air that was put into your GI tract during the procedure and reduce the bloating. If you had a lower endoscopy (such as a colonoscopy or flexible sigmoidoscopy) you may notice spotting of blood in your stool or on the toilet paper. If you underwent a bowel prep for your procedure, you may not have a normal bowel movement for a few days.  Please Note:  You might notice some irritation and congestion in your nose or some drainage.  This is from the oxygen used during your procedure.  There is no need for concern and it should clear up in a day or so.  SYMPTOMS TO REPORT IMMEDIATELY:   Following upper endoscopy (EGD)  Vomiting of blood or coffee ground material  New chest pain or pain under the shoulder blades  Painful or persistently difficult swallowing  New shortness of breath  Fever of 100F or higher  Black, tarry-looking stools  For urgent or emergent issues, a gastroenterologist can be reached at any hour by calling [phone]. Do not use MyChart messaging for urgent concerns.    DIET:   Drink plenty of fluids but you should avoid alcoholic beverages for 24 hours.  ACTIVITY:   You should plan to take it easy for the rest of today and you should NOT DRIVE or use heavy machinery until tomorrow (because of the sedation medicines used during the test).    FOLLOW UP: Our staff will call the number listed on your records the next business day following your procedure.  We will call around 7:15- 8:00 am to check on you and address any questions or concerns that you may have regarding the information given to you following your procedure. If we do not reach you, we will leave a message.     If any biopsies were taken you will be contacted by phone or by letter within the next 1-3 weeks.  Please call us at [phone] if you have not heard about the biopsies in 3 weeks.    SIGNATURES/CONFIDENTIALITY: You and/or your care partner have signed paperwork which will be entered into your electronic medical record.  These signatures attest to the fact that that the information above on your After Visit Summary has been reviewed and is understood.  Full responsibility of the confidentiality of this discharge information lies with you and/or your care-partner.

## 2022-03-15 NOTE — Progress Notes (Signed)
VS completed by CW.   Pt's states no medical or surgical changes since previsit or office visit.  

## 2022-03-15 NOTE — Progress Notes (Signed)
GASTROENTEROLOGY PROCEDURE H&P NOTE   Primary Care Physician: Susy Frizzle, MD    Reason for Procedure:  GERD, dysphagia, belching, bloating, nocturnal regurgitation, epigastric pain  Plan:    EGD with dilation and/or biopsies  Patient is appropriate for endoscopic procedure(s) in the ambulatory ([facility]) setting.  The nature of the procedure, as well as the risks, benefits, and alternatives were carefully and thoroughly reviewed with the patient. Ample time for discussion and questions allowed. The patient understood, was satisfied, and agreed to proceed.     HPI: Nina Olson is a 76 y.o. female who presents for EGD for evaluation of GERD, dysphagia, belching, bloating, nocturnal regurgitation, epigastric pain.  Was seen in the office by me on 03/09/2022.  Please see note from that day for additional details.  Past Medical History:  Diagnosis Date   Anemia    Arthritis    Colon polyps    GERD (gastroesophageal reflux disease)    High cholesterol    Hypertension    Osteopenia    Palpitations     Past Surgical History:  Procedure Laterality Date   CESAREAN SECTION      Prior to Admission medications   Medication Sig Start Date End Date Taking? Authorizing Provider  Accu-Chek Softclix Lancets lancets USE TO CHECK CBG ONCE A DAY 08/17/19   Mullica Hill, Modena Nunnery, MD  atorvastatin (LIPITOR) 40 MG tablet Take 1 tablet by mouth once daily 01/16/22   Susy Frizzle, MD  B Complex Vitamins (B COMPLEX PO) Take by mouth as directed.    [provider]  blood glucose meter kit and supplies Dispense based on patient and insurance preference. Check CBG once a day (FOR ICD-10 E10.9, E11.9). 04/28/19   Alycia Rossetti, MD  esomeprazole (NEXIUM) 40 MG capsule Take 1 capsule by mouth once daily 01/12/22   Susy Frizzle, MD  famotidine (PEPCID) 40 MG tablet Take 1 tablet by mouth once daily 11/13/21   Susy Frizzle, MD  fluconazole (DIFLUCAN) 150 MG tablet Take 1 tablet  (150 mg total) by mouth once a week for 13 doses. 01/16/22 04/11/22  McDonald, Stephan Minister, DPM  FLUoxetine (PROZAC) 20 MG capsule Take 20 mg by mouth daily. Dr. Archie Balboa    [provider]  glucose blood (ACCU-CHEK GUIDE) test strip USE TO CHECK CBG ONCE A DAY 02/22/21   Eulogio Bear, NP  hydrochlorothiazide (HYDRODIURIL) 25 MG tablet Take 0.5 tablets (12.5 mg total) by mouth daily. Pt reports she takes half of 25 mg tablet 04/11/21   Susy Frizzle, MD  ibuprofen (ADVIL) 800 MG tablet Take by mouth. 09/08/20   [provider]  losartan (COZAAR) 100 MG tablet Take 1 tablet (100 mg total) by mouth daily. 08/07/21   Susy Frizzle, MD  Multiple Vitamin (MULTI-VITAMIN) tablet Take by mouth.    [provider]  Omega-3 Fatty Acids (FISH OIL PO) Take by mouth as directed.    [provider]  Probiotic Product (PROBIOTIC DAILY PO) Take by mouth.    [provider]  traZODone (DESYREL) 50 MG tablet Take 0.5-1 tablets (25-50 mg total) by mouth at bedtime as needed for sleep. 05/01/21   Eulogio Bear, NP  vitamin C (ASCORBIC ACID) 500 MG tablet Take 1 tablet (500 mg total) by mouth daily. 07/29/15   Alycia Rossetti, MD  ZINC OXIDE PO Take by mouth.    [provider]    Current Outpatient Medications  Medication Sig Dispense  Refill   Accu-Chek Softclix Lancets lancets USE TO CHECK CBG ONCE A DAY 100 each 0   atorvastatin (LIPITOR) 40 MG tablet Take 1 tablet by mouth once daily 90 tablet 0   B Complex Vitamins (B COMPLEX PO) Take by mouth as directed.     blood glucose meter kit and supplies Dispense based on patient and insurance preference. Check CBG once a day (FOR ICD-10 E10.9, E11.9). 1 each 0   esomeprazole (NEXIUM) 40 MG capsule Take 1 capsule by mouth once daily 90 capsule 1   famotidine (PEPCID) 40 MG tablet Take 1 tablet by mouth once daily 30 tablet 3   fluconazole (DIFLUCAN) 150 MG tablet Take 1 tablet (150 mg total) by mouth once a  week for 13 doses. 13 tablet 0   FLUoxetine (PROZAC) 20 MG capsule Take 20 mg by mouth daily. Dr. Archie Balboa     glucose blood (ACCU-CHEK GUIDE) test strip USE TO CHECK CBG ONCE A DAY 100 each 6   hydrochlorothiazide (HYDRODIURIL) 25 MG tablet Take 0.5 tablets (12.5 mg total) by mouth daily. Pt reports she takes half of 25 mg tablet 90 tablet 3   ibuprofen (ADVIL) 800 MG tablet Take by mouth.     losartan (COZAAR) 100 MG tablet Take 1 tablet (100 mg total) by mouth daily. 90 tablet 1   Multiple Vitamin (MULTI-VITAMIN) tablet Take by mouth.     Omega-3 Fatty Acids (FISH OIL PO) Take by mouth as directed.     Probiotic Product (PROBIOTIC DAILY PO) Take by mouth.     traZODone (DESYREL) 50 MG tablet Take 0.5-1 tablets (25-50 mg total) by mouth at bedtime as needed for sleep. 90 tablet 1   vitamin C (ASCORBIC ACID) 500 MG tablet Take 1 tablet (500 mg total) by mouth daily.     ZINC OXIDE PO Take by mouth.     Current Facility-Administered Medications  Medication Dose Route Frequency Provider Last Rate Last Admin   0.9 %  sodium chloride infusion  500 mL Intravenous Once Yulianna Folse V, DO        Allergies as of 03/15/2022   (No Known Allergies)    Family History  Problem Relation Age of Onset   Hypertension Mother    Osteoporosis Mother     Social History   Socioeconomic History   Marital status: Married    Spouse name: Not on file   Number of children: 3   Years of education: Not on file   Highest education level: Not on file  Occupational History   Not on file  Tobacco Use   Smoking status: Former   Smokeless tobacco: Former    Quit date: 04/09/1978  Vaping Use   Vaping Use: Never used  Substance and Sexual Activity   Alcohol use: No   Drug use: No   Sexual activity: Never  Other Topics Concern   Not on file  Social History Narrative   Not on file   Social Determinants of Health   Financial Resource Strain: Not on file  Food Insecurity: No Food Insecurity  (11/28/2020)   Hunger Vital Sign    Worried About Running Out of Food in the Last Year: Never true    [place] in the Last Year: Never true  Transportation Needs: No Transportation Needs (11/28/2020)   PRAPARE - Transportation    Lack of Transportation (Medical): No    Lack of Transportation (Non-Medical): No  Physical Activity: Not on file  Stress: Not  on file  Social Connections: Unknown (09/05/2021)   Social Connection and Isolation Panel [NHANES]    Frequency of Communication with Friends and Family: More than three times a week    Frequency of Social Gatherings with Friends and Family: Twice a week    Attends Religious Services: Not on Advertising copywriter or Organizations: Yes    Attends Music therapist: More than 4 times per year    Marital Status: Not on file  Intimate Partner Violence: Not on file    Physical Exam: Vital signs in last 24 hours: _0  130/79   Pulse 86   Temp (!) 96.8 F (36 C) (Temporal)   Ht 5' 5.6" (1.666 m)   Wt 170 lb (77.1 kg)   SpO2 98%   BMI 27.77 kg/m  GEN: NAD EYE: Sclerae anicteric ENT: MMM CV: Non-tachycardic Pulm: CTA b/l GI: Soft, NT/ND NEURO:  Alert & Oriented x 3   Gerrit Heck, DO [facility]   03/15/2022 9:26 AM

## 2022-03-16 ENCOUNTER — Telehealth: Payer: Self-pay

## 2022-03-16 NOTE — Telephone Encounter (Signed)
  Follow up Call-     03/15/2022    9:26 AM  Call back number  Post procedure Call Back phone  # [phone]  Permission to leave phone message Yes     Patient questions:  Do you have a fever, pain , or abdominal swelling? No. Pain Score  0 *  Have you tolerated food without any problems? Yes.    Have you been able to return to your normal activities? Yes.    Do you have any questions about your discharge instructions: Diet   No. Medications  No. Follow up visit  No.  Do you have questions or concerns about your Care? No.  Actions: * If pain score is 4 or above: No action needed, pain <4.

## 2022-03-19 ENCOUNTER — Encounter: Payer: Self-pay | Admitting: Gastroenterology

## 2022-04-12 ENCOUNTER — Ambulatory Visit: Payer: Medicare HMO | Admitting: Podiatry

## 2022-04-14 ENCOUNTER — Other Ambulatory Visit: Payer: Self-pay | Admitting: Family Medicine

## 2022-04-14 DIAGNOSIS — K219 Gastro-esophageal reflux disease without esophagitis: Secondary | ICD-10-CM

## 2022-04-16 NOTE — Patient Instructions (Signed)
[name] , Thank you for taking time to come for your Medicare Wellness Visit. I appreciate your ongoing commitment to your health goals. Please review the following plan we discussed and let me know if I can assist you in the future.   These are the goals we discussed:  Goals      Manage My Emotions     Timeframe:  Short-Term Goal Priority:  High Start Date:      12/21/20                       Expected End Date:               01/19/22      Follow Up Date 09/05/21    - check out volunteer opportunities - perform a random act of kindness - start or continue a personal journal - talk about feelings with a friend, family or spiritual advisor - practice positive thinking and self-talk    Why is this important?   When you are stressed, down or upset, your body reacts too.  For example, your blood pressure may get higher; you may have a headache or stomachache.  When your emotions get the best of you, your body's ability to fight off cold and flu gets weak.  These steps will help you manage your emotions.     Notes:         This is a list of the screening recommended for you and due dates:  Health Maintenance  Topic Date Due   Zoster (Shingles) Vaccine (1 of 2) Never done   Yearly kidney health urinalysis for diabetes  04/27/2020   Flu Shot  11/07/2021   COVID-19 Vaccine (4 - 2023-24 season) 12/08/2021   Complete foot exam   12/19/2021   Colon Cancer Screening  02/03/2022   Medicare Annual Wellness Visit  03/20/2022   Hemoglobin A1C  03/20/2022   Yearly kidney function blood test for diabetes  09/19/2022   Eye exam for diabetics  12/27/2022   DTaP/Tdap/Td vaccine (2 - Td or Tdap) 05/26/2024   Pneumonia Vaccine  Completed   DEXA scan (bone density measurement)  Completed   Hepatitis C Screening: USPSTF Recommendation to screen - Ages 37-79 yo.  Completed   HPV Vaccine  Aged Out    Advanced directives: Advance directive discussed with you today. I have provided a copy for  you to complete at home and have notarized. Once this is complete please bring a copy in to our office so we can scan it into your chart.   Conditions/risks identified: Aim for 30 minutes of exercise or brisk walking, 6-8 glasses of water, and 5 servings of fruits and vegetables each day.   Next appointment: Follow up in one year for your annual wellness visit    Preventive Care 65 Years and Older, Female Preventive care refers to lifestyle choices and visits with your health care provider that can promote health and wellness. What does preventive care include? A yearly physical exam. This is also called an annual well check. Dental exams once or twice a year. Routine eye exams. Ask your health care provider how often you should have your eyes checked. Personal lifestyle choices, including: Daily care of your teeth and gums. Regular physical activity. Eating a healthy diet. Avoiding tobacco and drug use. Limiting alcohol use. Practicing safe sex. Taking low-dose aspirin every day. Taking vitamin and mineral supplements as recommended by your health care provider. What happens during  an annual well check? The services and screenings done by your health care provider during your annual well check will depend on your age, overall health, lifestyle risk factors, and family history of disease. Counseling  Your health care provider may ask you questions about your: Alcohol use. Tobacco use. Drug use. Emotional well-being. Home and relationship well-being. Sexual activity. Eating habits. History of falls. Memory and ability to understand (cognition). Work and work Statistician. Reproductive health. Screening  You may have the following tests or measurements: Height, weight, and BMI. Blood pressure. Lipid and cholesterol levels. These may be checked every 5 years, or more frequently if you are over 4 years old. Skin check. Lung cancer screening. You may have this screening every  year starting at age 65 if you have a 30-pack-year history of smoking and currently smoke or have quit within the past 15 years. Fecal occult blood test (FOBT) of the stool. You may have this test every year starting at age 24. Flexible sigmoidoscopy or colonoscopy. You may have a sigmoidoscopy every 5 years or a colonoscopy every 10 years starting at age 53. Hepatitis C blood test. Hepatitis B blood test. Sexually transmitted disease (STD) testing. Diabetes screening. This is done by checking your blood sugar (glucose) after you have not eaten for a while (fasting). You may have this done every 1-3 years. Bone density scan. This is done to screen for osteoporosis. You may have this done starting at age 22. Mammogram. This may be done every 1-2 years. Talk to your health care provider about how often you should have regular mammograms. Talk with your health care provider about your test results, treatment options, and if necessary, the need for more tests. Vaccines  Your health care provider may recommend certain vaccines, such as: Influenza vaccine. This is recommended every year. Tetanus, diphtheria, and acellular pertussis (Tdap, Td) vaccine. You may need a Td booster every 10 years. Zoster vaccine. You may need this after age 12. Pneumococcal 13-valent conjugate (PCV13) vaccine. One dose is recommended after age 59. Pneumococcal polysaccharide (PPSV23) vaccine. One dose is recommended after age 30. Talk to your health care provider about which screenings and vaccines you need and how often you need them. This information is not intended to replace advice given to you by your health care provider. Make sure you discuss any questions you have with your health care provider. Document Released: 04/22/2015 Document Revised: 12/14/2015 Document Reviewed: 01/25/2015 Elsevier Interactive Patient Education  2017 [place] Prevention in the Home Falls can cause injuries. They can happen  to people of all ages. There are many things you can do to make your home safe and to help prevent falls. What can I do on the outside of my home? Regularly fix the edges of walkways and driveways and fix any cracks. Remove anything that might make you trip as you walk through a door, such as a raised step or threshold. Trim any bushes or trees on the path to your home. Use bright outdoor lighting. Clear any walking paths of anything that might make someone trip, such as rocks or tools. Regularly check to see if handrails are loose or broken. Make sure that both sides of any steps have handrails. Any raised decks and porches should have guardrails on the edges. Have any leaves, snow, or ice cleared regularly. Use sand or salt on walking paths during winter. Clean up any spills in your garage right away. This includes oil or grease spills. What can  I do in the bathroom? Use night lights. Install grab bars by the toilet and in the tub and shower. Do not use towel bars as grab bars. Use non-skid mats or decals in the tub or shower. If you need to sit down in the shower, use a plastic, non-slip stool. Keep the floor dry. Clean up any water that spills on the floor as soon as it happens. Remove soap buildup in the tub or shower regularly. Attach bath mats securely with double-sided non-slip rug tape. Do not have throw rugs and other things on the floor that can make you trip. What can I do in the bedroom? Use night lights. Make sure that you have a light by your bed that is easy to reach. Do not use any sheets or blankets that are too big for your bed. They should not hang down onto the floor. Have a firm chair that has side arms. You can use this for support while you get dressed. Do not have throw rugs and other things on the floor that can make you trip. What can I do in the kitchen? Clean up any spills right away. Avoid walking on wet floors. Keep items that you use a lot in  easy-to-reach places. If you need to reach something above you, use a strong step stool that has a grab bar. Keep electrical cords out of the way. Do not use floor polish or wax that makes floors slippery. If you must use wax, use non-skid floor wax. Do not have throw rugs and other things on the floor that can make you trip. What can I do with my stairs? Do not leave any items on the stairs. Make sure that there are handrails on both sides of the stairs and use them. Fix handrails that are broken or loose. Make sure that handrails are as long as the stairways. Check any carpeting to make sure that it is firmly attached to the stairs. Fix any carpet that is loose or worn. Avoid having throw rugs at the top or bottom of the stairs. If you do have throw rugs, attach them to the floor with carpet tape. Make sure that you have a light switch at the top of the stairs and the bottom of the stairs. If you do not have them, ask someone to add them for you. What else can I do to help prevent falls? Wear shoes that: Do not have high heels. Have rubber bottoms. Are comfortable and fit you well. Are closed at the toe. Do not wear sandals. If you use a stepladder: Make sure that it is fully opened. Do not climb a closed stepladder. Make sure that both sides of the stepladder are locked into place. Ask someone to hold it for you, if possible. Clearly mark and make sure that you can see: Any grab bars or handrails. First and last steps. Where the edge of each step is. Use tools that help you move around (mobility aids) if they are needed. These include: Canes. Walkers. Scooters. Crutches. Turn on the lights when you go into a dark area. Replace any light bulbs as soon as they burn out. Set up your furniture so you have a clear path. Avoid moving your furniture around. If any of your floors are uneven, fix them. If there are any pets around you, be aware of where they are. Review your medicines  with your doctor. Some medicines can make you feel dizzy. This can increase your chance of falling.  Ask your doctor what other things that you can do to help prevent falls. This information is not intended to replace advice given to you by your health care provider. Make sure you discuss any questions you have with your health care provider. Document Released: 01/20/2009 Document Revised: 09/01/2015 Document Reviewed: 04/30/2014 Elsevier Interactive Patient Education  2017 [employer].

## 2022-04-16 NOTE — Progress Notes (Unsigned)
Subjective:   Nina Olson is a 77 y.o. female who presents for Medicare Annual (Subsequent) preventive examination.  Review of Systems    ***       Objective:    There were no vitals filed for this visit. There is no height or weight on file to calculate BMI.     11/28/2020    2:18 PM 11/16/2020    3:37 PM 03/16/2020    9:43 AM 04/15/2018    8:31 AM  Advanced Directives  Does Patient Have a Medical Advance Directive? No No No No  Would patient like information on creating a medical advance directive? Yes (MAU/Ambulatory/Procedural Areas - Information given)  Yes (MAU/Ambulatory/Procedural Areas - Information given) Yes (MAU/Ambulatory/Procedural Areas - Information given)    Current Medications (verified) Outpatient Encounter Medications as of 04/17/2022  Medication Sig   Accu-Chek Softclix Lancets lancets USE TO CHECK CBG ONCE A DAY   atorvastatin (LIPITOR) 40 MG tablet Take 1 tablet by mouth once daily   B Complex Vitamins (B COMPLEX PO) Take by mouth as directed.   blood glucose meter kit and supplies Dispense based on patient and insurance preference. Check CBG once a day (FOR ICD-10 E10.9, E11.9).   esomeprazole (NEXIUM) 40 MG capsule Take 1 capsule by mouth once daily   famotidine (PEPCID) 40 MG tablet Take 1 tablet by mouth once daily   FLUoxetine (PROZAC) 20 MG capsule Take 20 mg by mouth daily. Dr. Archie Balboa   glucose blood (ACCU-CHEK GUIDE) test strip USE TO CHECK CBG ONCE A DAY   hydrochlorothiazide (HYDRODIURIL) 25 MG tablet Take 0.5 tablets (12.5 mg total) by mouth daily. Pt reports she takes half of 25 mg tablet   ibuprofen (ADVIL) 800 MG tablet Take by mouth.   losartan (COZAAR) 100 MG tablet Take 1 tablet (100 mg total) by mouth daily.   Multiple Vitamin (MULTI-VITAMIN) tablet Take by mouth.   Omega-3 Fatty Acids (FISH OIL PO) Take by mouth as directed.   Probiotic Product (PROBIOTIC DAILY PO) Take by mouth.   traZODone (DESYREL) 50 MG tablet Take 0.5-1 tablets  (25-50 mg total) by mouth at bedtime as needed for sleep.   vitamin C (ASCORBIC ACID) 500 MG tablet Take 1 tablet (500 mg total) by mouth daily.   ZINC OXIDE PO Take by mouth.   No facility-administered encounter medications on file as of 04/17/2022.    Allergies (verified) Patient has no known allergies.   History: Past Medical History:  Diagnosis Date   Anemia    Arthritis    Colon polyps    GERD (gastroesophageal reflux disease)    High cholesterol    Hypertension    Osteopenia    Palpitations    Past Surgical History:  Procedure Laterality Date   CESAREAN SECTION     Family History  Problem Relation Age of Onset   Hypertension Mother    Osteoporosis Mother    Social History   Socioeconomic History   Marital status: Married    Spouse name: Not on file   Number of children: 3   Years of education: Not on file   Highest education level: Not on file  Occupational History   Not on file  Tobacco Use   Smoking status: Former   Smokeless tobacco: Former    Quit date: 04/09/1978  Vaping Use   Vaping Use: Never used  Substance and Sexual Activity   Alcohol use: No   Drug use: No   Sexual activity: Never  Other Topics Concern   Not on file  Social History Narrative   Not on file   Social Determinants of Health   Financial Resource Strain: Not on file  Food Insecurity: No Food Insecurity (11/28/2020)   Hunger Vital Sign    Worried About Running Out of Food in the Last Year: Never true    [name] in the Last Year: Never true  Transportation Needs: No Transportation Needs (11/28/2020)   PRAPARE - Hydrologist (Medical): No    Lack of Transportation (Non-Medical): No  Physical Activity: Not on file  Stress: Not on file  Social Connections: Unknown (09/05/2021)   Social Connection and Isolation Panel [NHANES]    Frequency of Communication with Friends and Family: More than three times a week    Frequency of Social Gatherings  with Friends and Family: Twice a week    Attends Religious Services: Not on Advertising copywriter or Organizations: Yes    Attends Archivist Meetings: More than 4 times per year    Marital Status: Not on file    Tobacco Counseling Counseling given: Not Answered   Clinical Intake:                 Diabetic?Yes Nutrition Risk Assessment:  Has the patient had any N/V/D within the last 2 months?  {YES/NO:21197} Does the patient have any non-healing wounds?  {YES/NO:21197} Has the patient had any unintentional weight loss or weight gain?  {YES/NO:21197}  Diabetes:  Is the patient diabetic?  {YES/NO:21197} If diabetic, was a CBG obtained today?  {YES/NO:21197} Did the patient bring in their glucometer from home?  {YES/NO:21197} How often do you monitor your CBG's? ***.   Financial Strains and Diabetes Management:  Are you having any financial strains with the device, your supplies or your medication? {YES/NO:21197}.  Does the patient want to be seen by Chronic Care Management for management of their diabetes?  {YES/NO:21197} Would the patient like to be referred to a Nutritionist or for Diabetic Management?  {YES/NO:21197}  Diabetic Exams:  {Diabetic Eye Exam:2101801} {Diabetic Foot Exam:2101802}          Activities of Daily Living     No data to display          Patient Care Team: Susy Frizzle, MD as PCP - General (Family Medicine) Eulogio Bear, NP as Nurse Practitioner (Nurse Practitioner)  Indicate any recent Medical Services you may have received from other than [facility] providers in the past year (date may be approximate).     Assessment:   This is a routine wellness examination for [name].  Hearing/Vision screen No results found.  Dietary issues and exercise activities discussed:     Goals Addressed   None    Depression Screen    12/13/2021   10:10 AM 10/17/2021   10:02 AM 09/05/2021   10:12 AM 07/25/2021     2:05 PM 05/16/2021    2:02 PM 05/01/2021   10:49 AM 03/20/2021    8:43 AM  PHQ 2/9 Scores  PHQ - 2 Score '3 2 1 2 2 3 2  '$ PHQ- 9 Score '12  7 10 9 11 10    '$ Fall Risk    12/13/2021   10:02 AM 03/20/2021    8:51 AM 12/19/2020   11:25 AM 11/28/2020    2:41 PM 11/28/2020    2:16 PM  Fall Risk   Falls in the past  year? 0 0 0 0 0  Number falls in past yr: 0 0 0    Injury with Fall? 0 0 0    Follow up   Falls evaluation completed      FALL RISK PREVENTION PERTAINING TO THE HOME:  Any stairs in or around the home? {YES/NO:21197} If so, are there any without handrails? {YES/NO:21197} Home free of loose throw rugs in walkways, pet beds, electrical cords, etc? {YES/NO:21197} Adequate lighting in your home to reduce risk of falls? {YES/NO:21197}  ASSISTIVE DEVICES UTILIZED TO PREVENT FALLS:  Life alert? {YES/NO:21197} Use of a cane, walker or w/c? {YES/NO:21197} Grab bars in the bathroom? {YES/NO:21197} Shower chair or bench in shower? {YES/NO:21197} Elevated toilet seat or a handicapped toilet? {YES/NO:21197}  TIMED UP AND GO:  Was the test performed? {YES/NO:21197}.  Length of time to ambulate 10 feet: *** sec.   {Appearance of ZOXW:9604540}  Cognitive Function:        03/20/2021    8:52 AM 11/16/2020    3:37 PM  6CIT Screen  What Year? 0 points 0 points  What month? 0 points 0 points  What time? 0 points 0 points  Count back from 20 0 points 0 points  Months in reverse 0 points 0 points  Repeat phrase 0 points 0 points  Total Score 0 points 0 points    Immunizations Immunization History  Administered Date(s) Administered   Fluad Quad(high Dose 65+) 02/02/2019, 01/09/2021   Influenza, High Dose Seasonal PF 01/26/2017, 02/12/2018   Influenza,inj,Quad PF,6+ Mos 03/19/2013, 02/10/2016   Influenza-Unspecified 12/31/2007, 12/30/2013   PFIZER(Purple Top)SARS-COV-2 Vaccination 05/14/2019, 06/04/2019, 01/14/2020   Pneumococcal Conjugate-13 03/19/2013   Pneumococcal  Polysaccharide-23 02/19/2012, 01/01/2014   Tdap 05/26/2014   Zoster, Live 07/27/2015    TDAP status: Up to date  {Flu Vaccine status:2101806}  Pneumococcal vaccine status: Up to date  Covid-19 vaccine status: Information provided on how to obtain vaccines.   Qualifies for Shingles Vaccine? Yes   Zostavax completed No   Shingrix Completed?: No.    Education has been provided regarding the importance of this vaccine. Patient has been advised to call insurance company to determine out of pocket expense if they have not yet received this vaccine. Advised may also receive vaccine at local pharmacy or Health Dept. Verbalized acceptance and understanding.  Screening Tests Health Maintenance  Topic Date Due   Zoster Vaccines- Shingrix (1 of 2) Never done   Diabetic kidney evaluation - Urine ACR  04/27/2020   INFLUENZA VACCINE  11/07/2021   COVID-19 Vaccine (4 - 2023-24 season) 12/08/2021   FOOT EXAM  12/19/2021   COLONOSCOPY (Pts 45-38yr Insurance coverage will need to be confirmed)  02/03/2022   Medicare Annual Wellness (AWV)  03/20/2022   HEMOGLOBIN A1C  03/20/2022   Diabetic kidney evaluation - eGFR measurement  09/19/2022   OPHTHALMOLOGY EXAM  12/27/2022   DTaP/Tdap/Td (2 - Td or Tdap) 05/26/2024   Pneumonia Vaccine 77 Years old  Completed   DEXA SCAN  Completed   Hepatitis C Screening  Completed   HPV VACCINES  Aged Out    Health Maintenance  Health Maintenance Due  Topic Date Due   Zoster Vaccines- Shingrix (1 of 2) Never done   Diabetic kidney evaluation - Urine ACR  04/27/2020   INFLUENZA VACCINE  11/07/2021   COVID-19 Vaccine (4 - 2023-24 season) 12/08/2021   FOOT EXAM  12/19/2021   COLONOSCOPY (Pts 45-467yrInsurance coverage will need to be confirmed)  02/03/2022   Medicare Annual  Wellness (AWV)  03/20/2022   HEMOGLOBIN A1C  03/20/2022    {Colorectal cancer screening:2101809}  Mammogram status: No longer required due to age.  Bone Density status: Completed  01/05/21. Results reflect: Bone density results: OSTEOPENIA. Repeat every 2 years.  Lung Cancer Screening: (Low Dose CT Chest recommended if Age 83-80 years, 30 pack-year currently smoking OR have quit w/in 15years.) does not qualify.   Lung Cancer Screening Referral: n/a   Additional Screening:  Hepatitis C Screening: does qualify; Completed 11/21/16  Vision Screening: Recommended annual ophthalmology exams for early detection of glaucoma and other disorders of the eye. Is the patient up to date with their annual eye exam?  Yes  Who is the provider or what is the name of the office in which the patient attends annual eye exams? *** If pt is not established with a provider, would they like to be referred to a provider to establish care? {YES/NO:21197}.   Dental Screening: Recommended annual dental exams for proper oral hygiene  Community Resource Referral / Chronic Care Management: CRR required this visit?  No   CCM required this visit?  No      Plan:     I have personally reviewed and noted the following in the patient's chart:   Medical and social history Use of alcohol, tobacco or illicit drugs  Current medications and supplements including opioid prescriptions. Patient is not currently taking opioid prescriptions. Functional ability and status Nutritional status Physical activity Advanced directives List of other physicians Hospitalizations, surgeries, and ER visits in previous 12 months Vitals Screenings to include cognitive, depression, and falls Referrals and appointments  In addition, I have reviewed and discussed with patient certain preventive protocols, quality metrics, and best practice recommendations. A written personalized care plan for preventive services as well as general preventive health recommendations were provided to patient.     Denman George [place], [place]   0/06/4740   Nurse Notes: ***

## 2022-04-17 ENCOUNTER — Ambulatory Visit (INDEPENDENT_AMBULATORY_CARE_PROVIDER_SITE_OTHER): Payer: Medicare HMO | Admitting: Podiatry

## 2022-04-17 ENCOUNTER — Ambulatory Visit (INDEPENDENT_AMBULATORY_CARE_PROVIDER_SITE_OTHER): Payer: Medicare HMO

## 2022-04-17 VITALS — BP 128/76 | Ht 65.0 in | Wt 175.4 lb

## 2022-04-17 DIAGNOSIS — Z0000 Encounter for general adult medical examination without abnormal findings: Secondary | ICD-10-CM

## 2022-04-17 DIAGNOSIS — B351 Tinea unguium: Secondary | ICD-10-CM

## 2022-04-17 DIAGNOSIS — G609 Hereditary and idiopathic neuropathy, unspecified: Secondary | ICD-10-CM

## 2022-04-17 NOTE — Progress Notes (Signed)
  Subjective:  Patient ID: Nina Olson, female    DOB: 1945/06/18,  MRN: [id number]  Chief Complaint  Patient presents with   Nail Problem    3 month follow up after nail fungus treatment.New growth right great toenail looks great    77 y.o. female presents with the above complaint. History confirmed with patient.  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 Onychomycosis      Plan:  Patient was evaluated and treated and all questions answered.  Overall doing much better she is nearly completed her fluconazole course.  She will finish this.  I do not think she needs any further oral antifungal therapy.  Discussed the presence of the melanonychia which is not fungus but rather pigmentation in the nailbed.  Discussed with her if this returns or worsens to let me know and we will schedule follow-up and further treatment.  She has an upcoming neurology appointment for her neuropathy, will defer further management to them.  r Return if symptoms worsen or fail to improve.

## 2022-04-18 NOTE — Progress Notes (Signed)
Initial neurology clinic note  SERVICE DATE: 04/26/22  Reason for Evaluation: Consultation requested by Criselda Peaches, DPM for an opinion regarding paresthesias in legs. My final recommendations will be communicated back to the requesting physician by way of shared medical record or letter to requesting physician via [place] mail.  HPI: This is [name], a 77 y.o. right-handed female with a medical history of DM, HTN, HLD, GERD, former smoker who presents to neurology clinic with the chief complaint of paresthesias in feet. The patient is alone today.  Symptoms started about 2 years ago in bilateral feet with tingling. It would feel like she was "walking on cardboard." She saw podiatry who felt she may have a neuroma as she was having particular pain in right great toe. She got an injection that helped a little, but then symptoms returned. Over the last 2 months, her feet have felt like they are tightening (left > right). She denies significant cramps. She sometimes feels off balance, but denies any falls. She endorses back pain and shooting pains from back into buttocks into legs (calling it sciatica). It is also an achy sensation. If she moves in certain ways, it can "grab" her and make it painful to move in certain way. She has never been given medication for her symptoms. She uses over the counter icy hot like things that help with the back. She has never used anything on her feet.  She does not have significant symptoms in her hands. She will occasionally wake up with a hand tingling. Of note, patient broke her left hand 25 years ago and has some residual deformity.  Patient also mentions that her right knee has been hurting for about a month and that she has swelling on the medial aspect of her right knee.  Of note, patient is on B complex and zinc for 2-3 years.  Podiatry had mentioned EMG to the patient, but she has not had this or scheduled it at this point.  She does not  report any constitutional symptoms like fever, night sweats, anorexia or unintentional weight loss.  EtOH use: No, has not drank much since 1980s Restrictive diet? No Family history of neuropathy/myopathy/NM disease? Mother has pain in legs too (?neuropathy)   MEDICATIONS:  Outpatient Encounter Medications as of 04/26/2022  Medication Sig   Accu-Chek Softclix Lancets lancets USE TO CHECK CBG ONCE A DAY   atorvastatin (LIPITOR) 40 MG tablet Take 1 tablet by mouth once daily   B Complex Vitamins (B COMPLEX PO) Take by mouth as directed.   blood glucose meter kit and supplies Dispense based on patient and insurance preference. Check CBG once a day (FOR ICD-10 E10.9, E11.9).   esomeprazole (NEXIUM) 40 MG capsule Take 1 capsule by mouth once daily   famotidine (PEPCID) 40 MG tablet Take 1 tablet by mouth once daily   glucose blood (ACCU-CHEK GUIDE) test strip USE TO CHECK CBG ONCE A DAY   hydrochlorothiazide (HYDRODIURIL) 25 MG tablet Take 0.5 tablets (12.5 mg total) by mouth daily. Pt reports she takes half of 25 mg tablet   ibuprofen (ADVIL) 800 MG tablet Take by mouth. prn   losartan (COZAAR) 100 MG tablet Take 1 tablet (100 mg total) by mouth daily.   Multiple Vitamin (MULTI-VITAMIN) tablet Take by mouth.   Omega-3 Fatty Acids (FISH OIL PO) Take by mouth as directed.   Probiotic Product (PROBIOTIC DAILY PO) Take by mouth.   traZODone (DESYREL) 50 MG tablet Take 0.5-1 tablets (  25-50 mg total) by mouth at bedtime as needed for sleep.   vitamin C (ASCORBIC ACID) 500 MG tablet Take 1 tablet (500 mg total) by mouth daily.   ZINC OXIDE PO Take by mouth.   FLUoxetine (PROZAC) 20 MG capsule Take 20 mg by mouth daily. Dr. Archie Balboa (Patient not taking: Reported on 04/26/2022)   No facility-administered encounter medications on file as of 04/26/2022.    PAST MEDICAL HISTORY: Past Medical History:  Diagnosis Date   Anemia    Arthritis    Colon polyps    GERD (gastroesophageal reflux disease)     High cholesterol    Hypertension    Osteopenia    Palpitations     PAST SURGICAL HISTORY: Past Surgical History:  Procedure Laterality Date   CESAREAN SECTION      ALLERGIES: No Known Allergies  FAMILY HISTORY: Family History  Problem Relation Age of Onset   Hypertension Mother    Osteoporosis Mother     SOCIAL HISTORY: Social History   Tobacco Use   Smoking status: Former   Smokeless tobacco: Former    Quit date: 04/09/1978  Vaping Use   Vaping Use: Never used  Substance Use Topics   Alcohol use: No   Drug use: No   Social History   Social History Narrative   Are you right handed or left handed? Right   Are you currently employed ? no   What is your current occupation?retired   Do you live at home alone? No    Who lives with you? husband   What type of home do you live in: 1 story or 2 story? one    Caffeine 1 soda a day      OBJECTIVE: PHYSICAL EXAM: BP 121/79   Pulse 90   Ht '5\' 5"'$  (1.651 m)   Wt 178 lb (80.7 kg)   SpO2 97%   BMI 29.62 kg/m   General: General appearance: Awake and alert. No distress. Cooperative with exam.  Skin: Irregular appearing mole on right anterior lower leg, new per patient HEENT: Atraumatic. Anicteric. Lungs: Non-labored breathing on room air  Extremities: No edema. Deformity of left hand - unable to fully extend fingers Musculoskeletal: No obvious joint swelling. Psych: Affect appropriate.  Neurological: Mental Status: Alert. Speech fluent. No pseudobulbar affect Cranial Nerves: CNII: No RAPD. Visual fields grossly intact. CNIII, IV, VI: PERRL. No nystagmus. EOMI. CN V: Facial sensation intact bilaterally to fine touch. CN VII: Facial muscles symmetric and strong. No ptosis at rest. CN VIII: Hearing grossly intact bilaterally. CN IX: No hypophonia. CN X: Palate elevates symmetrically. CN XI: Full strength shoulder shrug bilaterally. CN XII: Tongue protrusion full and midline. No atrophy or fasciculations. No  significant dysarthria Motor: Tone is normal.  Individual muscle group testing (MRC grade out of 5):  Movement     Neck flexion 5    Neck extension 5     Right Left   Shoulder abduction 5 5   Elbow flexion 5 5   Elbow extension 5 5   Finger abduction - FDI 5 5   Finger abduction - ADM 5 5   Finger extension 5 5   Finger distal flexion - 2/'3 5 5   '$ Finger distal flexion - 4/'5 5 5   '$ Thumb flexion - FPL 5 5   Thumb abduction - APB 5 5    Hip flexion 5 5   Hip extension 5 5   Hip adduction 5 5   Hip abduction  5 5   Knee extension 5 5   Knee flexion 5 5   Dorsiflexion 5 5   Plantarflexion 5 5   Great toe extension 5- 5-   Great toe flexion 5- 5-     Reflexes:  Right Left   Bicep 2+ 2+   Tricep 2+ 2+   BrRad 2+ 2+   Knee 2+ 2+   Ankle 2+ 2+    Pathological Reflexes: Babinski: flexor response bilaterally Hoffman: absent bilaterally Troemner: absent bilaterally Sensation: Pinprick: No clear deficit; inconsistent and patchy in bilateral lower extremities Vibration: > 15 seconds in bilateral great toes (intact) Temperature: ?less in left foot, otherwise intact Proprioception: Intact in bilateral great toes Coordination: Intact finger-to- nose-finger bilaterally. Romberg negative. Gait: Able to rise from chair with arms crossed unassisted. Normal, narrow-based gait.  Lab and Test Review: Internal labs: Normal or unremarkable: CBC, CMP, folate B12 (03/07/22): 822 HbA1c (09/18/21): 6.6  Imaging: Lumbar xray (12/05/16): FINDINGS: The lumbar vertebral bodies are preserved in height. The disc space heights are well maintained. There is mild facet joint hypertrophy at L4-5. There are no pars defects in there is no spondylolisthesis. The pedicles and transverse processes are intact. The observed portions of the sacrum are normal. The visualized portions of the SI joints appear normal for age. There is calcification in the wall of the abdominal aorta.    IMPRESSION: Mild degenerative facet joint change at L4-5. There is no compression fracture, spondylolisthesis, or high-grade disc space narrowing.   Abdominal aortic atherosclerosis.  Cervical spine xray (12/05/16): FINDINGS: Diffuse degenerative change. No acute bony or joint abnormality identified. No evidence of fracture dislocation. Pulmonary apices are clear .   IMPRESSION: Diffuse degenerative change.  No acute abnormality.  ASSESSMENT: Nina Olson is a 77 y.o. female who presents for evaluation of tingling in bilateral feet. She has a relevant medical history of DM, HTN, HLD, GERD, former smoker. Her neurological examination is essentially normal today. Available diagnostic data is significant for HbA1c 6.6. 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 Her risk factors include diabetes. I will send labs to look for other treatable causes.  PLAN: -Blood work: IFE, B6, copper, zinc -Stop B complex and zinc supplementation -Discussed EMG, will defer for now due to mild nature of symptoms -Patient to discuss diabetes treatment with PCP -Recommend Lidocaine cream PRN -Recommend alpha lipoic acid 600 mg once or twice daily -Discussed prescription medication for tingling, but patient would prefer to try over the counter options first  -Consult to dermatology for irregular looking mole on right anterior lower leg.  -Return to clinic in 6 months  The impression above as well as the plan as outlined below were extensively discussed with the patient who voiced understanding. All questions were answered to their satisfaction.  When available, results of the above investigations and possible further recommendations will be communicated to the patient via telephone/MyChart. Patient to call office if not contacted after expected testing turnaround time.   Total time spent reviewing records, interview, history/exam, documentation, and coordination  of care on day of encounter:  55 min   Thank you for allowing me to participate in patient's care.  If I can answer any additional questions, I would be pleased to do so.  Kai Levins, MD   CC: Dennard Schaumann Cammie Mcgee, MD [address] [place] [address]  CC: Referring provider: Criselda Peaches, [place] [place],  [place] [address]

## 2022-04-26 ENCOUNTER — Encounter: Payer: Self-pay | Admitting: Neurology

## 2022-04-26 ENCOUNTER — Other Ambulatory Visit (INDEPENDENT_AMBULATORY_CARE_PROVIDER_SITE_OTHER): Payer: Medicare HMO

## 2022-04-26 ENCOUNTER — Ambulatory Visit (INDEPENDENT_AMBULATORY_CARE_PROVIDER_SITE_OTHER): Payer: Medicare HMO | Admitting: Neurology

## 2022-04-26 ENCOUNTER — Ambulatory Visit (INDEPENDENT_AMBULATORY_CARE_PROVIDER_SITE_OTHER): Payer: Medicare HMO | Admitting: Gastroenterology

## 2022-04-26 ENCOUNTER — Encounter: Payer: Self-pay | Admitting: Gastroenterology

## 2022-04-26 VITALS — BP 121/79 | HR 90 | Ht 65.0 in | Wt 178.0 lb

## 2022-04-26 VITALS — BP 130/82 | HR 85 | Ht 65.0 in | Wt 178.0 lb

## 2022-04-26 DIAGNOSIS — K222 Esophageal obstruction: Secondary | ICD-10-CM | POA: Diagnosis not present

## 2022-04-26 DIAGNOSIS — R202 Paresthesia of skin: Secondary | ICD-10-CM | POA: Diagnosis not present

## 2022-04-26 DIAGNOSIS — R200 Anesthesia of skin: Secondary | ICD-10-CM

## 2022-04-26 DIAGNOSIS — D229 Melanocytic nevi, unspecified: Secondary | ICD-10-CM

## 2022-04-26 DIAGNOSIS — Z86010 Personal history of colonic polyps: Secondary | ICD-10-CM

## 2022-04-26 DIAGNOSIS — K219 Gastro-esophageal reflux disease without esophagitis: Secondary | ICD-10-CM | POA: Diagnosis not present

## 2022-04-26 DIAGNOSIS — K449 Diaphragmatic hernia without obstruction or gangrene: Secondary | ICD-10-CM

## 2022-04-26 DIAGNOSIS — R209 Unspecified disturbances of skin sensation: Secondary | ICD-10-CM | POA: Diagnosis not present

## 2022-04-26 NOTE — Patient Instructions (Signed)
I think your symptoms may be due to nerve damage in your feet called neuropathy. This is probably because of diabetes. You should talk to your primary care doctor about how to best treat your diabetes so that your neuropathy does not worsen.  I want to get labs today to look for other possible causes of neuropathy.  I recommend you stop your B complex and zinc supplements as these can cause symptoms.  For your symptoms, I recommend: Alpha lipoic acid '600mg'$  daily: Has some research data but actual dose not well established as they used IV in the clinical research trials. No major side effects other than <1% of people report upset stomach. This can be taken twice per day ('1200mg'$  daily) if no relief obtained. You can also try Lidocaine cream as needed. Apply wear you have pain, tingling, or burning. Wear gloves to prevent your hands being numb. This can be bought over the counter at any drug store or online.  If these do not give relief, we do have prescription medications we can try. Let me know if you do not get relief from the over the counter options.  I want you to see dermatology for the skin spot (mole) that you showed me on your right leg. I am putting this referral in for you.  I would like to see you back in clinic in 6 months or sooner if needed.   Please let me know if you have any questions or concerns in the meantime.  The physicians and staff at [facility] are committed to providing excellent care. You may receive a survey requesting feedback about your experience at our office. We strive to receive "very good" responses to the survey questions. If you feel that your experience would prevent you from giving the office a "very good " response, please contact our office to try to remedy the situation. We may be reached at [phone]. Thank you for taking the time out of your busy day to complete the survey.  Kai Levins, MD [facility]

## 2022-04-26 NOTE — Patient Instructions (Signed)
_______________________________________________________  If your blood pressure at your visit was 140/90 or greater, please contact your primary care physician to follow up on this.  _______________________________________________________  If you are age 76 or older, your body mass index should be between 23-30. Your Body mass index is 29.62 kg/m. If this is out of the aforementioned range listed, please consider follow up with your Primary Care Provider.  If you are age 12 or younger, your body mass index should be between 19-25. Your Body mass index is 29.62 kg/m. If this is out of the aformentioned range listed, please consider follow up with your Primary Care Provider.   ________________________________________________________  The [facility] providers would like to encourage you to use [facility] to communicate with providers for non-urgent requests or questions.  Due to long hold times on the telephone, sending your provider a message by [facility] may be a faster and more efficient way to get a response.  Please allow 48 business hours for a response.  Please remember that this is for non-urgent requests.  _______________________________________________________  Follow up as needed.  Thank you for choosing me and [facility].  Vito Cirigliano, D.O.

## 2022-04-26 NOTE — Progress Notes (Signed)
Chief Complaint:    GERD, discuss procedure results  GI History: Nina Olson is a 77 y.o. female with a history of diabetes with neuropathy, HTN, HLD, bladder prolapse, OA, DDD, anxiety/depression who follows in the GI clinic for the following:  1) GERD: Index symptoms of heartburn, regurgitation, belching.  Increased nocturnal regurgitation and belching in 2023, with occasional pill dysphagia along with episodes of dysphagia to liquids alone.  Has been taking Nexium 40 mg/day for a few years then started famotidine 40 mg daily in 2023.  2) Epigastric pain.  Episodic epigastric pain and abdominal bloating, typically lasting 1-2 days then resolves without intervention.  No associated nausea/vomiting or GI bleeding.  No changes in bowel habits.  Resolved.  3) Dysphagia, Schatzki's ring.  Resolved with EGD with 18 mm Savary dilation in 03/2022.   Endoscopic History: - Colonoscopy (03/10/2007, Dr. Collene Mares): No report available for review - Colonoscopy (05/17/2014, Dr. Collene Mares): Diminutive cecal polyp (path: TA), 15 mm pedunculated polyp in the proximal descending colon at 60 cm removed with hot snare and epinephrine (path: TVA), Sigmoid diverticulosis.  Normal TI - Colonoscopy (02/04/2019, Dr. Collene Mares): Pandiverticulosis, 2 small polyps in the mid ascending and cecum (path: Adenomas).  Normal TI.  Recommended repeat in 5 years - EGD (03/15/2022, Dr. Bryan Lemma): Nonobstructing Schatzki's ring dilated with 18 mm Savary then fractured with forceps.  Esophageal biopsies negative for EOE.  1 cm HH, fundic gland polyps, normal duodenum (path benign)   HPI:     Patient is a 77 y.o. female presenting to the Gastroenterology Clinic for follow-up.  Was initially seen by me on 03/09/2022 for evaluation of GERD with progressive breakthrough symptoms along with episodic dysphagia as outlined above.  Underwent EGD on 03/15/2022 which was notable for nonobstructing Schatzki's ring dilated to 18 mm Savary, as outlined  above.  Today, she states dysphagia improved with EGD with dilation. Reflux sxs well controlled with Nexium and Pepcid. No recurrence of epigastric pain. No n/v, good p.o. intake.    Review of systems:     No chest pain, no SOB, no fevers, no urinary sx   Past Medical History:  Diagnosis Date   Anemia    Arthritis    Colon polyps    GERD (gastroesophageal reflux disease)    High cholesterol    Hypertension    Osteopenia    Palpitations     Patient's surgical history, family medical history, social history, medications and allergies were all reviewed in Epic    Current Outpatient Medications  Medication Sig Dispense Refill   Accu-Chek Softclix Lancets lancets USE TO CHECK CBG ONCE A DAY 100 each 0   atorvastatin (LIPITOR) 40 MG tablet Take 1 tablet by mouth once daily 90 tablet 0   B Complex Vitamins (B COMPLEX PO) Take by mouth as directed.     blood glucose meter kit and supplies Dispense based on patient and insurance preference. Check CBG once a day (FOR ICD-10 E10.9, E11.9). 1 each 0   esomeprazole (NEXIUM) 40 MG capsule Take 1 capsule by mouth once daily 90 capsule 1   famotidine (PEPCID) 40 MG tablet Take 1 tablet by mouth once daily 30 tablet 0   FLUoxetine (PROZAC) 20 MG capsule Take 20 mg by mouth daily. Dr. Archie Balboa     glucose blood (ACCU-CHEK GUIDE) test strip USE TO CHECK CBG ONCE A DAY 100 each 6   hydrochlorothiazide (HYDRODIURIL) 25 MG tablet Take 0.5 tablets (12.5 mg total) by mouth daily. Pt reports  she takes half of 25 mg tablet 90 tablet 3   ibuprofen (ADVIL) 800 MG tablet Take by mouth. prn     losartan (COZAAR) 100 MG tablet Take 1 tablet (100 mg total) by mouth daily. 90 tablet 1   Multiple Vitamin (MULTI-VITAMIN) tablet Take by mouth.     Omega-3 Fatty Acids (FISH OIL PO) Take by mouth as directed.     Probiotic Product (PROBIOTIC DAILY PO) Take by mouth.     traZODone (DESYREL) 50 MG tablet Take 0.5-1 tablets (25-50 mg total) by mouth at bedtime as needed  for sleep. 90 tablet 1   vitamin C (ASCORBIC ACID) 500 MG tablet Take 1 tablet (500 mg total) by mouth daily.     ZINC OXIDE PO Take by mouth.     No current facility-administered medications for this visit.    Physical Exam:     BP 130/82   Pulse 85   Ht '5\' 5"'$  (1.651 m)   Wt 178 lb (80.7 kg)   SpO2 98%   BMI 29.62 kg/m   GENERAL:  Pleasant female in NAD PSYCH: : Cooperative, normal affect NEURO: Alert and oriented x 3, no focal neurologic deficits   IMPRESSION and PLAN:    1) GERD 2) Hiatal hernia - Reflux symptoms well-controlled on current therapy - Resume Nexium and Pepcid as prescribed  3) Dysphagia 4) Schatzki's ring Dysphagia resolved with EGD with Savary dilation and improved control of reflux - Patient to call me if any return of symptoms and to plan for either repeat upper endoscopy with repeat esophageal dilation vs referral for Esophageal Manometry  5) History of colon polyps - Repeat colonoscopy in 01/2024 for ongoing surveillance   RTC prn           Dominic Pea Caliann Leckrone ,DO, FACG 04/26/2022, 10:19 AM

## 2022-05-01 ENCOUNTER — Other Ambulatory Visit: Payer: Self-pay | Admitting: Family Medicine

## 2022-05-01 DIAGNOSIS — E782 Mixed hyperlipidemia: Secondary | ICD-10-CM

## 2022-05-01 NOTE — Telephone Encounter (Signed)
Requested Prescriptions  Pending Prescriptions Disp Refills   atorvastatin (LIPITOR) 40 MG tablet [Pharmacy Med Name: Atorvastatin Calcium 40 MG Oral Tablet] 90 tablet 0    Sig: Take 1 tablet by mouth once daily     Cardiovascular:  Antilipid - Statins Failed - 05/01/2022  8:40 AM      Failed - Lipid Panel in normal range within the last 12 months    Cholesterol  Date Value Ref Range Status  09/18/2021 160 <200 mg/dL Final   LDL Cholesterol (Calc)  Date Value Ref Range Status  09/18/2021 99 mg/dL (calc) Final    Comment:    Reference range: <100 . Desirable range <100 mg/dL for primary prevention;   <70 mg/dL for patients with CHD or diabetic patients  with > or = 2 CHD risk factors. Marland Kitchen LDL-C is now calculated using the Martin-Hopkins  calculation, which is a validated novel method providing  better accuracy than the Friedewald equation in the  estimation of LDL-C.  Cresenciano Genre et al. Annamaria Helling. 1761;607(37): 2061-2068  (http://education.[employer].com/faq/FAQ164)    HDL  Date Value Ref Range Status  09/18/2021 44 (L) > OR = 50 mg/dL Final   Triglycerides  Date Value Ref Range Status  09/18/2021 77 <150 mg/dL Final         Passed - Patient is not pregnant      Passed - Valid encounter within last 12 months    Recent Outpatient Visits           9 months ago Gastroesophageal reflux disease without esophagitis   [place] Pickard, Cammie Mcgee, MD   1 year ago Insomnia secondary to depression with anxiety   [place] Eulogio Bear, NP   1 year ago Encounter for annual wellness visit (AWV) in Medicare patient   [place], Jessica A, NP   1 year ago HSV (herpes simplex virus) anogenital infection   [place] Eulogio Bear, NP   1 year ago Controlled type 2 diabetes mellitus with neuropathy ([place])   [place] Eulogio Bear, NP       Future Appointments              In 1 month Pickard, Cammie Mcgee, MD [place]

## 2022-05-03 NOTE — Addendum Note (Signed)
Addended by: Renae Gloss on: 05/03/2022 09:07 AM   Modules accepted: Orders

## 2022-05-05 LAB — IMMUNOFIXATION ELECTROPHORESIS
IgG (Immunoglobin G), Serum: 1157 mg/dL (ref 600–1540)
IgM, Serum: 155 mg/dL (ref 50–300)
Immunoglobulin A: 247 mg/dL (ref 70–320)

## 2022-05-05 LAB — COPPER, SERUM: Copper: 173 ug/dL (ref 70–175)

## 2022-05-05 LAB — VITAMIN B6: Vitamin B6: 10.6 ng/mL (ref 2.1–21.7)

## 2022-05-05 LAB — ZINC: Zinc: 74 ug/dL (ref 60–130)

## 2022-05-11 ENCOUNTER — Telehealth: Payer: Self-pay | Admitting: Neurology

## 2022-05-11 NOTE — Telephone Encounter (Signed)
Pt called in and left a message. She said she saw her results on mychart, but is wanting to find out what to do next?

## 2022-05-11 NOTE — Telephone Encounter (Signed)
Called patient and provided her with results. Patient wanted to know if Dr. Berdine Addison wants her to continue her vitamin supplements. Patient is aware that if I don't call her back today, I will call her back on Monday. Patient verbalized understanding and had no further questions or concerns.

## 2022-05-11 NOTE — Telephone Encounter (Signed)
Called patient and informed her of Dr. Cathey Endow advice that he would recommend she stop the B complex and zinc. The levels are currently normal, so she does not need them. Also, if they become elevated they could cause other symptoms. It is best not to take them unless it is needed because of low levels, which she did not have. Patient verbalized understanding and had no further questions or concerns.

## 2022-05-14 DIAGNOSIS — H16223 Keratoconjunctivitis sicca, not specified as Sjogren's, bilateral: Secondary | ICD-10-CM | POA: Diagnosis not present

## 2022-05-14 DIAGNOSIS — Z961 Presence of intraocular lens: Secondary | ICD-10-CM | POA: Diagnosis not present

## 2022-05-14 DIAGNOSIS — H04123 Dry eye syndrome of bilateral lacrimal glands: Secondary | ICD-10-CM | POA: Diagnosis not present

## 2022-05-14 DIAGNOSIS — I10 Essential (primary) hypertension: Secondary | ICD-10-CM | POA: Diagnosis not present

## 2022-06-03 IMAGING — MR MR FOOT*R* WO/W CM
5 of 10 series · 22 of 40 positions shown · IV contrast (15 ml multihance)
Comparison: Plain films right foot 10/30/2018.

CLINICAL DATA: Chronic right foot pain with swelling and burning
sensation. Numbness in the toes. History of Morton's neuroma.

EXAM:
MRI OF THE RIGHT FOREFOOT WITHOUT AND WITH CONTRAST
TECHNIQUE: Multiplanar, multisequence MR imaging of the right forefoot was
performed before and after the administration of intravenous
contrast.
CONTRAST:  15 ML MULTIHANCE GADOBENATE DIMEGLUMINE 529 MG/ML IV SOLN

[Series 6: T1 · coronal · 3.0mm · 0.19mm/px · 6 of 50 slices shown]
[im 1/50]
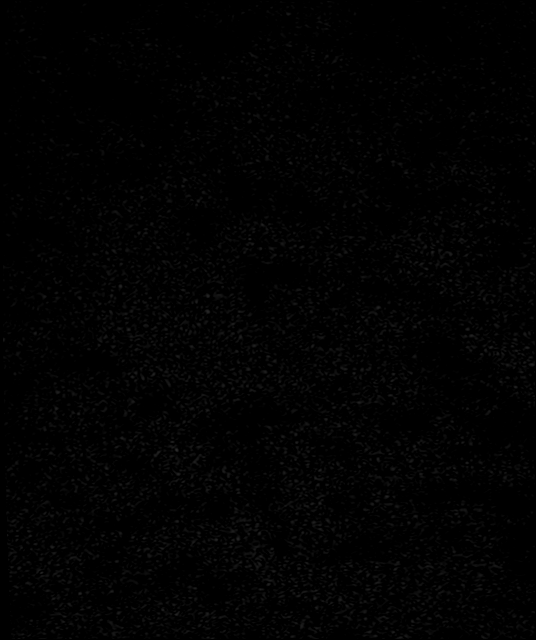
[im 10/50]
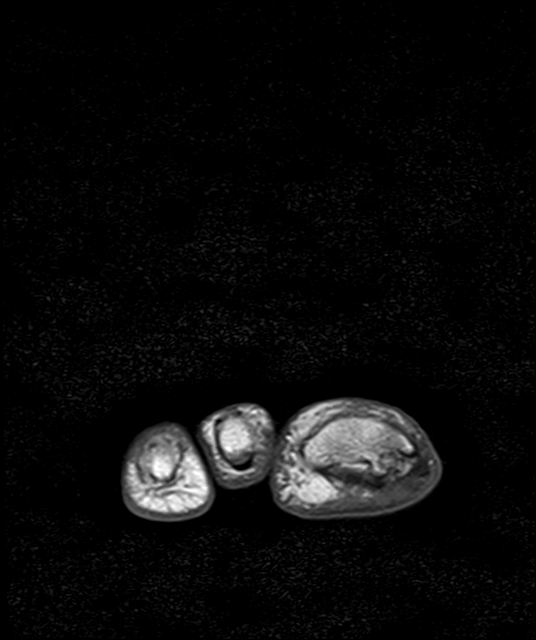
[im 20/50]
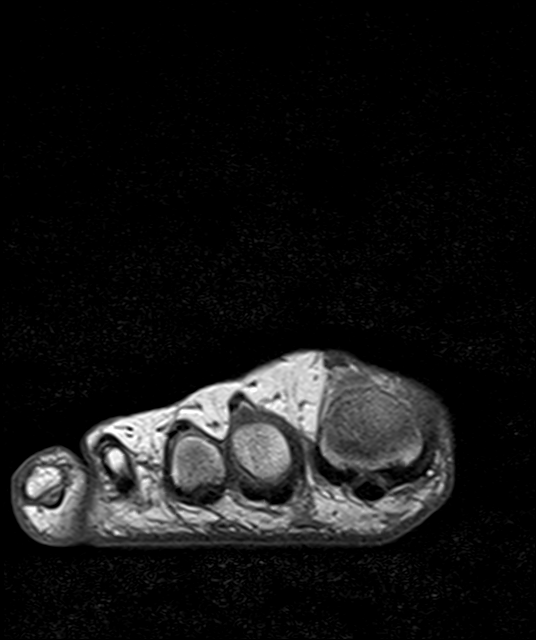
[im 30/50]
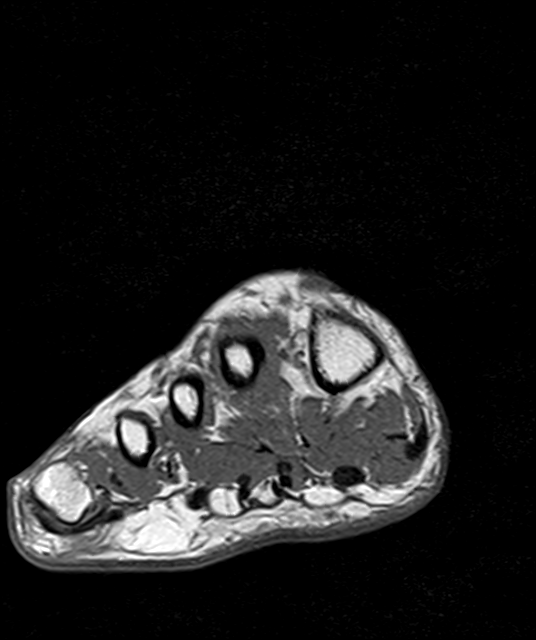
[im 40/50]
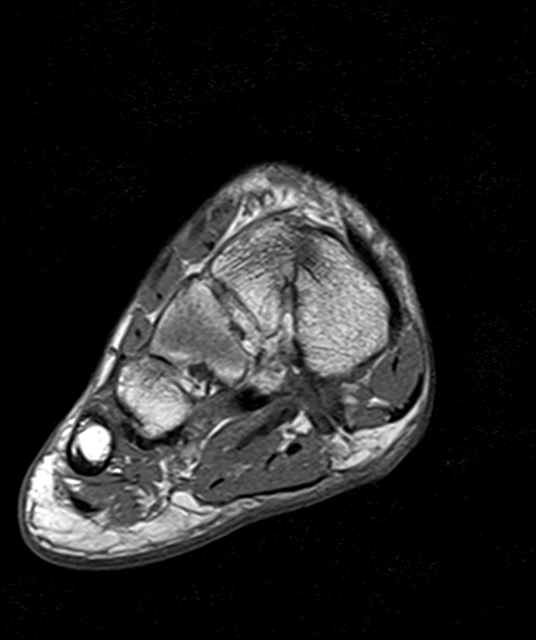
[im 50/50]
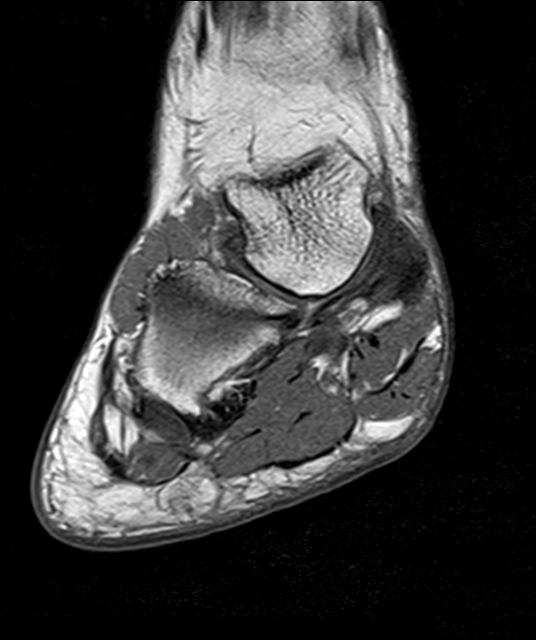

[Series 7: T2 fat-sat · coronal · 3.0mm · 0.19mm/px · 5 of 50 slices shown (1 of 2)]
[im 1/50]
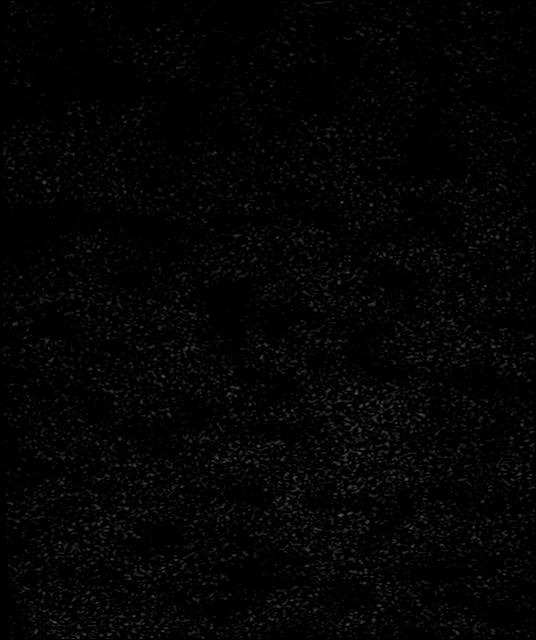
[im 13/50]
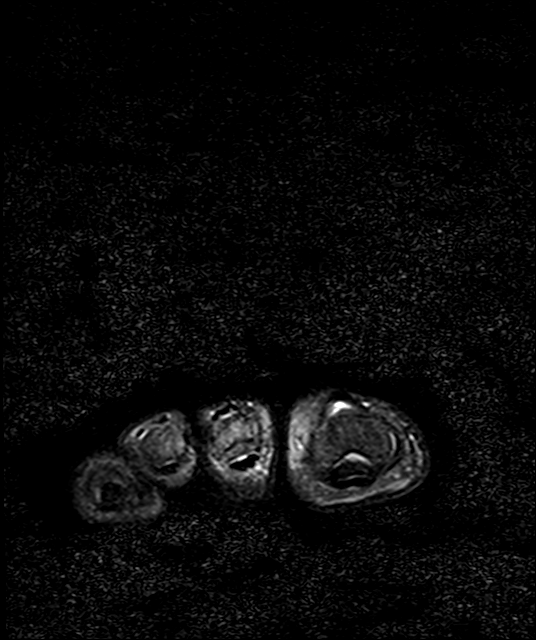
[im 25/50]
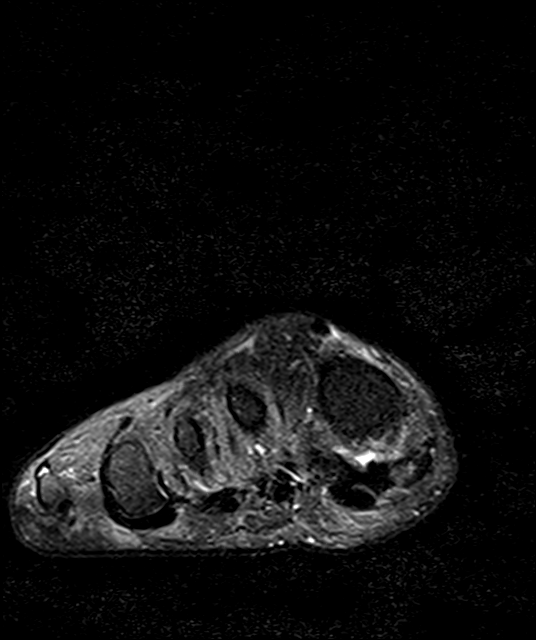
[im 37/50]
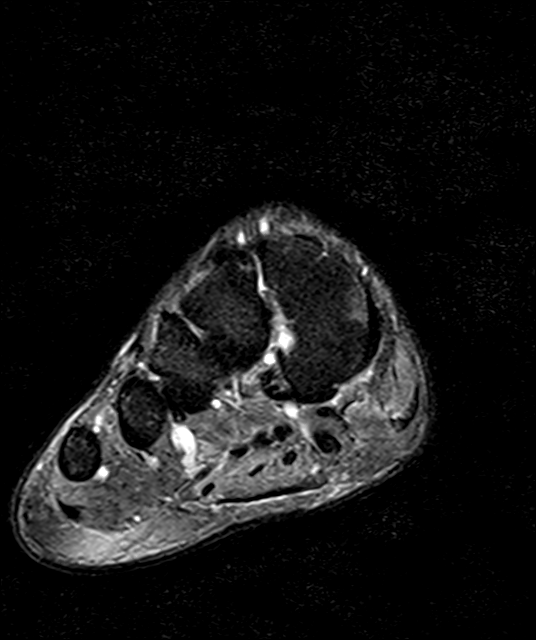
[im 50/50]
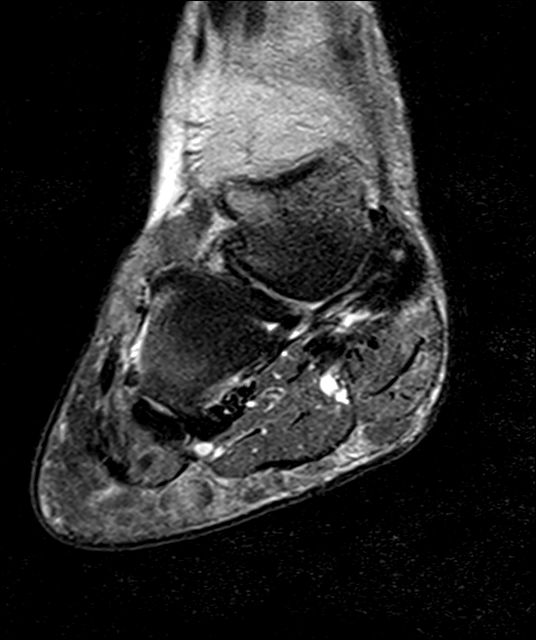

[Series 8: T2 fat-sat · axial · 3.0mm · 0.35mm/px · z∈[-161,-56]mm · 3 of 28 slices shown (2 of 2)]
[im 1/28]
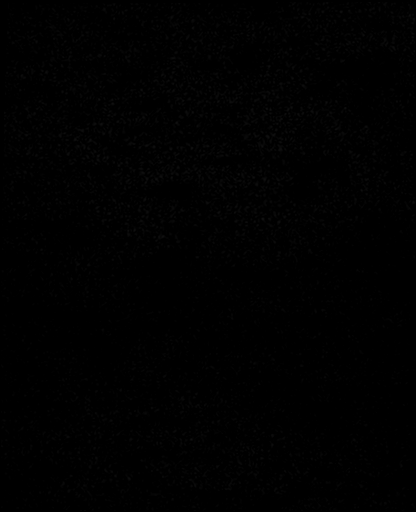
[im 14/28]
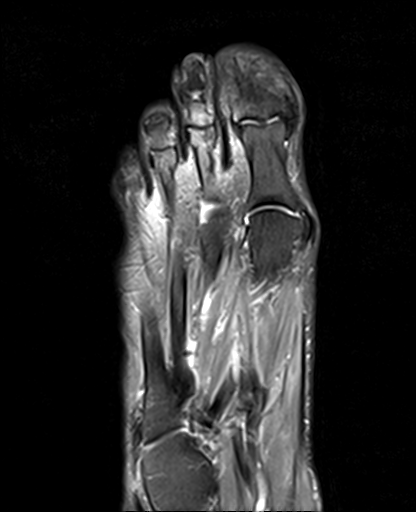
[im 28/28]
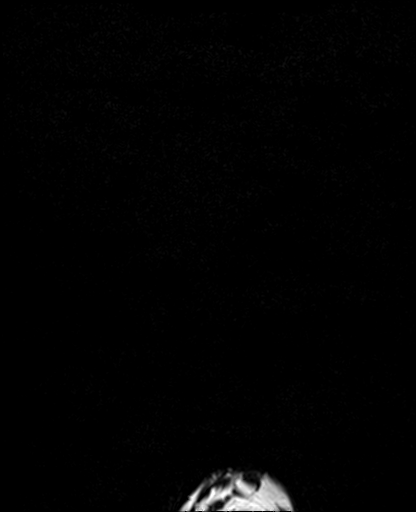

[Series 11: PD fat-sat · sagittal · 3.0mm · 0.56mm/px · 4 of 33 slices shown (1 of 2)]
[im 1/33]
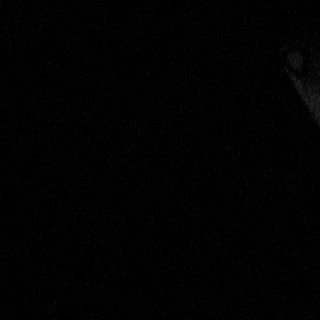
[im 11/33]
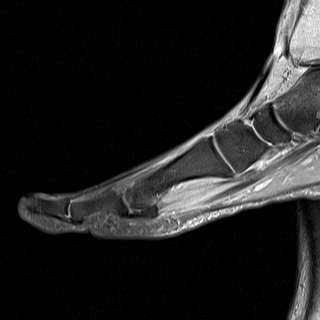
[im 22/33]
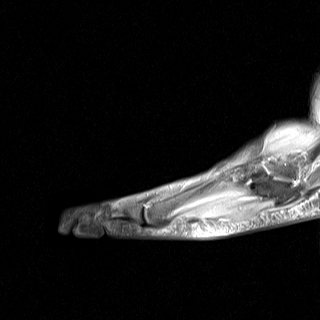
[im 33/33]
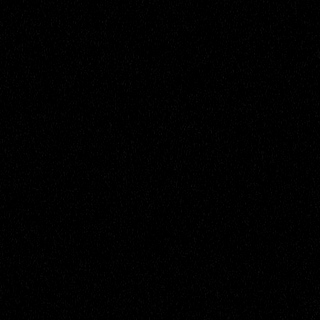

[Series 14: PD fat-sat · sagittal · 3.0mm · 0.56mm/px · 4 of 33 slices shown (2 of 2)]
[im 1/33]
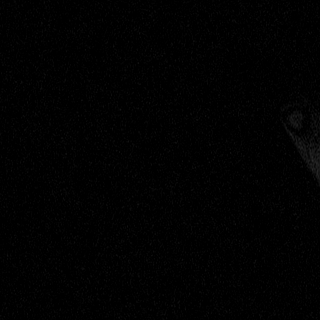
[im 11/33]
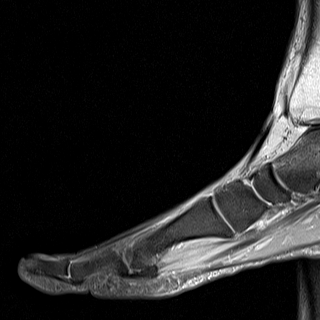
[im 22/33]
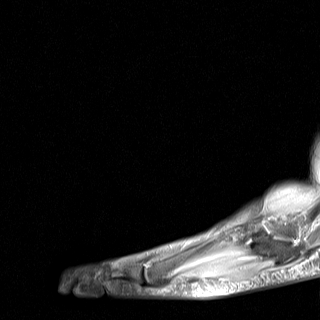
[im 33/33]
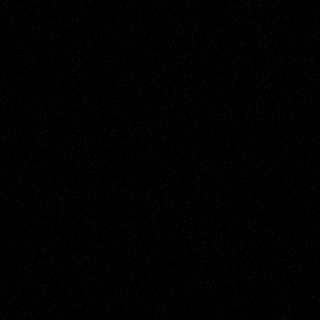

[22 of 40 positions shown; findings below may reference images not displayed]

FINDINGS: Bones/Joint/Cartilage

Marrow signal is normal without fracture, stress change or focal
lesion. No joint effusion. Joint spaces are preserved.

Ligaments

Intact and normal in appearance.

Muscles and Tendons

Intact and normal in appearance.

Soft tissues

No mass or fluid collection is seen. Soft tissues are negative. In
particular, no Morton's neuroma is identified. No pathologic
enhancement after contrast administration.
IMPRESSION: Normal MRI right foot.

## 2022-06-04 ENCOUNTER — Encounter: Payer: Self-pay | Admitting: Family Medicine

## 2022-06-04 ENCOUNTER — Ambulatory Visit (INDEPENDENT_AMBULATORY_CARE_PROVIDER_SITE_OTHER): Payer: Medicare HMO | Admitting: Family Medicine

## 2022-06-04 VITALS — BP 126/76 | HR 99 | Temp 97.8°F | Ht 65.0 in | Wt 177.8 lb

## 2022-06-04 DIAGNOSIS — I10 Essential (primary) hypertension: Secondary | ICD-10-CM

## 2022-06-04 DIAGNOSIS — Z0001 Encounter for general adult medical examination with abnormal findings: Secondary | ICD-10-CM | POA: Diagnosis not present

## 2022-06-04 DIAGNOSIS — E782 Mixed hyperlipidemia: Secondary | ICD-10-CM

## 2022-06-04 DIAGNOSIS — E1140 Type 2 diabetes mellitus with diabetic neuropathy, unspecified: Secondary | ICD-10-CM

## 2022-06-04 DIAGNOSIS — Z0000 Encounter for general adult medical examination without abnormal findings: Secondary | ICD-10-CM

## 2022-06-04 DIAGNOSIS — Z1231 Encounter for screening mammogram for malignant neoplasm of breast: Secondary | ICD-10-CM

## 2022-06-04 MED ORDER — GABAPENTIN 300 MG PO CAPS: 300.0000 mg | ORAL_CAPSULE | Freq: Every evening | ORAL | 3 refills | Status: AC | PRN

## 2022-06-04 NOTE — Progress Notes (Signed)
Subjective:    Patient ID: Nina Olson, female    DOB: 22-Oct-1945, 77 y.o.   MRN: [id number]  HPI Patient is a very pleasant 77 year old African-American female here today for complete physical exam.  Her last mammogram was in 2022.  This is due.  Her last bone density test showed mild osteopenia.  This is not due again until next year.  She had a colonoscopy in 2020.  They felt to, tubular adenomas.  They recommended a repeat colonoscopy in 2025.  She is not due for a Pap smear due to age.  Her immunizations are up-to-date as shown below Immunization History  Administered Date(s) Administered   Fluad Quad(high Dose 65+) 02/02/2019, 01/09/2021, 12/14/2021   Influenza, High Dose Seasonal PF 01/26/2017, 02/12/2018   Influenza,inj,Quad PF,6+ Mos 03/19/2013, 02/10/2016   Influenza-Unspecified 12/31/2007, 12/30/2013   PFIZER(Purple Top)SARS-COV-2 Vaccination 05/14/2019, 06/04/2019, 01/14/2020   Pneumococcal Conjugate-13 03/19/2013   Pneumococcal Polysaccharide-23 02/19/2012, 01/01/2014   Tdap 05/26/2014   Zoster Recombinat (Shingrix) 12/13/2021, 02/16/2022   Zoster, Live 07/27/2015   She has a history of diabetes.  She is not currently taking any medication to manage her sugars.  She is overdue for an A1c.  She also reports trouble controlling her appetite.  She is worried about weight gain.  She is interested in [place] which her daughter takes.  She also reports burning neuropathy in both feet.  She has excellent pulses in both feet but she does have diminished sensation to 10 g monofilament.  She especially gets throbbing burning pain in her right great toe especially in the evenings. Past Medical History:  Diagnosis Date   Anemia    Arthritis    Colon polyps    GERD (gastroesophageal reflux disease)    High cholesterol    Hypertension    Osteopenia    Palpitations    Past Surgical History:  Procedure Laterality Date   CESAREAN SECTION     Current Outpatient Medications on File  Prior to Visit  Medication Sig Dispense Refill   Accu-Chek Softclix Lancets lancets USE TO CHECK CBG ONCE A DAY 100 each 0   atorvastatin (LIPITOR) 40 MG tablet Take 1 tablet by mouth once daily 90 tablet 0   blood glucose meter kit and supplies Dispense based on patient and insurance preference. Check CBG once a day (FOR ICD-10 E10.9, E11.9). 1 each 0   esomeprazole (NEXIUM) 40 MG capsule Take 1 capsule by mouth once daily 90 capsule 1   famotidine (PEPCID) 40 MG tablet Take 1 tablet by mouth once daily 30 tablet 0   glucose blood (ACCU-CHEK GUIDE) test strip USE TO CHECK CBG ONCE A DAY 100 each 6   hydrochlorothiazide (HYDRODIURIL) 25 MG tablet Take 0.5 tablets (12.5 mg total) by mouth daily. Pt reports she takes half of 25 mg tablet 90 tablet 3   ibuprofen (ADVIL) 800 MG tablet Take by mouth. prn     losartan (COZAAR) 100 MG tablet Take 1 tablet (100 mg total) by mouth daily. 90 tablet 1   Multiple Vitamin (MULTI-VITAMIN) tablet Take by mouth.     Omega-3 Fatty Acids (FISH OIL PO) Take by mouth as directed.     Probiotic Product (PROBIOTIC DAILY PO) Take by mouth.     traZODone (DESYREL) 50 MG tablet Take 0.5-1 tablets (25-50 mg total) by mouth at bedtime as needed for sleep. 90 tablet 1   No current facility-administered medications on file prior to visit.   No Known Allergies Social History  Socioeconomic History   Marital status: Married    Spouse name: Not on file   Number of children: 3   Years of education: Not on file   Highest education level: Not on file  Occupational History   Not on file  Tobacco Use   Smoking status: Former   Smokeless tobacco: Former    Quit date: 04/09/1978  Vaping Use   Vaping Use: Never used  Substance and Sexual Activity   Alcohol use: No   Drug use: No   Sexual activity: Never  Other Topics Concern   Not on file  Social History Narrative   Are you right handed or left handed? Right   Are you currently employed ? no   What is your  current occupation?retired   Do you live at home alone? No    Who lives with you? husband   What type of home do you live in: 1 story or 2 story? one    Caffeine 1 soda a day    Social Determinants of Health   Financial Resource Strain: Low Risk  (04/17/2022)   Overall Financial Resource Strain (CARDIA)    Difficulty of Paying Living Expenses: Not hard at all  Food Insecurity: No Food Insecurity (04/17/2022)   Hunger Vital Sign    Worried About Running Out of Food in the Last Year: Never true    Ran Out of Food in the Last Year: Never true  Transportation Needs: No Transportation Needs (04/17/2022)   PRAPARE - Hydrologist (Medical): No    Lack of Transportation (Non-Medical): No  Physical Activity: Insufficiently Active (04/17/2022)   Exercise Vital Sign    Days of Exercise per Week: 3 days    Minutes of Exercise per Session: 30 min  Stress: No Stress Concern Present (04/17/2022)   [place]    Feeling of Stress : Not at all  Social Connections: [place] (04/17/2022)   Social Connection and Isolation Panel [NHANES]    Frequency of Communication with Friends and Family: More than three times a week    Frequency of Social Gatherings with Friends and Family: Three times a week    Attends Religious Services: More than 4 times per year    Active Member of Clubs or Organizations: Yes    Attends Archivist Meetings: More than 4 times per year    Marital Status: Married  Human resources officer Violence: Not At Risk (04/17/2022)   Humiliation, Afraid, Rape, and Kick questionnaire    Fear of Current or Ex-Partner: No    Emotionally Abused: No    Physically Abused: No    Sexually Abused: No     Review of Systems  All other systems reviewed and are negative.      Objective:   Physical Exam Constitutional:      Appearance: Normal appearance. She is normal weight.  HENT:     Right  Ear: Tympanic membrane, ear canal and external ear normal.     Left Ear: Tympanic membrane, ear canal and external ear normal.     Mouth/Throat:     Mouth: Mucous membranes are moist.     Pharynx: No oropharyngeal exudate or posterior oropharyngeal erythema.  Eyes:     General: No scleral icterus.    Extraocular Movements: Extraocular movements intact.     Conjunctiva/sclera: Conjunctivae normal.     Pupils: Pupils are equal, round, and reactive to light.  Neck:  Vascular: No carotid bruit.  Cardiovascular:     Rate and Rhythm: Normal rate and regular rhythm.     Pulses: Normal pulses.     Heart sounds: Normal heart sounds. No murmur heard.    No friction rub. No gallop.  Pulmonary:     Effort: Pulmonary effort is normal. No respiratory distress.     Breath sounds: Normal breath sounds. No stridor. No wheezing, rhonchi or rales.  Abdominal:     General: Bowel sounds are normal. There is no distension.     Palpations: Abdomen is soft. There is no mass.     Tenderness: There is no abdominal tenderness. There is no guarding or rebound.  Musculoskeletal:     Cervical back: Normal range of motion and neck supple.     Right lower leg: No edema.     Left lower leg: No edema.  Lymphadenopathy:     Cervical: No cervical adenopathy.  Skin:    Findings: No erythema or rash.  Neurological:     General: No focal deficit present.     Mental Status: She is alert and oriented to person, place, and time.     Cranial Nerves: No cranial nerve deficit.     Sensory: No sensory deficit.     Motor: No weakness.     Coordination: Coordination normal.     Gait: Gait normal.     Deep Tendon Reflexes: Reflexes normal.  Psychiatric:        Mood and Affect: Mood normal.        Behavior: Behavior normal.        Thought Content: Thought content normal.        Judgment: Judgment normal.           Assessment & Plan:  Controlled type 2 diabetes mellitus with neuropathy ([place]) - Plan: CBC with  Differential/Platelet, Lipid panel, COMPLETE METABOLIC PANEL WITH GFR, Hemoglobin A1c, Protein / Creatinine Ratio, Urine  Encounter for screening mammogram for malignant neoplasm of breast - Plan: MM Digital Screening  Encounter for Medicare annual wellness exam  Essential hypertension  Mixed hyperlipidemia Reviewed her cancer screening she is due for mammogram and I will schedule this for her.  Colonoscopy is due next year.  Pap smear is not required.  With regards to her immunizations these are all up-to-date as shown above.  Bone density test is not until next year.  Blood pressure today is excellent 126/76.  Check a hemoglobin A1c, CBC, CMP, lipid panel, and a urine protein to creatinine ratio.  Goal A1c is less than 6.5.  If greater than 6.5 will start Ozempic per the patient request.  Goal LDL cholesterol is less than 100.  Goal urine protein to creatinine ratio is less than 200.  I did give the patient gabapentin 300 mg p.o. nightly as needed neuropathic pain.  She can uptitrate as tolerated to 3 times daily

## 2022-06-05 LAB — CBC WITH DIFFERENTIAL/PLATELET
Absolute Monocytes: 624 cells/uL (ref 200–950)
Basophils Absolute: 31 cells/uL (ref 0–200)
Basophils Relative: 0.8 %
Eosinophils Absolute: 250 cells/uL (ref 15–500)
Eosinophils Relative: 6.4 %
HCT: 39.1 % (ref 35.0–45.0)
Hemoglobin: 12.8 g/dL (ref 11.7–15.5)
Lymphs Abs: 1310 cells/uL (ref 850–3900)
MCH: 27.7 pg (ref 27.0–33.0)
MCHC: 32.7 g/dL (ref 32.0–36.0)
MCV: 84.6 fL (ref 80.0–100.0)
MPV: 9.5 fL (ref 7.5–12.5)
Monocytes Relative: 16 %
Neutro Abs: 1685 cells/uL (ref 1500–7800)
Neutrophils Relative %: 43.2 %
Platelets: 294 10*3/uL (ref 140–400)
RBC: 4.62 10*6/uL (ref 3.80–5.10)
RDW: 13.3 % (ref 11.0–15.0)
Total Lymphocyte: 33.6 %
WBC: 3.9 10*3/uL (ref 3.8–10.8)

## 2022-06-05 LAB — LIPID PANEL
Cholesterol: 183 mg/dL (ref <200)
HDL: 52 mg/dL (ref >=50)
LDL Cholesterol (Calc): 113 mg/dL (calc) — ABNORMAL HIGH
Non-HDL Cholesterol (Calc): 131 mg/dL (calc) — ABNORMAL HIGH (ref <130)
Total CHOL/HDL Ratio: 3.5 (calc) (ref <5.0)
Triglycerides: 82 mg/dL (ref <150)

## 2022-06-05 LAB — PROTEIN / CREATININE RATIO, URINE
Creatinine, Urine: 209 mg/dL (ref 20–275)
Protein/Creat Ratio: 91 mg/g creat (ref 24–184)
Protein/Creatinine Ratio: 0.091 mg/mg creat (ref 0.024–0.184)
Total Protein, Urine: 19 mg/dL (ref 5–24)

## 2022-06-05 LAB — COMPLETE METABOLIC PANEL WITH GFR
AG Ratio: 1.5 (calc) (ref 1.0–2.5)
ALT: 18 U/L (ref 6–29)
AST: 16 U/L (ref 10–35)
Albumin: 4.4 g/dL (ref 3.6–5.1)
Alkaline phosphatase (APISO): 132 U/L (ref 37–153)
BUN: 14 mg/dL (ref 7–25)
CO2: 28 mmol/L (ref 20–32)
Calcium: 9.6 mg/dL (ref 8.6–10.4)
Chloride: 105 mmol/L (ref 98–110)
Creat: 0.91 mg/dL (ref 0.60–1.00)
Globulin: 3 g/dL (calc) (ref 1.9–3.7)
Glucose, Bld: 116 mg/dL — ABNORMAL HIGH (ref 65–99)
Potassium: 3.8 mmol/L (ref 3.5–5.3)
Sodium: 142 mmol/L (ref 135–146)
Total Bilirubin: 0.5 mg/dL (ref 0.2–1.2)
Total Protein: 7.4 g/dL (ref 6.1–8.1)
eGFR: 66 mL/min/{1.73_m2} (ref >=60)

## 2022-06-05 LAB — HEMOGLOBIN A1C
Hgb A1c MFr Bld: 7.3 % of total Hgb — ABNORMAL HIGH (ref <5.7)
Mean Plasma Glucose: 163 mg/dL
eAG (mmol/L): 9 mmol/L

## 2022-06-06 ENCOUNTER — Other Ambulatory Visit: Payer: Self-pay

## 2022-06-06 MED ORDER — SEMAGLUTIDE(0.25 OR 0.5MG/DOS) 2 MG/3ML ~~LOC~~ SOPN: 0.5000 mL | PEN_INJECTOR | SUBCUTANEOUS | 3 refills | Status: DC

## 2022-06-06 MED ORDER — SEMAGLUTIDE(0.25 OR 0.5MG/DOS) 2 MG/3ML ~~LOC~~ SOPN: 0.5000 mg | PEN_INJECTOR | SUBCUTANEOUS | 3 refills | Status: DC

## 2022-06-07 ENCOUNTER — Other Ambulatory Visit: Payer: Self-pay

## 2022-06-07 ENCOUNTER — Telehealth: Payer: Self-pay

## 2022-06-07 MED ORDER — SEMAGLUTIDE(0.25 OR 0.5MG/DOS) 2 MG/3ML ~~LOC~~ SOPN: 0.5000 mg | PEN_INJECTOR | SUBCUTANEOUS | 3 refills | Status: DC

## 2022-06-07 MED ORDER — ATORVASTATIN CALCIUM 80 MG PO TABS: 80.0000 mg | ORAL_TABLET | Freq: Every day | ORAL | 3 refills | Status: DC

## 2022-06-07 NOTE — Telephone Encounter (Signed)
Spoke w/pt this morning, told pt per Dr. Dennard Schaumann Increase lipitor to 80 mg poqday   Pt voiced understanding and nothing further.

## 2022-06-11 ENCOUNTER — Ambulatory Visit (INDEPENDENT_AMBULATORY_CARE_PROVIDER_SITE_OTHER): Payer: Medicare HMO | Admitting: Family Medicine

## 2022-06-11 VITALS — BP 120/72 | HR 97 | Temp 97.7°F | Wt 175.0 lb

## 2022-06-11 DIAGNOSIS — M25561 Pain in right knee: Secondary | ICD-10-CM

## 2022-06-11 MED ORDER — CELECOXIB 200 MG PO CAPS: 200.0000 mg | ORAL_CAPSULE | Freq: Two times a day (BID) | ORAL | 1 refills | Status: DC

## 2022-06-11 NOTE — Progress Notes (Signed)
Subjective:    Patient ID: Nina Olson, female    DOB: [birth year], 77 y.o.   MRN: [id number]  Knee Pain    Patient is a very pleasant 77 year old African-American female who presents with several weeks of pain and stiffness of right knee.  She has her knee she can feel a grinding.  She reports pain and stiffness over the medial and lateral joint lines anteriorly.  There is also a small effusion today on exam.  She denies any pain or tenderness or stiffness in the posterior aspect of her knee.  She denies any laxity to varus or valgus stress.  Today on examination, she has a negative anterior and posterior drawer sign.  There is no laxity to varus or valgus stress.  There is small lateral effusion.  She also has some tenderness to palpation over the medial and lateral joint lines.  She has negative Apley grind.  She denies any locking or catching in the knee Past Medical History:  Diagnosis Date   Anemia    Arthritis    Colon polyps    GERD (gastroesophageal reflux disease)    High cholesterol    Hypertension    Osteopenia    Palpitations    Past Surgical History:  Procedure Laterality Date   CESAREAN SECTION     Current Outpatient Medications on File Prior to Visit  Medication Sig Dispense Refill   Accu-Chek Softclix Lancets lancets USE TO CHECK CBG ONCE A DAY 100 each 0   atorvastatin (LIPITOR) 80 MG tablet Take 1 tablet (80 mg total) by mouth daily. 90 tablet 3   blood glucose meter kit and supplies Dispense based on patient and insurance preference. Check CBG once a day (FOR ICD-10 E10.9, E11.9). 1 each 0   esomeprazole (NEXIUM) 40 MG capsule Take 1 capsule by mouth once daily 90 capsule 1   famotidine (PEPCID) 40 MG tablet Take 1 tablet by mouth once daily 30 tablet 0   gabapentin (NEURONTIN) 300 MG capsule Take 1 capsule (300 mg total) by mouth at bedtime as needed (nerve pain in feet). 30 capsule 3   glucose blood (ACCU-CHEK GUIDE) test strip USE TO CHECK CBG ONCE A DAY 100  each 6   hydrochlorothiazide (HYDRODIURIL) 25 MG tablet Take 0.5 tablets (12.5 mg total) by mouth daily. Pt reports she takes half of 25 mg tablet 90 tablet 3   ibuprofen (ADVIL) 800 MG tablet Take by mouth. prn     losartan (COZAAR) 100 MG tablet Take 1 tablet (100 mg total) by mouth daily. 90 tablet 1   Multiple Vitamin (MULTI-VITAMIN) tablet Take by mouth.     Omega-3 Fatty Acids (FISH OIL PO) Take by mouth as directed.     Probiotic Product (PROBIOTIC DAILY PO) Take by mouth.     Semaglutide,0.25 or 0.'5MG'$ /DOS, 2 MG/3ML SOPN Inject 0.5 mg into the skin once a week. 3 mL 3   traZODone (DESYREL) 50 MG tablet Take 0.5-1 tablets (25-50 mg total) by mouth at bedtime as needed for sleep. 90 tablet 1   No current facility-administered medications on file prior to visit.   No Known Allergies Social History   Socioeconomic History   Marital status: Married    Spouse name: Not on file   Number of children: 3   Years of education: Not on file   Highest education level: Not on file  Occupational History   Not on file  Tobacco Use   Smoking status: Former   Smokeless  tobacco: Former    Quit date: 04/09/1978  Vaping Use   Vaping Use: Never used  Substance and Sexual Activity   Alcohol use: No   Drug use: No   Sexual activity: Never  Other Topics Concern   Not on file  Social History Narrative   Are you right handed or left handed? Right   Are you currently employed ? no   What is your current occupation?retired   Do you live at home alone? No    Who lives with you? husband   What type of home do you live in: 1 story or 2 story? one    Caffeine 1 soda a day    Social Determinants of Health   Financial Resource Strain: Low Risk  (04/17/2022)   Overall Financial Resource Strain (CARDIA)    Difficulty of Paying Living Expenses: Not hard at all  Food Insecurity: No Food Insecurity (04/17/2022)   Hunger Vital Sign    Worried About Running Out of Food in the Last Year: Never true    Ran  Out of Food in the Last Year: Never true  Transportation Needs: No Transportation Needs (04/17/2022)   PRAPARE - Hydrologist (Medical): No    Lack of Transportation (Non-Medical): No  Physical Activity: Insufficiently Active (04/17/2022)   Exercise Vital Sign    Days of Exercise per Week: 3 days    Minutes of Exercise per Session: 30 min  Stress: No Stress Concern Present (04/17/2022)   [place]    Feeling of Stress : Not at all  Social Connections: [place] (04/17/2022)   Social Connection and Isolation Panel [NHANES]    Frequency of Communication with Friends and Family: More than three times a week    Frequency of Social Gatherings with Friends and Family: Three times a week    Attends Religious Services: More than 4 times per year    Active Member of Clubs or Organizations: Yes    Attends Archivist Meetings: More than 4 times per year    Marital Status: Married  Human resources officer Violence: Not At Risk (04/17/2022)   Humiliation, Afraid, Rape, and Kick questionnaire    Fear of Current or Ex-Partner: No    Emotionally Abused: No    Physically Abused: No    Sexually Abused: No     Review of Systems  All other systems reviewed and are negative.      Objective:   Physical Exam Constitutional:      Appearance: Normal appearance. She is normal weight.  Cardiovascular:     Rate and Rhythm: Normal rate and regular rhythm.     Pulses: Normal pulses.     Heart sounds: Normal heart sounds. No murmur heard.    No friction rub. No gallop.  Pulmonary:     Effort: Pulmonary effort is normal. No respiratory distress.     Breath sounds: Normal breath sounds. No stridor.  Musculoskeletal:     Right knee: Effusion and crepitus present. No erythema, ecchymosis or lacerations. Decreased range of motion. Tenderness present over the medial joint line and lateral joint line. No MCL,  LCL, ACL or PCL tenderness. No LCL laxity, MCL laxity, ACL laxity or PCL laxity. Normal meniscus.     Right lower leg: No edema.     Left lower leg: No edema.  Neurological:     Mental Status: She is alert.  Assessment & Plan:  Acute pain of right knee Patient's history and exam are consistent with osteoarthritis.  I recommended trying Celebrex 200 mg a day for a week or 2 to see if this will help ease the pain in her knee.  She can also wear a knee sleeve.  If this is not improving the pain, she can return for an x-ray as well as a cortisone injection in the knee.  Cautioned the patient about GI toxicity from NSAIDs.  I chose Celebrex for this patient because she is on Protonix and famotidine for heartburn

## 2022-06-13 ENCOUNTER — Other Ambulatory Visit: Payer: Self-pay | Admitting: Nurse Practitioner

## 2022-06-13 DIAGNOSIS — A609 Anogenital herpesviral infection, unspecified: Secondary | ICD-10-CM

## 2022-06-13 NOTE — Telephone Encounter (Signed)
Requested medication (s) are due for refill today - no  Requested medication (s) are on the active medication list -no  Future visit scheduled -no  Last refill: 11/22/20  Notes to clinic: medication no longer on current medication list  Requested Prescriptions  Pending Prescriptions Disp Refills   valACYclovir (VALTREX) 1000 MG tablet [Pharmacy Med Name: valACYclovir HCl 1 GM Oral Tablet] 14 tablet 0    Sig: Take 1 tablet by mouth twice daily     Antimicrobials:  Antiviral Agents - Anti-Herpetic Passed - 06/13/2022  9:33 AM      Passed - Valid encounter within last 12 months    Recent Outpatient Visits           11 months ago Gastroesophageal reflux disease without esophagitis   [place] Pick[facility], Cammie Mcgee, MD   1 year ago Insomnia secondary to depression with anxiety   [place] Eulogio Bear, NP   1 year ago Encounter for annual wellness visit (AWV) in Medicare patient   [place], Jessica A, NP   1 year ago HSV (herpes simplex virus) anogenital infection   [place] Eulogio Bear, NP   1 year ago Controlled type 2 diabetes mellitus with neuropathy ([place])   [place] Eulogio Bear, NP                 Requested Prescriptions  Pending Prescriptions Disp Refills   valACYclovir (VALTREX) 1000 MG tablet [Pharmacy Med Name: valACYclovir HCl 1 GM Oral Tablet] 14 tablet 0    Sig: Take 1 tablet by mouth twice daily     Antimicrobials:  Antiviral Agents - Anti-Herpetic Passed - 06/13/2022  9:33 AM      Passed - Valid encounter within last 12 months    Recent Outpatient Visits           11 months ago Gastroesophageal reflux disease without esophagitis   [place] [facility], Cammie Mcgee, MD   1 year ago Insomnia secondary to depression with anxiety   [place] Eulogio Bear, NP   1 year ago Encounter for annual  wellness visit (AWV) in Medicare patient   [place] Eulogio Bear, NP   1 year ago HSV (herpes simplex virus) anogenital infection   [place] Eulogio Bear, NP   1 year ago Controlled type 2 diabetes mellitus with neuropathy ([place])   [place] Eulogio Bear, NP

## 2022-06-15 ENCOUNTER — Ambulatory Visit (INDEPENDENT_AMBULATORY_CARE_PROVIDER_SITE_OTHER): Payer: Medicare PPO | Admitting: Family Medicine

## 2022-06-15 ENCOUNTER — Encounter: Payer: Self-pay | Admitting: Family Medicine

## 2022-06-15 VITALS — BP 120/78 | HR 97 | Temp 98.4°F | Ht 65.0 in | Wt 177.0 lb

## 2022-06-15 DIAGNOSIS — A609 Anogenital herpesviral infection, unspecified: Secondary | ICD-10-CM | POA: Diagnosis not present

## 2022-06-15 MED ORDER — VALACYCLOVIR HCL 1 G PO TABS: 1000.0000 mg | ORAL_TABLET | Freq: Two times a day (BID) | ORAL | 5 refills | Status: AC

## 2022-06-15 NOTE — Telephone Encounter (Signed)
Patient called to follow up on refill; patient stated she's having a flareup and is requesting for refill to be sent in. Pharmacy hasn't received a refill request.   Appointment scheduled for today with Dr. Dennard Schaumann.   Pharmacy confirmed as   [facility] [address] [place]), [place] - [address] [facility] [place] [address] Phone: [phone]  Fax: [phone]   Please advise patient at [phone]

## 2022-06-15 NOTE — Progress Notes (Signed)
Subjective:    Patient ID: Nina Olson, female    DOB: 1945/12/19, 77 y.o.   MRN: [id number]  Patient is a 77 year old African-American female who has a history of herpes.  She typically gets outbreaks around her coccyx at the superior portion of her gluteal cleft.  She states about 1 week ago she had another outbreak.  There are 4 or 5 painful blisters on the skin over the coccyx.  There is no evidence of any secondary cellulitis.  I performed the exam with my nurse present.  There is no secondary cellulitis.  There is no drainage.  Past Medical History:  Diagnosis Date   Anemia    Arthritis    Colon polyps    GERD (gastroesophageal reflux disease)    High cholesterol    Hypertension    Osteopenia    Palpitations    Past Surgical History:  Procedure Laterality Date   CESAREAN SECTION     Current Outpatient Medications on File Prior to Visit  Medication Sig Dispense Refill   Accu-Chek Softclix Lancets lancets USE TO CHECK CBG ONCE A DAY 100 each 0   atorvastatin (LIPITOR) 80 MG tablet Take 1 tablet (80 mg total) by mouth daily. 90 tablet 3   blood glucose meter kit and supplies Dispense based on patient and insurance preference. Check CBG once a day (FOR ICD-10 E10.9, E11.9). 1 each 0   celecoxib (CELEBREX) 200 MG capsule Take 1 capsule (200 mg total) by mouth 2 (two) times daily. 30 capsule 1   esomeprazole (NEXIUM) 40 MG capsule Take 1 capsule by mouth once daily 90 capsule 1   famotidine (PEPCID) 40 MG tablet Take 1 tablet by mouth once daily 30 tablet 0   gabapentin (NEURONTIN) 300 MG capsule Take 1 capsule (300 mg total) by mouth at bedtime as needed (nerve pain in feet). 30 capsule 3   glucose blood (ACCU-CHEK GUIDE) test strip USE TO CHECK CBG ONCE A DAY 100 each 6   hydrochlorothiazide (HYDRODIURIL) 25 MG tablet Take 0.5 tablets (12.5 mg total) by mouth daily. Pt reports she takes half of 25 mg tablet 90 tablet 3   losartan (COZAAR) 100 MG tablet Take 1 tablet (100 mg  total) by mouth daily. 90 tablet 1   Multiple Vitamin (MULTI-VITAMIN) tablet Take by mouth.     Omega-3 Fatty Acids (FISH OIL PO) Take by mouth as directed.     Probiotic Product (PROBIOTIC DAILY PO) Take by mouth.     Semaglutide,0.25 or 0.'5MG'$ /DOS, 2 MG/3ML SOPN Inject 0.5 mg into the skin once a week. 3 mL 3   traZODone (DESYREL) 50 MG tablet Take 0.5-1 tablets (25-50 mg total) by mouth at bedtime as needed for sleep. 90 tablet 1   No current facility-administered medications on file prior to visit.   No Known Allergies Social History   Socioeconomic History   Marital status: Married    Spouse name: Not on file   Number of children: 3   Years of education: Not on file   Highest education level: Not on file  Occupational History   Not on file  Tobacco Use   Smoking status: Former   Smokeless tobacco: Former    Quit date: 04/09/1978  Vaping Use   Vaping Use: Never used  Substance and Sexual Activity   Alcohol use: No   Drug use: No   Sexual activity: Never  Other Topics Concern   Not on file  Social History Narrative   Are  you right handed or left handed? Right   Are you currently employed ? no   What is your current occupation?retired   Do you live at home alone? No    Who lives with you? husband   What type of home do you live in: 1 story or 2 story? one    Caffeine 1 soda a day    Social Determinants of Health   Financial Resource Strain: Low Risk  (04/17/2022)   Overall Financial Resource Strain (CARDIA)    Difficulty of Paying Living Expenses: Not hard at all  Food Insecurity: No Food Insecurity (04/17/2022)   Hunger Vital Sign    Worried About Running Out of Food in the Last Year: Never true    Ran Out of Food in the Last Year: Never true  Transportation Needs: No Transportation Needs (04/17/2022)   PRAPARE - Hydrologist (Medical): No    Lack of Transportation (Non-Medical): No  Physical Activity: Insufficiently Active (04/17/2022)    Exercise Vital Sign    Days of Exercise per Week: 3 days    Minutes of Exercise per Session: 30 min  Stress: No Stress Concern Present (04/17/2022)   [place]    Feeling of Stress : Not at all  Social Connections: [place] (04/17/2022)   Social Connection and Isolation Panel [NHANES]    Frequency of Communication with Friends and Family: More than three times a week    Frequency of Social Gatherings with Friends and Family: Three times a week    Attends Religious Services: More than 4 times per year    Active Member of Clubs or Organizations: Yes    Attends Archivist Meetings: More than 4 times per year    Marital Status: Married  Human resources officer Violence: Not At Risk (04/17/2022)   Humiliation, Afraid, Rape, and Kick questionnaire    Fear of Current or Ex-Partner: No    Emotionally Abused: No    Physically Abused: No    Sexually Abused: No     Review of Systems  All other systems reviewed and are negative.      Objective:   Physical Exam Constitutional:      Appearance: Normal appearance. She is normal weight.  Cardiovascular:     Rate and Rhythm: Normal rate and regular rhythm.     Pulses: Normal pulses.     Heart sounds: Normal heart sounds. No murmur heard.    No friction rub. No gallop.  Pulmonary:     Effort: Pulmonary effort is normal. No respiratory distress.     Breath sounds: Normal breath sounds. No stridor.  Musculoskeletal:     Right knee: Effusion and crepitus present. No erythema, ecchymosis or lacerations. Decreased range of motion. Tenderness present over the medial joint line and lateral joint line. No MCL, LCL, ACL or PCL tenderness. No LCL laxity, MCL laxity, ACL laxity or PCL laxity. Normal meniscus.     Right lower leg: No edema.     Left lower leg: No edema.       Legs:  Neurological:     Mental Status: She is alert.           Assessment & Plan:  HSV  (herpes simplex virus) anogenital infection Treat the outbreak with Valtrex 1 g p.o. twice daily for 7 days.  We discussed prophylactic dosage of Valtrex to prevent outbreaks in the future.  No evidence of secondary cellulitis.  Continues to deal with knee pain.  Recommended a cortisone injection if no better once the infection has resolved.

## 2022-06-19 ENCOUNTER — Ambulatory Visit
Admission: RE | Admit: 2022-06-19 | Discharge: 2022-06-19 | Disposition: A | Discharge status: Home / Self Care | Payer: Medicare PPO | Source: Ambulatory Visit | Attending: Family Medicine | Admitting: Family Medicine

## 2022-06-19 DIAGNOSIS — Z1231 Encounter for screening mammogram for malignant neoplasm of breast: Secondary | ICD-10-CM

## 2022-06-20 ENCOUNTER — Encounter: Payer: Self-pay | Admitting: Neurology

## 2022-07-02 DIAGNOSIS — L811 Chloasma: Secondary | ICD-10-CM | POA: Diagnosis not present

## 2022-07-02 DIAGNOSIS — L209 Atopic dermatitis, unspecified: Secondary | ICD-10-CM | POA: Diagnosis not present

## 2022-07-03 ENCOUNTER — Other Ambulatory Visit: Payer: Self-pay | Admitting: Family Medicine

## 2022-07-03 DIAGNOSIS — K219 Gastro-esophageal reflux disease without esophagitis: Secondary | ICD-10-CM

## 2022-07-05 ENCOUNTER — Other Ambulatory Visit: Payer: Medicare HMO

## 2022-07-06 ENCOUNTER — Other Ambulatory Visit: Payer: Self-pay | Admitting: Family Medicine

## 2022-07-06 DIAGNOSIS — K219 Gastro-esophageal reflux disease without esophagitis: Secondary | ICD-10-CM

## 2022-07-31 LAB — GLUCOSE, POCT (MANUAL RESULT ENTRY): Glucose Fasting, POC: 111 mg/dL — AB (ref 70–99)

## 2022-07-31 NOTE — Progress Notes (Signed)
Pt has PCP and no SDOH concerns.

## 2022-08-07 ENCOUNTER — Other Ambulatory Visit: Payer: Self-pay | Admitting: Family Medicine

## 2022-08-07 DIAGNOSIS — K219 Gastro-esophageal reflux disease without esophagitis: Secondary | ICD-10-CM

## 2022-08-08 NOTE — Telephone Encounter (Signed)
Due to computer glitch protocol does not indicate correct office visit dates.   LOV 06/15/2022.   Has appt for 08/31/2022 so 90 day supply given.  Requested Prescriptions  Pending Prescriptions Disp Refills   famotidine (PEPCID) 40 MG tablet [Pharmacy Med Name: Famotidine 40 MG Oral Tablet] 90 tablet 0    Sig: Take 1 tablet by mouth once daily     Gastroenterology:  H2 Antagonists Failed - 08/07/2022  4:58 PM      Failed - Valid encounter within last 12 months    Recent Outpatient Visits           1 year ago Gastroesophageal reflux disease without esophagitis   [facility] Donita Brooks, MD   1 year ago Insomnia secondary to depression with anxiety   [facility] Valentino Nose, NP   1 year ago Encounter for annual wellness visit (AWV) in Medicare patient   [facility] Valentino Nose, NP   1 year ago HSV (herpes simplex virus) anogenital infection   [facility] Valentino Nose, NP   1 year ago Controlled type 2 diabetes mellitus with neuropathy [facility])   [facility] Valentino Nose, NP

## 2022-08-16 DIAGNOSIS — M25661 Stiffness of right knee, not elsewhere classified: Secondary | ICD-10-CM | POA: Diagnosis not present

## 2022-08-16 DIAGNOSIS — M25561 Pain in right knee: Secondary | ICD-10-CM | POA: Diagnosis not present

## 2022-08-16 DIAGNOSIS — R262 Difficulty in walking, not elsewhere classified: Secondary | ICD-10-CM | POA: Diagnosis not present

## 2022-08-16 DIAGNOSIS — M1711 Unilateral primary osteoarthritis, right knee: Secondary | ICD-10-CM | POA: Diagnosis not present

## 2022-08-18 ENCOUNTER — Other Ambulatory Visit: Payer: Self-pay | Admitting: Family Medicine

## 2022-08-18 DIAGNOSIS — I10 Essential (primary) hypertension: Secondary | ICD-10-CM

## 2022-08-20 ENCOUNTER — Encounter: Payer: Self-pay | Admitting: *Deleted

## 2022-08-20 NOTE — Progress Notes (Signed)
Pt attended 07/31/22 screening event where b/p was 112/79 and blood sugar was 111. At the event, pt confirmed her PCP was Lynnea Ferrier, [facility], at [facility] and did not identify any SDOH insecurities. Pt saw PCP on 06/15/22 and has a future appt on 08/31/22. No additional health equity team support indicated at this time.

## 2022-08-20 NOTE — Telephone Encounter (Signed)
Requested medication (s) are due for refill today- expired Rx  Requested medication (s) are on the active medication list -yes  Future visit scheduled -no  Last refill: 08/07/21 #90 1RF  Notes to clinic: expired Rx, last OV 06/04/22  Requested Prescriptions  Pending Prescriptions Disp Refills   losartan (COZAAR) 100 MG tablet [Pharmacy Med Name: Losartan Potassium 100 MG Oral Tablet] 90 tablet 0    Sig: Take 1 tablet by mouth once daily     Cardiovascular:  Angiotensin Receptor Blockers Failed - 08/18/2022  6:50 AM      Failed - Valid encounter within last 6 months    Recent Outpatient Visits           1 year ago Gastroesophageal reflux disease without esophagitis   [facility] Donita Brooks, MD   1 year ago Insomnia secondary to depression with anxiety   [facility] Valentino Nose, NP   1 year ago Encounter for annual wellness visit (AWV) in Medicare patient   [facility] Valentino Nose, NP   1 year ago HSV (herpes simplex virus) anogenital infection   [facility] Valentino Nose, NP   1 year ago Controlled type 2 diabetes mellitus with neuropathy (HCC)   [facility] Valentino Nose, NP              Passed - Cr in normal range and within 180 days    Creat  Date Value Ref Range Status  06/04/2022 0.91 0.60 - 1.00 mg/dL Final   Creatinine, Urine  Date Value Ref Range Status  06/04/2022 209 20 - 275 mg/dL Final         Passed - K in normal range and within 180 days    Potassium  Date Value Ref Range Status  06/04/2022 3.8 3.5 - 5.3 mmol/L Final         Passed - Patient is not pregnant      Passed - Last BP in normal range    BP Readings from Last 1 Encounters:  07/31/22 112/79            Requested Prescriptions  Pending Prescriptions Disp Refills   losartan (COZAAR) 100 MG tablet [Pharmacy Med Name: Losartan Potassium 100 MG Oral Tablet] 90 tablet 0     Sig: Take 1 tablet by mouth once daily     Cardiovascular:  Angiotensin Receptor Blockers Failed - 08/18/2022  6:50 AM      Failed - Valid encounter within last 6 months    Recent Outpatient Visits           1 year ago Gastroesophageal reflux disease without esophagitis   [facility] Donita Brooks, MD   1 year ago Insomnia secondary to depression with anxiety   [facility] Valentino Nose, NP   1 year ago Encounter for annual wellness visit (AWV) in Medicare patient   [facility] Valentino Nose, NP   1 year ago HSV (herpes simplex virus) anogenital infection   [facility] Valentino Nose, NP   1 year ago Controlled type 2 diabetes mellitus with neuropathy [facility]   [facility] Valentino Nose, NP              Passed - Cr in normal range and within 180 days    Creat  Date Value Ref Range Status  06/04/2022 0.91 0.60 -  1.00 mg/dL Final   Creatinine, Urine  Date Value Ref Range Status  06/04/2022 209 20 - 275 mg/dL Final         Passed - K in normal range and within 180 days    Potassium  Date Value Ref Range Status  06/04/2022 3.8 3.5 - 5.3 mmol/L Final         Passed - Patient is not pregnant      Passed - Last BP in normal range    BP Readings from Last 1 Encounters:  07/31/22 112/79

## 2022-08-23 DIAGNOSIS — M1711 Unilateral primary osteoarthritis, right knee: Secondary | ICD-10-CM | POA: Diagnosis not present

## 2022-08-23 DIAGNOSIS — R262 Difficulty in walking, not elsewhere classified: Secondary | ICD-10-CM | POA: Diagnosis not present

## 2022-08-23 DIAGNOSIS — M25661 Stiffness of right knee, not elsewhere classified: Secondary | ICD-10-CM | POA: Diagnosis not present

## 2022-08-23 DIAGNOSIS — M25561 Pain in right knee: Secondary | ICD-10-CM | POA: Diagnosis not present

## 2022-08-29 DIAGNOSIS — M62830 Muscle spasm of back: Secondary | ICD-10-CM | POA: Diagnosis not present

## 2022-08-29 DIAGNOSIS — M545 Low back pain, unspecified: Secondary | ICD-10-CM | POA: Diagnosis not present

## 2022-08-29 DIAGNOSIS — M2569 Stiffness of other specified joint, not elsewhere classified: Secondary | ICD-10-CM | POA: Diagnosis not present

## 2022-08-31 ENCOUNTER — Encounter: Payer: Self-pay | Admitting: Family Medicine

## 2022-08-31 ENCOUNTER — Ambulatory Visit (INDEPENDENT_AMBULATORY_CARE_PROVIDER_SITE_OTHER): Payer: Medicare HMO | Admitting: Family Medicine

## 2022-08-31 VITALS — BP 114/62 | HR 103 | Temp 98.4°F | Ht 65.0 in | Wt 172.4 lb

## 2022-08-31 DIAGNOSIS — M1711 Unilateral primary osteoarthritis, right knee: Secondary | ICD-10-CM | POA: Diagnosis not present

## 2022-08-31 DIAGNOSIS — M25561 Pain in right knee: Secondary | ICD-10-CM | POA: Diagnosis not present

## 2022-08-31 DIAGNOSIS — I10 Essential (primary) hypertension: Secondary | ICD-10-CM

## 2022-08-31 DIAGNOSIS — E7800 Pure hypercholesterolemia, unspecified: Secondary | ICD-10-CM

## 2022-08-31 DIAGNOSIS — E1140 Type 2 diabetes mellitus with diabetic neuropathy, unspecified: Secondary | ICD-10-CM | POA: Diagnosis not present

## 2022-08-31 DIAGNOSIS — Z7985 Long-term (current) use of injectable non-insulin antidiabetic drugs: Secondary | ICD-10-CM

## 2022-08-31 DIAGNOSIS — M25661 Stiffness of right knee, not elsewhere classified: Secondary | ICD-10-CM | POA: Diagnosis not present

## 2022-08-31 DIAGNOSIS — R262 Difficulty in walking, not elsewhere classified: Secondary | ICD-10-CM | POA: Diagnosis not present

## 2022-08-31 MED ORDER — SEMAGLUTIDE (1 MG/DOSE) 4 MG/3ML ~~LOC~~ SOPN
1.0000 mg | PEN_INJECTOR | SUBCUTANEOUS | 2 refills | Status: DC
Start: 2022-08-31 — End: 2023-04-16

## 2022-08-31 NOTE — Progress Notes (Signed)
Subjective:    Patient ID: Nina Olson, female    DOB: 1945-12-13, 77 y.o.   MRN: [id number]  HPI Patient is a very pleasant 77 year old African-American female here today for follow up of her diabetes.  Last HgA1c was 7.3 in February and we added semaglutide.  LDL at that time was 113.  Currently on atorvastatin 80 mg poqday.   Wt Readings from Last 3 Encounters:  08/31/22 172 lb 6.4 oz (78.2 kg)  06/15/22 177 lb (80.3 kg)  06/11/22 175 lb (79.4 kg)  Patient has lost 5 pounds since starting Ozempic.  She denies any nausea or vomiting.  She denies any chest pain or shortness of breath or dyspnea on exertion.  She does have some dizziness.  Her heart rate is slightly elevated today and she appears slightly dehydrated.  She admits that she is not drinking enough water.  Otherwise she is doing well with no concerns.  She denies any polyuria polydipsia or blurry vision.  She denies any headache or neurologic deficit.  She denies any vertigo. Past Medical History:  Diagnosis Date   Anemia    Arthritis    Colon polyps    GERD (gastroesophageal reflux disease)    High cholesterol    Hypertension    Osteopenia    Palpitations    Past Surgical History:  Procedure Laterality Date   CESAREAN SECTION     Current Outpatient Medications on File Prior to Visit  Medication Sig Dispense Refill   Accu-Chek Softclix Lancets lancets USE TO CHECK CBG ONCE A DAY 100 each 0   atorvastatin (LIPITOR) 80 MG tablet Take 1 tablet (80 mg total) by mouth daily. 90 tablet 3   blood glucose meter kit and supplies Dispense based on patient and insurance preference. Check CBG once a day (FOR ICD-10 E10.9, E11.9). 1 each 0   celecoxib (CELEBREX) 200 MG capsule Take 1 capsule (200 mg total) by mouth 2 (two) times daily. 30 capsule 1   esomeprazole (NEXIUM) 40 MG capsule Take 1 capsule by mouth once daily 90 capsule 0   famotidine (PEPCID) 40 MG tablet Take 1 tablet by mouth once daily 90 tablet 0   gabapentin  (NEURONTIN) 300 MG capsule Take 1 capsule (300 mg total) by mouth at bedtime as needed (nerve pain in feet). 30 capsule 3   glucose blood (ACCU-CHEK GUIDE) test strip USE TO CHECK CBG ONCE A DAY 100 each 6   hydrochlorothiazide (HYDRODIURIL) 25 MG tablet Take 0.5 tablets (12.5 mg total) by mouth daily. Pt reports she takes half of 25 mg tablet 90 tablet 3   losartan (COZAAR) 100 MG tablet Take 1 tablet by mouth once daily 90 tablet 0   Multiple Vitamin (MULTI-VITAMIN) tablet Take by mouth.     Omega-3 Fatty Acids (FISH OIL PO) Take by mouth as directed.     Probiotic Product (PROBIOTIC DAILY PO) Take by mouth.     Semaglutide,0.25 or 0.5MG /DOS, 2 MG/3ML SOPN Inject 0.5 mg into the skin once a week. 3 mL 3   traZODone (DESYREL) 50 MG tablet Take 0.5-1 tablets (25-50 mg total) by mouth at bedtime as needed for sleep. 90 tablet 1   valACYclovir (VALTREX) 1000 MG tablet Take 1 tablet (1,000 mg total) by mouth 2 (two) times daily. 14 tablet 5   No current facility-administered medications on file prior to visit.   No Known Allergies Social History   Socioeconomic History   Marital status: Married    Spouse name:  Not on file   Number of children: 3   Years of education: Not on file   Highest education level: Not on file  Occupational History   Not on file  Tobacco Use   Smoking status: Former   Smokeless tobacco: Former    Quit date: 04/09/1978  Vaping Use   Vaping Use: Never used  Substance and Sexual Activity   Alcohol use: No   Drug use: No   Sexual activity: Never  Other Topics Concern   Not on file  Social History Narrative   Are you right handed or left handed? Right   Are you currently employed ? no   What is your current occupation?retired   Do you live at home alone? No    Who lives with you? husband   What type of home do you live in: 1 story or 2 story? one    Caffeine 1 soda a day    Social Determinants of Health   Financial Resource Strain: Low Risk  (04/17/2022)    Overall Financial Resource Strain (CARDIA)    Difficulty of Paying Living Expenses: Not hard at all  Food Insecurity: No Food Insecurity (07/31/2022)   Hunger Vital Sign    Worried About Running Out of Food in the Last Year: Never true    Ran Out of Food in the Last Year: Never true  Transportation Needs: No Transportation Needs (07/31/2022)   PRAPARE - Administrator, Civil Service (Medical): No    Lack of Transportation (Non-Medical): No  Physical Activity: Insufficiently Active (04/17/2022)   Exercise Vital Sign    Days of Exercise per Week: 3 days    Minutes of Exercise per Session: 30 min  Stress: No Stress Concern Present (04/17/2022)   [employer] of Occupational Health - Occupational Stress Questionnaire    Feeling of Stress : Not at all  Social Connections: Socially Integrated (04/17/2022)   Social Connection and Isolation Panel [NHANES]    Frequency of Communication with Friends and Family: More than three times a week    Frequency of Social Gatherings with Friends and Family: Three times a week    Attends Religious Services: More than 4 times per year    Active Member of Clubs or Organizations: Yes    Attends Banker Meetings: More than 4 times per year    Marital Status: Married  Catering manager Violence: Not At Risk (07/31/2022)   Humiliation, Afraid, Rape, and Kick questionnaire    Fear of Current or Ex-Partner: No    Emotionally Abused: No    Physically Abused: No    Sexually Abused: No     Review of Systems  All other systems reviewed and are negative.      Objective:   Physical Exam Constitutional:      Appearance: Normal appearance. She is normal weight.  HENT:     Right Ear: Tympanic membrane, ear canal and external ear normal.     Left Ear: Tympanic membrane, ear canal and external ear normal.     Mouth/Throat:     Mouth: Mucous membranes are moist.     Pharynx: No oropharyngeal exudate or posterior oropharyngeal erythema.   Eyes:     General: No scleral icterus.    Extraocular Movements: Extraocular movements intact.     Conjunctiva/sclera: Conjunctivae normal.     Pupils: Pupils are equal, round, and reactive to light.  Neck:     Vascular: No carotid bruit.  Cardiovascular:  Rate and Rhythm: Normal rate and regular rhythm.     Pulses: Normal pulses.     Heart sounds: Normal heart sounds. No murmur heard.    No friction rub. No gallop.  Pulmonary:     Effort: Pulmonary effort is normal. No respiratory distress.     Breath sounds: Normal breath sounds. No stridor. No wheezing, rhonchi or rales.  Abdominal:     General: Bowel sounds are normal. There is no distension.     Palpations: Abdomen is soft. There is no mass.     Tenderness: There is no abdominal tenderness. There is no guarding or rebound.  Musculoskeletal:     Cervical back: Normal range of motion and neck supple.     Right lower leg: No edema.     Left lower leg: No edema.  Lymphadenopathy:     Cervical: No cervical adenopathy.  Skin:    Findings: No erythema or rash.  Neurological:     General: No focal deficit present.     Mental Status: She is alert and oriented to person, place, and time.     Cranial Nerves: No cranial nerve deficit.     Sensory: No sensory deficit.     Motor: No weakness.     Coordination: Coordination normal.     Gait: Gait normal.     Deep Tendon Reflexes: Reflexes normal.  Psychiatric:        Mood and Affect: Mood normal.        Behavior: Behavior normal.        Thought Content: Thought content normal.        Judgment: Judgment normal.           Assessment & Plan:  Controlled type 2 diabetes mellitus with neuropathy (HCC) - Plan: CBC with Differential/Platelet, COMPLETE METABOLIC PANEL WITH GFR, Lipid panel, Hemoglobin A1c, Protein / Creatinine Ratio, Urine  Essential hypertension  Pure hypercholesterolemia I recommended that the patient try to drink more water.  I am concerned that she may be  dehydrated.  We will also increase Ozempic to 1 mg subcu weekly to try to facilitate additional weight loss.  Check CBC the lipid panel and A1c today.  Goal LDL cholesterol is less than 100.  Goal A1c is less than 6.5.  Monitor urine protein to creatinine ratio

## 2022-09-01 LAB — CBC WITH DIFFERENTIAL/PLATELET
Absolute Monocytes: 626 cells/uL (ref 200–950)
Basophils Absolute: 18 cells/uL (ref 0–200)
Basophils Relative: 0.4 %
Eosinophils Absolute: 243 cells/uL (ref 15–500)
Eosinophils Relative: 5.4 %
HCT: 37.1 % (ref 35.0–45.0)
Hemoglobin: 11.9 g/dL (ref 11.7–15.5)
Lymphs Abs: 1274 cells/uL (ref 850–3900)
MCH: 28.2 pg (ref 27.0–33.0)
MCHC: 32.1 g/dL (ref 32.0–36.0)
MCV: 87.9 fL (ref 80.0–100.0)
MPV: 9.5 fL (ref 7.5–12.5)
Monocytes Relative: 13.9 %
Neutro Abs: 2340 cells/uL (ref 1500–7800)
Neutrophils Relative %: 52 %
Platelets: 270 10*3/uL (ref 140–400)
RBC: 4.22 10*6/uL (ref 3.80–5.10)
RDW: 14.3 % (ref 11.0–15.0)
Total Lymphocyte: 28.3 %
WBC: 4.5 10*3/uL (ref 3.8–10.8)

## 2022-09-01 LAB — LIPID PANEL
Cholesterol: 120 mg/dL (ref <200)
HDL: 36 mg/dL — ABNORMAL LOW (ref >=50)
LDL Cholesterol (Calc): 68 mg/dL (calc)
Non-HDL Cholesterol (Calc): 84 mg/dL (calc) (ref <130)
Total CHOL/HDL Ratio: 3.3 (calc) (ref <5.0)
Triglycerides: 80 mg/dL (ref <150)

## 2022-09-01 LAB — COMPLETE METABOLIC PANEL WITH GFR
AG Ratio: 1.4 (calc) (ref 1.0–2.5)
ALT: 13 U/L (ref 6–29)
AST: 13 U/L (ref 10–35)
Albumin: 4 g/dL (ref 3.6–5.1)
Alkaline phosphatase (APISO): 105 U/L (ref 37–153)
BUN/Creatinine Ratio: 13 (calc) (ref 6–22)
BUN: 13 mg/dL (ref 7–25)
CO2: 29 mmol/L (ref 20–32)
Calcium: 9.4 mg/dL (ref 8.6–10.4)
Chloride: 108 mmol/L (ref 98–110)
Creat: 1.01 mg/dL — ABNORMAL HIGH (ref 0.60–1.00)
Globulin: 2.8 g/dL (calc) (ref 1.9–3.7)
Glucose, Bld: 94 mg/dL (ref 65–99)
Potassium: 4.5 mmol/L (ref 3.5–5.3)
Sodium: 145 mmol/L (ref 135–146)
Total Bilirubin: 0.5 mg/dL (ref 0.2–1.2)
Total Protein: 6.8 g/dL (ref 6.1–8.1)
eGFR: 58 mL/min/{1.73_m2} — ABNORMAL LOW (ref >=60)

## 2022-09-01 LAB — HEMOGLOBIN A1C
Hgb A1c MFr Bld: 6.5 % of total Hgb — ABNORMAL HIGH (ref <5.7)
Mean Plasma Glucose: 140 mg/dL
eAG (mmol/L): 7.7 mmol/L

## 2022-09-01 LAB — TIQ-MISC

## 2022-09-01 LAB — PROTEIN / CREATININE RATIO, URINE

## 2022-09-07 DIAGNOSIS — M25661 Stiffness of right knee, not elsewhere classified: Secondary | ICD-10-CM | POA: Diagnosis not present

## 2022-09-07 DIAGNOSIS — M25561 Pain in right knee: Secondary | ICD-10-CM | POA: Diagnosis not present

## 2022-09-07 DIAGNOSIS — M1711 Unilateral primary osteoarthritis, right knee: Secondary | ICD-10-CM | POA: Diagnosis not present

## 2022-09-07 DIAGNOSIS — R262 Difficulty in walking, not elsewhere classified: Secondary | ICD-10-CM | POA: Diagnosis not present

## 2022-09-14 DIAGNOSIS — M25561 Pain in right knee: Secondary | ICD-10-CM | POA: Diagnosis not present

## 2022-09-14 DIAGNOSIS — R262 Difficulty in walking, not elsewhere classified: Secondary | ICD-10-CM | POA: Diagnosis not present

## 2022-09-14 DIAGNOSIS — M25661 Stiffness of right knee, not elsewhere classified: Secondary | ICD-10-CM | POA: Diagnosis not present

## 2022-09-14 DIAGNOSIS — M1711 Unilateral primary osteoarthritis, right knee: Secondary | ICD-10-CM | POA: Diagnosis not present

## 2022-09-21 DIAGNOSIS — M25561 Pain in right knee: Secondary | ICD-10-CM | POA: Diagnosis not present

## 2022-09-21 DIAGNOSIS — M25661 Stiffness of right knee, not elsewhere classified: Secondary | ICD-10-CM | POA: Diagnosis not present

## 2022-09-21 DIAGNOSIS — R262 Difficulty in walking, not elsewhere classified: Secondary | ICD-10-CM | POA: Diagnosis not present

## 2022-09-21 DIAGNOSIS — M1711 Unilateral primary osteoarthritis, right knee: Secondary | ICD-10-CM | POA: Diagnosis not present

## 2022-09-28 ENCOUNTER — Other Ambulatory Visit: Payer: Self-pay

## 2022-09-28 DIAGNOSIS — K219 Gastro-esophageal reflux disease without esophagitis: Secondary | ICD-10-CM

## 2022-09-28 MED ORDER — ESOMEPRAZOLE MAGNESIUM 40 MG PO CPDR
40.0000 mg | DELAYED_RELEASE_CAPSULE | Freq: Every day | ORAL | 0 refills | Status: DC
Start: 2022-09-28 — End: 2022-10-08

## 2022-09-28 NOTE — Telephone Encounter (Signed)
Requested Prescriptions  Pending Prescriptions Disp Refills   esomeprazole (NEXIUM) 40 MG capsule 90 capsule 0    Sig: Take 1 capsule (40 mg total) by mouth daily.     Gastroenterology: Proton Pump Inhibitors 2 Failed - 09/28/2022  1:51 PM      Failed - Valid encounter within last 12 months    Recent Outpatient Visits           1 year ago Gastroesophageal reflux disease without esophagitis   [facility] Tanya Nones, Priscille Heidelberg, MD   1 year ago Insomnia secondary to depression with anxiety   [facility] Valentino Nose, NP   1 year ago Encounter for annual wellness visit (AWV) in Medicare patient   [facility] Valentino Nose, NP   1 year ago HSV (herpes simplex virus) anogenital infection   [facility] Valentino Nose, NP   1 year ago Controlled type 2 diabetes mellitus with neuropathy [facility])   [facility] Valentino Nose, NP              Passed - ALT in normal range and within 360 days    ALT  Date Value Ref Range Status  08/31/2022 13 6 - 29 U/L Final         Passed - AST in normal range and within 360 days    AST  Date Value Ref Range Status  08/31/2022 13 10 - 35 U/L Final

## 2022-09-28 NOTE — Telephone Encounter (Signed)
Prescription Request  09/28/2022  LOV: 08/31/22  What is the name of the medication or equipment? esomeprazole (NEXIUM) 40 MG capsule [[id number]]  Have you contacted your pharmacy to request a refill? Yes   Which pharmacy would you like this sent to?  [facility] - [place], [place] - [address] [place] [address] Phone: [phone] Fax: [phone]    Patient notified that their request is being sent to the clinical staff for review and that they should receive a response within 2 business days.   Please advise at [facility] [phone]

## 2022-10-02 DIAGNOSIS — L209 Atopic dermatitis, unspecified: Secondary | ICD-10-CM | POA: Diagnosis not present

## 2022-10-02 DIAGNOSIS — L811 Chloasma: Secondary | ICD-10-CM | POA: Diagnosis not present

## 2022-10-05 ENCOUNTER — Other Ambulatory Visit: Payer: Self-pay | Admitting: Family Medicine

## 2022-10-05 DIAGNOSIS — K219 Gastro-esophageal reflux disease without esophagitis: Secondary | ICD-10-CM

## 2022-10-08 NOTE — Telephone Encounter (Signed)
Pt request med to be sent  to local pharmacy  Requested Prescriptions  Pending Prescriptions Disp Refills   esomeprazole (NEXIUM) 40 MG capsule [Pharmacy Med Name: Esomeprazole Magnesium 40 MG Oral Capsule Delayed Release] 90 capsule 0    Sig: Take 1 capsule by mouth once daily     Gastroenterology: Proton Pump Inhibitors 2 Failed - 10/05/2022  6:50 AM      Failed - Valid encounter within last 12 months    Recent Outpatient Visits           1 year ago Gastroesophageal reflux disease without esophagitis   [facility] Donita Brooks, MD   1 year ago Insomnia secondary to depression with anxiety   [facility] Valentino Nose, NP   1 year ago Encounter for annual wellness visit (AWV) in Medicare patient   [facility] Valentino Nose, NP   1 year ago HSV (herpes simplex virus) anogenital infection   [facility] Valentino Nose, NP   1 year ago Controlled type 2 diabetes mellitus with neuropathy [facility])   [facility] Valentino Nose, NP              Passed - ALT in normal range and within 360 days    ALT  Date Value Ref Range Status  08/31/2022 13 6 - 29 U/L Final         Passed - AST in normal range and within 360 days    AST  Date Value Ref Range Status  08/31/2022 13 10 - 35 U/L Final

## 2022-10-09 NOTE — Progress Notes (Deleted)
I saw Nina Olson in neurology clinic on 10/17/22 in follow up for tingling in bilateral feet.  HPI: Nina Olson is a 77 y.o. year old female with a history of DM, HTN, HLD, GERD, former smoker who we last saw on 04/26/22.  To briefly review: Initial consult 04/26/22: Symptoms started about 2 years ago (2022) in bilateral feet with tingling. It would feel like she was "walking on cardboard." She saw podiatry who felt she may have a neuroma as she was having particular pain in right great toe. She got an injection that helped a little, but then symptoms returned. Over the last 2 months, her feet have felt like they are tightening (left > right). She denies significant cramps. She sometimes feels off balance, but denies any falls. She endorses back pain and shooting pains from back into buttocks into legs (calling it sciatica). It is also an achy sensation. If she moves in certain ways, it can "grab" her and make it painful to move in certain way. She has never been given medication for her symptoms. She uses over the counter icy hot like things that help with the back. She has never used anything on her feet.   She does not have significant symptoms in her hands. She will occasionally wake up with a hand tingling. Of note, patient broke her left hand 25 years ago and has some residual deformity.   Patient also mentions that her right knee has been hurting for about a month and that she has swelling on the medial aspect of her right knee.   Of note, patient is on B complex and zinc for 2-3 years.   Podiatry had mentioned EMG to the patient, but she has not had this or scheduled it at this point.   She does not report any constitutional symptoms like fever, night sweats, anorexia or unintentional weight loss.   EtOH use: No, has not drank much since 1980s Restrictive diet? No Family history of neuropathy/myopathy/NM disease? 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 Her risk factors include diabetes. I will send labs to look for other treatable causes.   PLAN: -Blood work: IFE, B6, copper, zinc -Stop B complex and zinc supplementation -Discussed EMG, will defer for now due to mild nature of symptoms -Patient to discuss diabetes treatment with PCP -Recommend Lidocaine cream PRN -Recommend alpha lipoic acid 600 mg once or twice daily -Discussed prescription medication for tingling, but patient would prefer to try over the counter options first   -Consult to dermatology for irregular looking mole on right anterior lower leg.   -Return to clinic in 6 months  Since their last visit: Labs sent on 04/26/22 were normal. I recommended she stop vit B6 and zinc supplementation as these were normal.***   HbA1c on 08/31/22 was 6.5 (improved from 7.3).   She was prescribed gabapentin 300 mg at bedtime for pain in 05/2022.***  Dermatology to look at mole?***  ROS: Pertinent positive and negative systems reviewed in HPI. ***   MEDICATIONS:  Outpatient Encounter Medications as of 10/17/2022  Medication Sig   Accu-Chek Softclix Lancets lancets USE TO CHECK CBG ONCE A DAY   atorvastatin (LIPITOR) 80 MG tablet Take 1 tablet (80 mg total) by mouth daily.   blood glucose meter kit and supplies Dispense based on patient and insurance preference. Check CBG once a day (FOR ICD-10  E10.9, E11.9).   esomeprazole (NEXIUM) 40 MG capsule Take 1 capsule by mouth once daily   famotidine (PEPCID) 40 MG tablet Take 1 tablet by mouth once daily   gabapentin (NEURONTIN) 300 MG capsule Take 1 capsule (300 mg total) by mouth at bedtime as needed (nerve pain in feet).   glucose blood (ACCU-CHEK GUIDE) test strip USE TO CHECK CBG ONCE A DAY   hydrochlorothiazide (HYDRODIURIL) 25 MG tablet Take 0.5 tablets (12.5 mg total) by mouth daily. Pt reports she takes half of 25 mg tablet    losartan (COZAAR) 100 MG tablet Take 1 tablet by mouth once daily   Multiple Vitamin (MULTI-VITAMIN) tablet Take by mouth.   Omega-3 Fatty Acids (FISH OIL PO) Take by mouth as directed.   Probiotic Product (PROBIOTIC DAILY PO) Take by mouth.   Semaglutide, 1 MG/DOSE, 4 MG/3ML SOPN Inject 1 mg as directed once a week.   traZODone (DESYREL) 50 MG tablet Take 0.5-1 tablets (25-50 mg total) by mouth at bedtime as needed for sleep.   valACYclovir (VALTREX) 1000 MG tablet Take 1 tablet (1,000 mg total) by mouth 2 (two) times daily.   No facility-administered encounter medications on file as of 10/17/2022.    PAST MEDICAL HISTORY: Past Medical History:  Diagnosis Date   Anemia    Arthritis    Colon polyps    GERD (gastroesophageal reflux disease)    High cholesterol    Hypertension    Osteopenia    Palpitations     PAST SURGICAL HISTORY: Past Surgical History:  Procedure Laterality Date   CESAREAN SECTION      ALLERGIES: No Known Allergies  FAMILY HISTORY: Family History  Problem Relation Age of Onset   Hypertension Mother    Osteoporosis Mother    Breast cancer Neg Hx     SOCIAL HISTORY: Social History   Tobacco Use   Smoking status: Former   Smokeless tobacco: Former    Quit date: 04/09/1978  Vaping Use   Vaping Use: Never used  Substance Use Topics   Alcohol use: No   Drug use: No   Social History   Social History Narrative   Are you right handed or left handed? Right   Are you currently employed ? no   What is your current occupation?retired   Do you live at home alone? No    Who lives with you? husband   What type of home do you live in: 1 story or 2 story? one    Caffeine 1 soda a day     Objective:  Vital Signs:  There were no vitals taken for this visit.  General:*** General appearance: Awake and alert. No distress. Cooperative with exam.  Skin: No obvious rash or jaundice. HEENT: Atraumatic. Anicteric. Lungs: Non-labored breathing on room air   Heart: Regular Abdomen: Soft, non tender. Extremities: No edema. No obvious deformity.  Musculoskeletal: No obvious joint swelling.  Neurological: Mental Status: Alert. Speech fluent. No pseudobulbar affect Cranial Nerves: CNII: No RAPD. Visual fields intact. CNIII, IV, VI: PERRL. No nystagmus. EOMI. CN V: Facial sensation intact bilaterally to fine touch. [place] clench strong. Jaw jerk***. CN VII: Facial muscles symmetric and strong. No ptosis at rest or after sustained upgaze***. CN VIII: Hears finger rub well bilaterally. CN IX: No hypophonia. CN X: Palate elevates symmetrically. CN XI: Full strength shoulder shrug bilaterally. CN XII: Tongue protrusion full and midline. No atrophy or fasciculations. No significant dysarthria*** Motor: Tone is ***. *** fasciculations in *** extremities. ***  atrophy. No grip or percussive myotonia.  Individual muscle group testing (MRC grade out of 5):  Movement     Neck flexion ***    Neck extension ***     Right Left   Shoulder abduction *** ***   Shoulder adduction *** ***   Shoulder ext rotation *** ***   Shoulder int rotation *** ***   Elbow flexion *** ***   Elbow extension *** ***   Wrist extension *** ***   Wrist flexion *** ***   Finger abduction - FDI *** ***   Finger abduction - ADM *** ***   Finger extension *** ***   Finger distal flexion - 2/3 *** ***   Finger distal flexion - 4/5 *** ***   Thumb flexion - FPL *** ***   Thumb abduction - APB *** ***    Hip flexion *** ***   Hip extension *** ***   Hip adduction *** ***   Hip abduction *** ***   Knee extension *** ***   Knee flexion *** ***   Dorsiflexion *** ***   Plantarflexion *** ***   Inversion *** ***   Eversion *** ***   Great toe extension *** ***   Great toe flexion *** ***     Reflexes:  Right Left  Bicep *** ***  Tricep *** ***  BrRad *** ***  Knee *** ***  Ankle *** ***   Pathological Reflexes: Babinski: *** response  bilaterally*** Hoffman: *** Troemner: *** Pectoral: *** Palmomental: *** Facial: *** Midline tap: *** Sensation: Pinprick: *** Vibration: *** Temperature: *** Proprioception: *** Coordination: Intact finger-to- nose-finger and heel-to-shin bilaterally. Romberg negative.*** Gait: Able to rise from chair with arms crossed unassisted. Normal, narrow-based gait. Able to tandem walk. Able to walk on toes and heels.***   Lab and Test Review: New results: 04/26/22: Zinc wnl Copper wnl Vit B6 wnl IFE: no M protein  HbA1c (08/31/22): 6.5  Previously reviewed results: Normal or unremarkable: CBC, CMP, folate B12 (03/07/22): 822 HbA1c (09/18/21): 6.6   Imaging: Lumbar xray (12/05/16): FINDINGS: The lumbar vertebral bodies are preserved in height. The disc space heights are well maintained. There is mild facet joint hypertrophy at L4-5. There are no pars defects in there is no spondylolisthesis. The pedicles and transverse processes are intact. The observed portions of the sacrum are normal. The visualized portions of the SI joints appear normal for age. There is calcification in the wall of the abdominal aorta.   IMPRESSION: Mild degenerative facet joint change at L4-5. There is no compression fracture, spondylolisthesis, or high-grade disc space narrowing.   Abdominal aortic atherosclerosis.   Cervical spine xray (12/05/16): FINDINGS: Diffuse degenerative change. No acute bony or joint abnormality identified. No evidence of fracture dislocation. Pulmonary apices are clear .   IMPRESSION: Diffuse degenerative change.  No acute abnormality.  ASSESSMENT: This is Luz Brazen, a 77 y.o. female with:  ***  Plan: ***  Return to clinic in ***  Total time spent reviewing records, interview, history/exam, documentation, and coordination of care on day of encounter:  *** min  Jacquelyne Balint, MD

## 2022-10-17 ENCOUNTER — Encounter: Payer: Self-pay | Admitting: Neurology

## 2022-10-17 ENCOUNTER — Ambulatory Visit: Payer: Medicare HMO | Admitting: Neurology

## 2022-10-17 DIAGNOSIS — Z029 Encounter for administrative examinations, unspecified: Secondary | ICD-10-CM

## 2022-10-25 ENCOUNTER — Ambulatory Visit: Payer: Medicare HMO | Admitting: Neurology

## 2022-10-30 ENCOUNTER — Encounter: Payer: Self-pay | Admitting: Family Medicine

## 2022-10-30 ENCOUNTER — Ambulatory Visit (INDEPENDENT_AMBULATORY_CARE_PROVIDER_SITE_OTHER): Payer: Medicare HMO | Admitting: Family Medicine

## 2022-10-30 VITALS — BP 102/60 | HR 98 | Temp 97.6°F | Ht 65.0 in | Wt 174.0 lb

## 2022-10-30 DIAGNOSIS — R1013 Epigastric pain: Secondary | ICD-10-CM

## 2022-10-30 DIAGNOSIS — R42 Dizziness and giddiness: Secondary | ICD-10-CM | POA: Diagnosis not present

## 2022-10-30 DIAGNOSIS — R2681 Unsteadiness on feet: Secondary | ICD-10-CM | POA: Diagnosis not present

## 2022-10-30 LAB — CBC WITH DIFFERENTIAL/PLATELET
Absolute Monocytes: 644 cells/uL (ref 200–950)
Eosinophils Absolute: 180 cells/uL (ref 15–500)
Eosinophils Relative: 5 %
HCT: 38.7 % (ref 35.0–45.0)
Lymphs Abs: 1271 cells/uL (ref 850–3900)
MCH: 28.7 pg (ref 27.0–33.0)
MCHC: 32.8 g/dL (ref 32.0–36.0)
MCV: 87.6 fL (ref 80.0–100.0)
Monocytes Relative: 17.9 %
Neutro Abs: 1483 cells/uL — ABNORMAL LOW (ref 1500–7800)
RDW: 13.5 % (ref 11.0–15.0)
Total Lymphocyte: 35.3 %

## 2022-10-30 MED ORDER — SUCRALFATE 1 G PO TABS: 1.0000 g | ORAL_TABLET | Freq: Three times a day (TID) | ORAL | 0 refills | Status: DC

## 2022-10-30 NOTE — Progress Notes (Signed)
Pt states she just finished working out as to reason for high heart rate No new SDOH needs since 07/31/22

## 2022-10-30 NOTE — Progress Notes (Signed)
Subjective:    Patient ID: Nina Olson, female    DOB: July 07, 1945, 77 y.o.   MRN: [id number]  HPI Patient is a very pleasant 77 year old African-American female here today for several issues.  First she states for the last several months she has been feeling extremely dizzy.  She feels lightheaded and unsteady on her feet.  She denies any vertigo.  She denies any headaches.  Turning her head or looking up does not make the problem worse.  She denies any syncope or near syncope.  Instead she reports a constant feeling of disequilibrium.  She occasionally stumbles and feels like she is going to lose her balance and fall.  Today on examination cranial nerves II through XII are grossly intact with muscle strength 5/5 equal and symmetric in the upper and lower extremities.  Cerebellar testing is normal except for Romberg testing.  When I performed a Romberg test, the patient swayed significantly and almost fell.  I watched the patient ambulate.  She has an antalgic gait due to pain in her right knee.  However there is no evidence of any foot drop.  She also complains of epigastric pain and early satiety.  She reports a burning feeling in her stomach that makes her not want to eat.  She is already on Pepcid and Nexium.  She is taking Ozempic.  She denies any weight loss or melena or hematochezia Wt Readings from Last 3 Encounters:  10/30/22 174 lb (78.9 kg)  08/31/22 172 lb 6.4 oz (78.2 kg)  06/15/22 177 lb (80.3 kg)    Past Medical History:  Diagnosis Date   Anemia    Arthritis    Colon polyps    GERD (gastroesophageal reflux disease)    High cholesterol    Hypertension    Osteopenia    Palpitations    Past Surgical History:  Procedure Laterality Date   CESAREAN SECTION     Current Outpatient Medications on File Prior to Visit  Medication Sig Dispense Refill   Accu-Chek Softclix Lancets lancets USE TO CHECK CBG ONCE A DAY 100 each 0   atorvastatin (LIPITOR) 80 MG tablet Take 1 tablet  (80 mg total) by mouth daily. 90 tablet 3   blood glucose meter kit and supplies Dispense based on patient and insurance preference. Check CBG once a day (FOR ICD-10 E10.9, E11.9). 1 each 0   esomeprazole (NEXIUM) 40 MG capsule Take 1 capsule by mouth once daily 90 capsule 0   famotidine (PEPCID) 40 MG tablet Take 1 tablet by mouth once daily 90 tablet 0   gabapentin (NEURONTIN) 300 MG capsule Take 1 capsule (300 mg total) by mouth at bedtime as needed (nerve pain in feet). 30 capsule 3   glucose blood (ACCU-CHEK GUIDE) test strip USE TO CHECK CBG ONCE A DAY 100 each 6   hydrochlorothiazide (HYDRODIURIL) 25 MG tablet Take 0.5 tablets (12.5 mg total) by mouth daily. Pt reports she takes half of 25 mg tablet 90 tablet 3   losartan (COZAAR) 100 MG tablet Take 1 tablet by mouth once daily 90 tablet 0   Multiple Vitamin (MULTI-VITAMIN) tablet Take by mouth.     Omega-3 Fatty Acids (FISH OIL PO) Take by mouth as directed.     Probiotic Product (PROBIOTIC DAILY PO) Take by mouth.     Semaglutide, 1 MG/DOSE, 4 MG/3ML SOPN Inject 1 mg as directed once a week. 3 mL 2   traZODone (DESYREL) 50 MG tablet Take 0.5-1 tablets (25-50 mg  total) by mouth at bedtime as needed for sleep. 90 tablet 1   valACYclovir (VALTREX) 1000 MG tablet Take 1 tablet (1,000 mg total) by mouth 2 (two) times daily. 14 tablet 5   No current facility-administered medications on file prior to visit.   No Known Allergies Social History   Socioeconomic History   Marital status: Married    Spouse name: Not on file   Number of children: 3   Years of education: Not on file   Highest education level: Not on file  Occupational History   Not on file  Tobacco Use   Smoking status: Former   Smokeless tobacco: Former    Quit date: 04/09/1978  Vaping Use   Vaping status: Never Used  Substance and Sexual Activity   Alcohol use: No   Drug use: No   Sexual activity: Never  Other Topics Concern   Not on file  Social History Narrative    Are you right handed or left handed? Right   Are you currently employed ? no   What is your current occupation?retired   Do you live at home alone? No    Who lives with you? husband   What type of home do you live in: 1 story or 2 story? one    Caffeine 1 soda a day    Social Determinants of Health   Financial Resource Strain: Low Risk  (04/17/2022)   Overall Financial Resource Strain (CARDIA)    Difficulty of Paying Living Expenses: Not hard at all  Food Insecurity: No Food Insecurity (07/31/2022)   Hunger Vital Sign    Worried About Running Out of Food in the Last Year: Never true    Ran Out of Food in the Last Year: Never true  Transportation Needs: No Transportation Needs (07/31/2022)   PRAPARE - Administrator, Civil Service (Medical): No    Lack of Transportation (Non-Medical): No  Physical Activity: Insufficiently Active (04/17/2022)   Exercise Vital Sign    Days of Exercise per Week: 3 days    Minutes of Exercise per Session: 30 min  Stress: No Stress Concern Present (04/17/2022)   [employer] of Occupational Health - Occupational Stress Questionnaire    Feeling of Stress : Not at all  Social Connections: Socially Integrated (04/17/2022)   Social Connection and Isolation Panel [NHANES]    Frequency of Communication with Friends and Family: More than three times a week    Frequency of Social Gatherings with Friends and Family: Three times a week    Attends Religious Services: More than 4 times per year    Active Member of Clubs or Organizations: Yes    Attends Banker Meetings: More than 4 times per year    Marital Status: Married  Catering manager Violence: Not At Risk (07/31/2022)   Humiliation, Afraid, Rape, and Kick questionnaire    Fear of Current or Ex-Partner: No    Emotionally Abused: No    Physically Abused: No    Sexually Abused: No     Review of Systems  All other systems reviewed and are negative.      Objective:   Physical  Exam Constitutional:      Appearance: Normal appearance. She is normal weight.  HENT:     Right Ear: Tympanic membrane, ear canal and external ear normal.     Left Ear: Tympanic membrane, ear canal and external ear normal.     Mouth/Throat:     Mouth: Mucous membranes  are moist.     Pharynx: No oropharyngeal exudate or posterior oropharyngeal erythema.  Eyes:     General: No scleral icterus.    Extraocular Movements: Extraocular movements intact.     Conjunctiva/sclera: Conjunctivae normal.     Pupils: Pupils are equal, round, and reactive to light.  Neck:     Vascular: No carotid bruit.  Cardiovascular:     Rate and Rhythm: Normal rate and regular rhythm.     Pulses: Normal pulses.     Heart sounds: Normal heart sounds. No murmur heard.    No friction rub. No gallop.  Pulmonary:     Effort: Pulmonary effort is normal. No respiratory distress.     Breath sounds: Normal breath sounds. No stridor. No wheezing, rhonchi or rales.  Abdominal:     General: Bowel sounds are normal. There is no distension.     Palpations: Abdomen is soft. There is no mass.     Tenderness: There is no abdominal tenderness. There is no guarding or rebound.  Musculoskeletal:     Cervical back: Normal range of motion and neck supple.     Right lower leg: No edema.     Left lower leg: No edema.  Lymphadenopathy:     Cervical: No cervical adenopathy.  Skin:    Findings: No erythema or rash.  Neurological:     General: No focal deficit present.     Mental Status: She is alert and oriented to person, place, and time.     Cranial Nerves: No cranial nerve deficit.     Sensory: No sensory deficit.     Motor: No weakness.     Coordination: Romberg sign positive. Coordination normal.     Gait: Gait abnormal.     Deep Tendon Reflexes: Reflexes normal.  Psychiatric:        Mood and Affect: Mood normal.        Behavior: Behavior normal.        Thought Content: Thought content normal.        Judgment:  Judgment normal.           Assessment & Plan:  Epigastric pain - Plan: CBC with Differential/Platelet, COMPLETE METABOLIC PANEL WITH GFR, Lipase  Dysequilibrium  Unsteady gait There are no gross neurologic deficits on exam today.  However I cannot rule out small lacunar infarct.  Symptoms have been going on now for 6 months.  Her blood pressure is very low.  Therefore recommended stopping hydrochlorothiazide to increase cerebral perfusion.  Also recommended temporarily discontinuing gabapentin as this could cause dizziness and disequilibrium.  I will make these 2 changes and recheck the patient next week.  If not improving I would recommend an MRI of the brain to evaluate for any evidence of a small stroke.  Symptoms are not consistent with vertigo.  The patient is having epigastric pain and I suspect gastritis or even an ulcer.  Add sucralfate 1 g p.o. q. ACH S and reassess in 1 week.  Check CBC CMP and lipase.  If labs are normal, and symptoms do not improve, I would recommend an EGD

## 2022-10-31 LAB — COMPLETE METABOLIC PANEL WITH GFR
AG Ratio: 1.7 (calc) (ref 1.0–2.5)
ALT: 15 U/L (ref 6–29)
AST: 15 U/L (ref 10–35)
Albumin: 4.3 g/dL (ref 3.6–5.1)
Alkaline phosphatase (APISO): 112 U/L (ref 37–153)
BUN: 10 mg/dL (ref 7–25)
CO2: 29 mmol/L (ref 20–32)
Calcium: 9.6 mg/dL (ref 8.6–10.4)
Chloride: 107 mmol/L (ref 98–110)
Creat: 0.95 mg/dL (ref 0.60–1.00)
Globulin: 2.5 g/dL (calc) (ref 1.9–3.7)
Glucose, Bld: 94 mg/dL (ref 65–99)
Potassium: 3.9 mmol/L (ref 3.5–5.3)
Sodium: 144 mmol/L (ref 135–146)
Total Bilirubin: 0.5 mg/dL (ref 0.2–1.2)
Total Protein: 6.8 g/dL (ref 6.1–8.1)
eGFR: 62 mL/min/{1.73_m2} (ref >=60)

## 2022-10-31 LAB — CBC WITH DIFFERENTIAL/PLATELET
Basophils Absolute: 22 cells/uL (ref 0–200)
Basophils Relative: 0.6 %
Hemoglobin: 12.7 g/dL (ref 11.7–15.5)
MPV: 9.3 fL (ref 7.5–12.5)
Neutrophils Relative %: 41.2 %
Platelets: 275 10*3/uL (ref 140–400)
RBC: 4.42 10*6/uL (ref 3.80–5.10)
WBC: 3.6 10*3/uL — ABNORMAL LOW (ref 3.8–10.8)

## 2022-10-31 LAB — LIPASE: Lipase: 99 U/L — ABNORMAL HIGH (ref 7–60)

## 2022-11-01 ENCOUNTER — Other Ambulatory Visit: Payer: Self-pay | Admitting: Family Medicine

## 2022-11-01 DIAGNOSIS — K859 Acute pancreatitis without necrosis or infection, unspecified: Secondary | ICD-10-CM

## 2022-11-05 ENCOUNTER — Telehealth: Payer: Self-pay | Admitting: Family Medicine

## 2022-11-05 NOTE — Telephone Encounter (Signed)
As requested by provider, patient called to follow up on recent appointment and to report she's still experiencing dizziness and other sx she reported last week.   Patient aware the prior auth for the MRI has been approved and is ready for scheduling.  In addition to scheduling MRI, patient requesting call back with next best steps.  Please advise at [phone]

## 2022-11-06 ENCOUNTER — Other Ambulatory Visit: Payer: Self-pay | Admitting: Family Medicine

## 2022-11-06 DIAGNOSIS — R270 Ataxia, unspecified: Secondary | ICD-10-CM

## 2022-11-06 DIAGNOSIS — R42 Dizziness and giddiness: Secondary | ICD-10-CM

## 2022-11-08 ENCOUNTER — Telehealth: Payer: Self-pay

## 2022-11-08 ENCOUNTER — Ambulatory Visit (INDEPENDENT_AMBULATORY_CARE_PROVIDER_SITE_OTHER): Payer: Medicare HMO | Admitting: Family Medicine

## 2022-11-08 VITALS — BP 126/82 | HR 99 | Ht 65.0 in | Wt 167.0 lb

## 2022-11-08 DIAGNOSIS — K921 Melena: Secondary | ICD-10-CM

## 2022-11-08 DIAGNOSIS — R319 Hematuria, unspecified: Secondary | ICD-10-CM | POA: Diagnosis not present

## 2022-11-08 LAB — URINALYSIS, ROUTINE W REFLEX MICROSCOPIC
Glucose, UA: NEGATIVE
Hgb urine dipstick: NEGATIVE
Nitrite: NEGATIVE
Specific Gravity, Urine: 1.02 (ref 1.001–1.035)
pH: 6.5 (ref 5.0–8.0)

## 2022-11-08 LAB — MICROSCOPIC MESSAGE

## 2022-11-08 MED ORDER — AMOXICILLIN-POT CLAVULANATE 875-125 MG PO TABS: 1.0000 | ORAL_TABLET | Freq: Two times a day (BID) | ORAL | 0 refills | Status: DC

## 2022-11-08 NOTE — Telephone Encounter (Signed)
Pt was seen by pcp today and forgot to ask about her numbers being high concerning her pancreas? Pt would like a cb from nurse/pcp to discuss what she could do about this please.Please advise.  Cb#: [phone]

## 2022-11-08 NOTE — Progress Notes (Signed)
Subjective:    Patient ID: Nina Olson, female    DOB: [birth year], 77 y.o.   MRN: [id number]  Clarification, the patient does not have hematuria.  She originally made the appointment for this but that was miscommunication.  Regardless the urine sample today shows no blood.  Instead, the patient states that she had 3-4 episodes of watery diarrhea on Monday.  There was blood when she wiped after having the diarrhea on the tissue paper.  She denies any blood in her urine.  Instead she had some blood in the stool and blood on the tissue paper when she wiped.  She also reports pain in her right lower quadrant.  However the diarrhea resolved on Tuesday and has not recurred since.  The pain in the right lower quadrant has improved and is almost gone.  She denies any fevers or chills.  She denies seeing any more blood when she defecates.  I performed a rectal exam today with a chaperone present.  There is no visible source of bleeding external.  Specifically there is no tear or hemorrhoid.   Past Medical History:  Diagnosis Date   Anemia    Arthritis    Colon polyps    GERD (gastroesophageal reflux disease)    High cholesterol    Hypertension    Osteopenia    Palpitations    Past Surgical History:  Procedure Laterality Date   CESAREAN SECTION     Current Outpatient Medications on File Prior to Visit  Medication Sig Dispense Refill   Accu-Chek Softclix Lancets lancets USE TO CHECK CBG ONCE A DAY 100 each 0   atorvastatin (LIPITOR) 80 MG tablet Take 1 tablet (80 mg total) by mouth daily. 90 tablet 3   blood glucose meter kit and supplies Dispense based on patient and insurance preference. Check CBG once a day (FOR ICD-10 E10.9, E11.9). 1 each 0   esomeprazole (NEXIUM) 40 MG capsule Take 1 capsule by mouth once daily 90 capsule 0   famotidine (PEPCID) 40 MG tablet Take 1 tablet by mouth once daily 90 tablet 0   gabapentin (NEURONTIN) 300 MG capsule Take 1 capsule (300 mg total) by mouth at  bedtime as needed (nerve pain in feet). 30 capsule 3   glucose blood (ACCU-CHEK GUIDE) test strip USE TO CHECK CBG ONCE A DAY 100 each 6   hydrochlorothiazide (HYDRODIURIL) 25 MG tablet Take 0.5 tablets (12.5 mg total) by mouth daily. Pt reports she takes half of 25 mg tablet 90 tablet 3   losartan (COZAAR) 100 MG tablet Take 1 tablet by mouth once daily 90 tablet 0   Multiple Vitamin (MULTI-VITAMIN) tablet Take by mouth.     Omega-3 Fatty Acids (FISH OIL PO) Take by mouth as directed.     Probiotic Product (PROBIOTIC DAILY PO) Take by mouth.     Semaglutide, 1 MG/DOSE, 4 MG/3ML SOPN Inject 1 mg as directed once a week. 3 mL 2   sucralfate (CARAFATE) 1 g tablet Take 1 tablet (1 g total) by mouth 4 (four) times daily -  with meals and at bedtime. 120 tablet 0   traZODone (DESYREL) 50 MG tablet Take 0.5-1 tablets (25-50 mg total) by mouth at bedtime as needed for sleep. 90 tablet 1   valACYclovir (VALTREX) 1000 MG tablet Take 1 tablet (1,000 mg total) by mouth 2 (two) times daily. 14 tablet 5   No current facility-administered medications on file prior to visit.   No Known Allergies Social History  Socioeconomic History   Marital status: Married    Spouse name: Not on file   Number of children: 3   Years of education: Not on file   Highest education level: Not on file  Occupational History   Not on file  Tobacco Use   Smoking status: Former   Smokeless tobacco: Former    Quit date: 04/09/1978  Vaping Use   Vaping status: Never Used  Substance and Sexual Activity   Alcohol use: No   Drug use: No   Sexual activity: Never  Other Topics Concern   Not on file  Social History Narrative   Are you right handed or left handed? Right   Are you currently employed ? no   What is your current occupation?retired   Do you live at home alone? No    Who lives with you? husband   What type of home do you live in: 1 story or 2 story? one    Caffeine 1 soda a day    Social Determinants of  Health   Financial Resource Strain: Low Risk  (04/17/2022)   Overall Financial Resource Strain (CARDIA)    Difficulty of Paying Living Expenses: Not hard at all  Food Insecurity: No Food Insecurity (07/31/2022)   Hunger Vital Sign    Worried About Running Out of Food in the Last Year: Never true    Ran Out of Food in the Last Year: Never true  Transportation Needs: No Transportation Needs (07/31/2022)   PRAPARE - Administrator, Civil Service (Medical): No    Lack of Transportation (Non-Medical): No  Physical Activity: Insufficiently Active (04/17/2022)   Exercise Vital Sign    Days of Exercise per Week: 3 days    Minutes of Exercise per Session: 30 min  Stress: No Stress Concern Present (04/17/2022)   [employer] of Occupational Health - Occupational Stress Questionnaire    Feeling of Stress : Not at all  Social Connections: Socially Integrated (04/17/2022)   Social Connection and Isolation Panel [NHANES]    Frequency of Communication with Friends and Family: More than three times a week    Frequency of Social Gatherings with Friends and Family: Three times a week    Attends Religious Services: More than 4 times per year    Active Member of Clubs or Organizations: Yes    Attends Banker Meetings: More than 4 times per year    Marital Status: Married  Catering manager Violence: Not At Risk (07/31/2022)   Humiliation, Afraid, Rape, and Kick questionnaire    Fear of Current or Ex-Partner: No    Emotionally Abused: No    Physically Abused: No    Sexually Abused: No     Review of Systems  Genitourinary:  Positive for hematuria.  All other systems reviewed and are negative.      Objective:   Physical Exam Exam conducted with a chaperone present.  Constitutional:      Appearance: Normal appearance. She is normal weight.  HENT:     Right Ear: External ear normal.     Left Ear: External ear normal.  Neck:     Vascular: No carotid bruit.  Cardiovascular:      Rate and Rhythm: Normal rate and regular rhythm.     Pulses: Normal pulses.     Heart sounds: Normal heart sounds. No murmur heard.    No friction rub. No gallop.  Pulmonary:     Effort: Pulmonary effort is normal. No  respiratory distress.     Breath sounds: Normal breath sounds. No stridor. No wheezing, rhonchi or rales.  Abdominal:     General: Bowel sounds are normal. There is no distension.     Palpations: Abdomen is soft. There is no mass.     Tenderness: There is no abdominal tenderness. There is no guarding or rebound.  Genitourinary:    Rectum: Normal. Guaiac result negative. No mass, tenderness, anal fissure or external hemorrhoid.  Musculoskeletal:     Cervical back: Normal range of motion and neck supple.  Lymphadenopathy:     Cervical: No cervical adenopathy.  Neurological:     Mental Status: She is alert.           Assessment & Plan:  Hematuria, unspecified type - Plan: Urinalysis, Routine w reflex microscopic  Blood in stool Given the diarrhea, the blood mixed with the stool and on the tissue paper, and the pain in the right lower quadrant, I suspect that the patient had colitis possibly diverticulitis.  However her symptoms have improved and have completely stopped.  Therefore I do not feel any other treatment is necessary.  If the patient starts having abdominal pain and diarrhea again I want her to take Augmentin for possible diverticulitis.  If she continues to see blood in her stool I would recommend GI consult for colonoscopy.  However this seems to have been a self-limited problem.  She will notify me if it returns.

## 2022-11-14 ENCOUNTER — Encounter: Payer: Self-pay | Admitting: *Deleted

## 2022-11-14 NOTE — Progress Notes (Signed)
Pt attended 2nd screening event this year (first on 07/31/22) on 10/30/22, where her b/p was 100/65. At the event, the pt confirmed her PCP is Dr. Lynnea Ferrier from [facility] and did not indicate she had any SDOH insecurities. Chart review indicates pt was last seen by Dr. Tanya Nones on 11/08/22, had a previous visit on 08/31/22, and is scheduled for her AWV with his office on 04/23/23. No additional health equity team support indicated at this time.

## 2022-11-15 ENCOUNTER — Ambulatory Visit
Admission: RE | Admit: 2022-11-15 | Discharge: 2022-11-15 | Disposition: A | Discharge status: Home / Self Care | Payer: Medicare HMO | Source: Ambulatory Visit | Attending: Family Medicine | Admitting: Family Medicine

## 2022-11-15 DIAGNOSIS — K859 Acute pancreatitis without necrosis or infection, unspecified: Secondary | ICD-10-CM

## 2022-11-15 MED ORDER — IOPAMIDOL (ISOVUE-300) INJECTION 61%
80.0000 mL | Freq: Once | INTRAVENOUS | Status: AC | PRN
  Administered 2022-11-15: 80 mL via INTRAVENOUS

## 2022-11-17 ENCOUNTER — Other Ambulatory Visit: Payer: Self-pay | Admitting: Family Medicine

## 2022-11-17 DIAGNOSIS — I10 Essential (primary) hypertension: Secondary | ICD-10-CM

## 2022-11-19 NOTE — Telephone Encounter (Signed)
Requested Prescriptions  Pending Prescriptions Disp Refills   losartan (COZAAR) 100 MG tablet [Pharmacy Med Name: Losartan Potassium 100 MG Oral Tablet] 90 tablet 0    Sig: Take 1 tablet by mouth once daily     Cardiovascular:  Angiotensin Receptor Blockers Failed - 11/17/2022  6:55 AM      Failed - Valid encounter within last 6 months    Recent Outpatient Visits           1 year ago Gastroesophageal reflux disease without esophagitis   [facility] Tanya Nones, Priscille Heidelberg, MD   1 year ago Insomnia secondary to depression with anxiety   [facility] Valentino Nose, NP   1 year ago Encounter for annual wellness visit (AWV) in Medicare patient   [facility] Valentino Nose, NP   1 year ago HSV (herpes simplex virus) anogenital infection   [facility] Cathlean Marseilles A, NP   2 years ago Controlled type 2 diabetes mellitus with neuropathy (HCC)   [facility] Valentino Nose, NP              Passed - Cr in normal range and within 180 days    Creat  Date Value Ref Range Status  10/30/2022 0.95 0.60 - 1.00 mg/dL Final   Creatinine, Urine  Date Value Ref Range Status  08/31/2022 CANCELED      Comment:    TEST NOT PERFORMED . Specimen leaked in transit.  Result canceled by the ancillary.          Passed - K in normal range and within 180 days    Potassium  Date Value Ref Range Status  10/30/2022 3.9 3.5 - 5.3 mmol/L Final         Passed - Patient is not pregnant      Passed - Last BP in normal range    BP Readings from Last 1 Encounters:  11/08/22 126/82

## 2022-11-25 ENCOUNTER — Other Ambulatory Visit: Payer: Self-pay | Admitting: Family Medicine

## 2022-11-27 NOTE — Telephone Encounter (Signed)
OV 11/08/22 Requested Prescriptions  Pending Prescriptions Disp Refills   sucralfate (CARAFATE) 1 g tablet [Pharmacy Med Name: Sucralfate 1 GM Oral Tablet] 120 tablet 0    Sig: TAKE 1 TABLET BY MOUTH 4 TIMES DAILY WITH  MEALS  AND  AT  BEDTIME.     Gastroenterology: Antiacids Failed - 11/25/2022  6:36 PM      Failed - Valid encounter within last 12 months    Recent Outpatient Visits           1 year ago Gastroesophageal reflux disease without esophagitis   [facility] Tanya Nones, Priscille Heidelberg, MD   1 year ago Insomnia secondary to depression with anxiety   [facility] Valentino Nose, NP   1 year ago Encounter for annual wellness visit (AWV) in Medicare patient   [facility] Valentino Nose, NP   1 year ago HSV (herpes simplex virus) anogenital infection   [facility] Valentino Nose, NP   2 years ago Controlled type 2 diabetes mellitus with neuropathy [facility])   [facility] Valentino Nose, NP

## 2022-11-30 ENCOUNTER — Other Ambulatory Visit: Payer: Medicare HMO

## 2022-12-03 ENCOUNTER — Telehealth: Payer: Self-pay | Admitting: Family Medicine

## 2022-12-03 NOTE — Telephone Encounter (Signed)
Patient requesting call back to discuss CT results.   Please advise at [phone].

## 2022-12-13 ENCOUNTER — Other Ambulatory Visit: Payer: Self-pay | Admitting: Family Medicine

## 2022-12-13 ENCOUNTER — Ambulatory Visit
Admission: RE | Admit: 2022-12-13 | Discharge: 2022-12-13 | Disposition: A | Discharge status: Home / Self Care | Payer: Medicare HMO | Source: Ambulatory Visit | Attending: Family Medicine | Admitting: Family Medicine

## 2022-12-13 DIAGNOSIS — R9082 White matter disease, unspecified: Secondary | ICD-10-CM | POA: Diagnosis not present

## 2022-12-13 DIAGNOSIS — R270 Ataxia, unspecified: Secondary | ICD-10-CM | POA: Diagnosis not present

## 2022-12-13 DIAGNOSIS — E1140 Type 2 diabetes mellitus with diabetic neuropathy, unspecified: Secondary | ICD-10-CM | POA: Diagnosis not present

## 2022-12-13 DIAGNOSIS — Z8673 Personal history of transient ischemic attack (TIA), and cerebral infarction without residual deficits: Secondary | ICD-10-CM | POA: Diagnosis not present

## 2022-12-13 DIAGNOSIS — I10 Essential (primary) hypertension: Secondary | ICD-10-CM | POA: Diagnosis not present

## 2022-12-13 DIAGNOSIS — R42 Dizziness and giddiness: Secondary | ICD-10-CM | POA: Diagnosis not present

## 2022-12-13 DIAGNOSIS — K219 Gastro-esophageal reflux disease without esophagitis: Secondary | ICD-10-CM

## 2022-12-13 DIAGNOSIS — I63512 Cerebral infarction due to unspecified occlusion or stenosis of left middle cerebral artery: Secondary | ICD-10-CM | POA: Diagnosis not present

## 2022-12-13 MED ORDER — GADOPICLENOL 0.5 MMOL/ML IV SOLN
7.5000 mL | Freq: Once | INTRAVENOUS | Status: AC | PRN
  Administered 2022-12-13: 7.5 mL via INTRAVENOUS

## 2022-12-13 NOTE — Telephone Encounter (Signed)
Requested Prescriptions  Refused Prescriptions Disp Refills   esomeprazole (NEXIUM) 40 MG capsule [Pharmacy Med Name: Esomeprazole Magnesium Oral Capsule Delayed Release 40 MG] 90 capsule 3    Sig: TAKE 1 CAPSULE EVERY DAY     Gastroenterology: Proton Pump Inhibitors 2 Failed - 12/13/2022  2:16 AM      Failed - Valid encounter within last 12 months    Recent Outpatient Visits           1 year ago Gastroesophageal reflux disease without esophagitis   [facility] Pickard, Priscille Heidelberg, MD   1 year ago Insomnia secondary to depression with anxiety   [facility] Valentino Nose, NP   1 year ago Encounter for annual wellness visit (AWV) in Medicare patient   [facility] Valentino Nose, NP   1 year ago HSV (herpes simplex virus) anogenital infection   [facility] Cathlean Marseilles A, NP   2 years ago Controlled type 2 diabetes mellitus with neuropathy [facility])   [facility] Valentino Nose, NP              Passed - ALT in normal range and within 360 days    ALT  Date Value Ref Range Status  10/30/2022 15 6 - 29 U/L Final         Passed - AST in normal range and within 360 days    AST  Date Value Ref Range Status  10/30/2022 15 10 - 35 U/L Final

## 2022-12-31 ENCOUNTER — Encounter: Payer: Self-pay | Admitting: Family Medicine

## 2023-01-01 ENCOUNTER — Other Ambulatory Visit: Payer: Self-pay

## 2023-01-01 DIAGNOSIS — R42 Dizziness and giddiness: Secondary | ICD-10-CM

## 2023-01-15 ENCOUNTER — Telehealth: Payer: Self-pay

## 2023-01-15 NOTE — Telephone Encounter (Signed)
Pt called in wanting to speak with nurse about recent MRI results. Pt stated she is still waiting to hear from office about scheduling for a doppler of carotid. Pt states that it has been some time since this was supposed to be scheduled and she is still experiencing dizzy spells. Pt would like to speak to nurse to know if she should be seen by pcp. Please advise.  Cb#: [phone]

## 2023-01-21 ENCOUNTER — Ambulatory Visit
Admission: RE | Admit: 2023-01-21 | Discharge: 2023-01-21 | Disposition: A | Discharge status: Home / Self Care | Payer: Medicare HMO | Source: Ambulatory Visit | Attending: Family Medicine | Admitting: Family Medicine

## 2023-01-21 DIAGNOSIS — R42 Dizziness and giddiness: Secondary | ICD-10-CM | POA: Diagnosis not present

## 2023-01-22 ENCOUNTER — Encounter: Payer: Self-pay | Admitting: Family Medicine

## 2023-02-04 ENCOUNTER — Telehealth: Payer: Self-pay

## 2023-02-04 ENCOUNTER — Other Ambulatory Visit: Payer: Self-pay

## 2023-02-04 DIAGNOSIS — R42 Dizziness and giddiness: Secondary | ICD-10-CM

## 2023-02-04 DIAGNOSIS — R270 Ataxia, unspecified: Secondary | ICD-10-CM

## 2023-02-04 NOTE — Telephone Encounter (Signed)
Pt called in to inquire about her results from some recent test she had done. Pt also wanted to speak with nurse as to why pt needed a CT scan done. Please advise  Cb#: [phone]

## 2023-02-13 ENCOUNTER — Other Ambulatory Visit: Payer: Self-pay | Admitting: Family Medicine

## 2023-02-13 DIAGNOSIS — I10 Essential (primary) hypertension: Secondary | ICD-10-CM

## 2023-02-18 ENCOUNTER — Ambulatory Visit (HOSPITAL_COMMUNITY): Payer: Medicare HMO

## 2023-02-20 ENCOUNTER — Other Ambulatory Visit: Payer: Self-pay

## 2023-02-20 ENCOUNTER — Ambulatory Visit (HOSPITAL_COMMUNITY): Payer: Medicare HMO | Attending: Family Medicine

## 2023-02-20 DIAGNOSIS — R270 Ataxia, unspecified: Secondary | ICD-10-CM | POA: Diagnosis not present

## 2023-02-20 DIAGNOSIS — R42 Dizziness and giddiness: Secondary | ICD-10-CM | POA: Diagnosis not present

## 2023-02-20 DIAGNOSIS — R262 Difficulty in walking, not elsewhere classified: Secondary | ICD-10-CM | POA: Diagnosis not present

## 2023-02-20 NOTE — Therapy (Signed)
OUTPATIENT PHYSICAL THERAPY VESTIBULAR EVALUATION     Patient Name: Nina Olson MRN: [id number] DOB:06/13/1945, 77 y.o., female Today's Date: 02/20/2023  END OF SESSION:  PT End of Session - 02/20/23 1328     Visit Number 1    Number of Visits 8    Date for PT Re-Evaluation 03/20/23    Authorization Type [employer] (requested 8 visits)    Progress Note Due on Visit 8    PT Start Time 1015    PT Stop Time 1115    PT Time Calculation (min) 60 min    Equipment Utilized During Treatment Gait belt    Activity Tolerance Patient tolerated treatment well    Behavior During Therapy WFL for tasks assessed/performed             Past Medical History:  Diagnosis Date   Anemia    Arthritis    Colon polyps    CVA (cerebral vascular accident) (HCC)    CVA (cerebrovascular accident) (HCC)    L MCA   GERD (gastroesophageal reflux disease)    High cholesterol    Hypertension    Osteopenia    Palpitations    Past Surgical History:  Procedure Laterality Date   CESAREAN SECTION     Patient Active Problem List   Diagnosis Date Noted   Abdominal bloating 07/15/2020   Abnormal feces 07/15/2020   Colon cancer screening 07/15/2020   Family history of malignant neoplasm of gastrointestinal tract 07/15/2020   Generalized abdominal pain 07/15/2020   History of colonic polyps 07/15/2020   Rectal bleeding 07/15/2020   HSV (herpes simplex virus) anogenital infection 07/15/2020   Rectal itching 07/15/2020   Bladder prolapse, female, acquired 04/28/2019   Controlled type 2 diabetes mellitus with neuropathy (HCC) 10/27/2018   OA (osteoarthritis) of hip 05/24/2017   DDD (degenerative disc disease), lumbar 05/24/2017   GERD (gastroesophageal reflux disease) 02/10/2016   Essential hypertension 07/26/2015   Hyperlipidemia 07/26/2015   Insomnia secondary to depression with anxiety 07/26/2015   Osteopenia    Palpitations    Colon polyps     PCP: Donita Brooks, MD REFERRING  PROVIDER: Donita Brooks, MD  REFERRING DIAG: R42 (ICD-10-CM) - Dizziness R27.0 (ICD-10-CM) - Ataxia  THERAPY DIAG:  Dizziness and giddiness  Difficulty in walking, not elsewhere classified  ONSET DATE: Patient uncertain, most probably within a year  Rationale for Evaluation and Treatment: Rehabilitation  SUBJECTIVE:   SUBJECTIVE STATEMENT: EVAL: Arrives to the clinic with lightheadedness and dizziness (wooziness, tipsy feeling). Patient states that when she gets up from the bed in the morning, she gets dizzy. Bending over also makes her dizzy. At times, looking outside the window of a moving car can make her feel dizzy. Patient also reports some unsteadiness when she walks. Denies dizziness when laying on her back or side. Patient is not sure when her symptoms started but states that it could probably almost a year now since the symptoms started. Unsure whether she had the dizziness before she had the stroke or she got dizzy after the stroke. Patient had an MRI on the brain (see below). Condition has been getting worse and reported her issue to her PCP and was then referred to outpatient PT evaluation and management. Pt accompanied by: self  PERTINENT HISTORY: hx of CVA  PAIN:  Are you having pain? No  PRECAUTIONS: Fall  RED FLAGS: Bowel or bladder incontinence: No, Spinal tumors: No, and Cauda equina syndrome: No   WEIGHT BEARING RESTRICTIONS: No  FALLS: Has patient fallen in last 6 months? No  LIVING ENVIRONMENT: Lives with: lives with their spouse Lives in: House/apartment Stairs: Yes: External: 2 steps; none Has following equipment at home: None  PLOF: Independent and Independent with basic ADLs  PATIENT GOALS: "I don't know what my goal is. I don't know what PT is for."  OBJECTIVE:  Note: Objective measures were completed at Evaluation unless otherwise noted.  DIAGNOSTIC FINDINGS:  12/13/22 MRI HEAD WITHOUT AND WITH CONTRAST   TECHNIQUE: Multiplanar,  multiecho pulse sequences of the brain and surrounding structures were obtained without and with intravenous contrast.   CONTRAST:  7.5 mL Vueway   COMPARISON:  None Available.   FINDINGS: Brain: Cerebral volume is within normal limits for age. No restricted diffusion to suggest acute infarction. No midline shift, mass effect, evidence of mass lesion, ventriculomegaly, extra-axial collection or acute intracranial hemorrhage. Cervicomedullary junction and pituitary are within normal limits.   Incidental dural calcifications. Cortical encephalomalacia left middle frontal gyrus and frontal operculum (series 112, image 19 and series 118, image 18), left MCA territory. No chronic cerebral blood products on SWI. Scattered, patchy additional bilateral cerebral white matter T2 and FLAIR hyperintensity, moderate for age. Occasional T2 shine through associated on diffusion. Mild T2 heterogeneity in the bilateral deep gray nuclei and pons. Cerebellum is spared. No abnormal enhancement identified. No dural thickening.   Vascular: Major intracranial vascular flow voids are preserved. Following contrast the major dural venous sinuses are enhancing and appear to be patent.   Skull and upper cervical spine: Negative for age visible cervical spine. Visualized bone marrow signal is within normal limits.   Sinuses/Orbits: Postoperative changes to both globes, otherwise negative orbits. Mild maxillary sinus circumferential mucosal thickening. Other paranasal sinuses and mastoids are well aerated. No sinus fluid levels.   Other: Visible internal auditory structures appear normal. Normal stylomastoid foramina. Negative visible scalp and face.   IMPRESSION: 1. No acute intracranial abnormality. 2. 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 Moderate for age cerebral white matter and mild for age other signal changes in the bilateral brain, compatible with chronic  small vessel disease.    COGNITION: Overall cognitive status: Within functional limits for tasks assessed   SENSATION: Not tested   POSTURE:  No Significant postural limitations  Cervical ROM:    Active A/PROM (deg) eval  Flexion WFL  Extension WFL  Right lateral flexion   Left lateral flexion   Right rotation WFL  Left rotation WFL  (Blank rows = not tested)   LOWER EXTREMITY MMT:   MMT Right eval Left eval  Hip flexion 4+ 4+  Hip abduction 4+ 4+  Hip adduction    Hip internal rotation    Hip external rotation    Knee flexion 4+ 4+  Knee extension 4+ 4+  Ankle dorsiflexion 4+ 4+  Ankle plantarflexion 4+ 4+  Ankle inversion    Ankle eversion    (Blank rows = not tested)  BED MOBILITY:  Sit to supine Complete Independence Supine to sit Complete Independence Rolling to Right Complete Independence Rolling to Left Complete Independence  TRANSFERS: Assistive device utilized: None  Sit to stand: Complete Independence Stand to sit: Complete Independence  GAIT: Gait pattern: decreased step length- Right, decreased step length- Left, and wide BOS Distance walked: 397 ft Assistive device utilized: None Level of assistance: Complete Independence Comments: done during  FUNCTIONAL TESTS:  5 times sit to stand: 12.96 sec 2 minute walk test: 397 ft Dynamic  Gait Index: 14 MCTSIB: Condition 1: Avg of 3 trials: 30 sec, Condition 2: Avg of 3 trials: 30 sec, Condition 3: Avg of 3 trials: 30 sec, Condition 4: Avg of 3 trials: 15 sec, and Total Score: 105/120  DGI (02/20/23) 1. Gait level surface (2) Mild Impairment: Walks 20', uses assistive devices, slower speed, mild gait deviations. 2. Change in gait speed (2) Mild Impairment: Is able to change speed but demonstrates mild gait deviations, or not gait deviations but unable to achieve a significant change in velocity, or uses an assistive device. 3. Gait with horizontal head turns (1) Moderate Impairment:  Performs head turns with moderate change in gait velocity, slows down, staggers but recovers, can continue to walk. 4. Gait with vertical head turns 1) Moderate Impairment: Performs head turns with moderate change in gait velocity, slows down, staggers but recovers, can continue to walk. 5. Gait and pivot turn (2) Mild Impairment: Pivot turns safely in > 3 seconds and stops with no loss of balance. 6. Step over obstacle (2) Mild Impairment: Is able to step over box, but must slow down and adjust steps to clear box safely. 7. Step around obstacles (2) Mild Impairment: Is able to step around both cones, but must slow down and adjust steps to clear cones. 8. Stairs (2) Mild Impairment: Alternating feet, must use rail.  TOTAL SCORE: 14 / 24   PATIENT SURVEYS:  DHI to be determined next session  VESTIBULAR ASSESSMENT:  GENERAL OBSERVATION: ambulatory without assistive device   SYMPTOM BEHAVIOR: Subjective history: Patient is not sure when her symptoms started but states that it could probably almost a year now since the symptoms started. Unsure whether she had the dizziness before she had the stroke or she got dizzy after the stroke. Patient had an MRI on the brain (see above). Condition has been getting worse and reported her issue to her PCP and was then referred to outpatient PT evaluation and management  Non-Vestibular symptoms:  none  Type of dizziness: Imbalance (Disequilibrium), Lightheadedness/Faint, and "tipsy"  Duration: few minutes  Aggravating factors: Induced by motion: bending over, Worse in the morning, and Worse outside or in busy environment  Relieving factors: rest  Progression of symptoms: worse  OCULOMOTOR EXAM:  Ocular Alignment: normal  Ocular ROM: No Limitations  Spontaneous Nystagmus: absent  Gaze-Induced Nystagmus: absent  Smooth Pursuits:  overshoots  Saccades: hypermetric/overshoots   VESTIBULAR - OCULAR REFLEX:   Slow VOR: Positive Bilaterally  VOR  Cancellation: Comment: not tested  Head-Impulse Test: HIT Right: positive, corrective saccades, overshoots HIT Left: positive, corrective saccades, overshoots   POSITIONAL TESTING: Right Dix-Hallpike: no nystagmus and no vertigo but reports of dizziness Left Dix-Hallpike: no nystagmus and no vertigo but reports of dizziness Right Roll Test: no nystagmus and no vertigo but reports of dizziness Left Roll Test: no nystagmus and no vertigo  but reports of dizziness  MOTION SENSITIVITY:  Motion Sensitivity Quotient Intensity: 0 = none, 1 = Lightheaded, 2 = Mild, 3 = Moderate, 4 = Severe, 5 = Vomiting  Intensity  1. Sitting to supine 2  2. Supine to L side   3. Supine to R side   4. Supine to sitting 2  5. L Hallpike-Dix 2  6. Up from L  2  7. R Hallpike-Dix 2  8. Up from R  2  9. Sitting, head tipped to L knee   10. Head up from L knee   11. Sitting, head tipped to R knee 2  12.  Head up from R knee 2  13. Sitting head turns x5   14.Sitting head nods x5   15. In stance, 180 turn to L    16. In stance, 180 turn to R     OTHOSTATICS: Supine: 135/83 with HR 88 Sitting: 131/77 with HR 87 Standing: 126/73 with HR 92   VESTIBULAR TREATMENT:                                                                                                   DATE:  02/20/23 Evaluation and patient education done   PATIENT EDUCATION: Education details: Educated on the pathoanatomy of vestibular hypofunction. Educated on the goals and course of rehab.  Person educated: Patient Education method: Explanation Education comprehension: verbalized understanding  HOME EXERCISE PROGRAM:  GOALS: Goals reviewed with patient? Yes  SHORT TERM GOALS: Target date: 03/06/23  Pt will demonstrate indep in HEP to facilitate carry-over of skilled services and improve functional outcomes Goal status: INITIAL  LONG TERM GOALS: Target date: 03/20/23  Pt will decrease 5TSTS by at least 3 seconds in order to  demonstrate clinically significant improvement in LE strength   Baseline: 12.96 sec Goal status: INITIAL  2.  Pt will improve DGI by at least 6 points in order to demonstrate clinically significant improvement in balance and decreased risk for falls  Baseline: 14 Goal status: INITIAL  3.  Pt will increase by at least 40 ft in order to demonstrate clinically significant improvement in community ambulation Baseline: 397 ft Goal status: INITIAL  4.  Pt will demonstrate an increase score in MCTSIB to 115 to reduce risk of falls and demonstrate improvement in balance Baseline: 105/120 Goal status: INITIAL  5.  Pt will demonstrate a motion sensitivity quotient of 0 in sit <> supine and with head tipping to facilitate ease and safety in ADLs and ambulation. Baseline: 2 Goal status: INITIAL  6.  Pt will demonstrate intact VOR 1 (no dizziness) to facilitate safety in ADLs Baseline: corrective saccades with dizziness on VOR 1 Goal status: INITIAL  ASSESSMENT:  CLINICAL IMPRESSION: Patient is a 77 y.o. female who was seen today for physical therapy evaluation and treatment for dizziness and ataxia. Patient's condition is further defined by difficulty with bending over due to impaired vestibular function and impaired proprioception. Skilled PT is required to address the impairments and functional limitations listed below.    OBJECTIVE IMPAIRMENTS: Abnormal gait, decreased activity tolerance, decreased balance, difficulty walking, dizziness, and impaired flexibility.   ACTIVITY LIMITATIONS: carrying, lifting, bending, sitting, standing, squatting, and stairs  PARTICIPATION LIMITATIONS: meal prep, cleaning, laundry, shopping, community activity, and yard work  PERSONAL FACTORS: Age and Time since onset of injury/illness/exacerbation are also affecting patient's functional outcome.   REHAB POTENTIAL: Good  CLINICAL DECISION MAKING: Stable/uncomplicated  EVALUATION COMPLEXITY:  Low   PLAN:  PT FREQUENCY: 2x/week  PT DURATION: 4 weeks  PLANNED INTERVENTIONS: 97164- PT Re-evaluation, 97110-Therapeutic exercises, 97530- Therapeutic activity, [id number]- Neuromuscular re-education, 97535- Self Care, 16109- Manual therapy, [phone]- Gait training, and Patient/Family education  PLAN FOR NEXT SESSION: Provide HEP. Begin gaze stabilization  and balance exercises.   Tish Frederickson. Lillie Portner, PT, DPT, OCS Board-Certified Clinical Specialist in Orthopedic PT PT Compact Privilege # ([place]): [id number] T  02/20/2023, 1:30 PM   [employer] Auth Request  Referring diagnosis code (ICD 10)? R42, R47 Treatment diagnosis codes (ICD 10)? (if different than referring diagnosis) R42, R26.2 What was this (referring dx) caused by? []  Surgery []  Fall []  Ongoing issue []  Arthritis [x]  Other: _vestibular___________  Laterality: []  Rt []  Lt [x]  Both  Deficits: []  Pain []  Stiffness []  Weakness []  Edema [x]  Balance Deficits []  Coordination [x]  Gait Disturbance []  ROM [x]  Other: vestibular   Functional Tool Score: Dynamic Gait Index: 14/24  CPT codes: See Planned Interventions listed in the Plan section of the Evaluation.

## 2023-02-25 ENCOUNTER — Ambulatory Visit (HOSPITAL_COMMUNITY): Payer: Medicare HMO | Admitting: Physical Therapy

## 2023-02-25 DIAGNOSIS — R262 Difficulty in walking, not elsewhere classified: Secondary | ICD-10-CM

## 2023-02-25 DIAGNOSIS — R270 Ataxia, unspecified: Secondary | ICD-10-CM | POA: Diagnosis not present

## 2023-02-25 DIAGNOSIS — R42 Dizziness and giddiness: Secondary | ICD-10-CM

## 2023-02-25 NOTE — Therapy (Addendum)
OUTPATIENT PHYSICAL THERAPY VESTIBULAR Treatment    Patient Name: Nina Olson MRN: [id number] DOB:February 26, 1946, 77 y.o., female Today's Date: 02/25/2023  END OF SESSION:  PT End of Session - 02/25/23 1226     Visit Number 2    Number of Visits 8    Date for PT Re-Evaluation 03/20/23    Authorization Type [employer]- 8 visits approved 11/11/ thru 12/11    Authorization Time Period 11/11/ thru 12/11    Authorization - Visit Number 1    Authorization - Number of Visits 8    Progress Note Due on Visit 8    PT Start Time 1145    PT Stop Time 1224    PT Time Calculation (min) 39 min    Activity Tolerance Patient tolerated treatment well    Behavior During Therapy [facility] for tasks assessed/performed              Past Medical History:  Diagnosis Date   Anemia    Arthritis    Colon polyps    CVA (cerebral vascular accident) (HCC)    CVA (cerebrovascular accident) (HCC)    L MCA   GERD (gastroesophageal reflux disease)    High cholesterol    Hypertension    Osteopenia    Palpitations    Past Surgical History:  Procedure Laterality Date   CESAREAN SECTION     Patient Active Problem List   Diagnosis Date Noted   Abdominal bloating 07/15/2020   Abnormal feces 07/15/2020   Colon cancer screening 07/15/2020   Family history of malignant neoplasm of gastrointestinal tract 07/15/2020   Generalized abdominal pain 07/15/2020   History of colonic polyps 07/15/2020   Rectal bleeding 07/15/2020   HSV (herpes simplex virus) anogenital infection 07/15/2020   Rectal itching 07/15/2020   Bladder prolapse, female, acquired 04/28/2019   Controlled type 2 diabetes mellitus with neuropathy (HCC) 10/27/2018   OA (osteoarthritis) of hip 05/24/2017   DDD (degenerative disc disease), lumbar 05/24/2017   GERD (gastroesophageal reflux disease) 02/10/2016   Essential hypertension 07/26/2015   Hyperlipidemia 07/26/2015   Insomnia secondary to depression with anxiety 07/26/2015    Osteopenia    Palpitations    Colon polyps     PCP: Donita Brooks, MD REFERRING PROVIDER: Donita Brooks, MD  REFERRING DIAG: R42 (ICD-10-CM) - Dizziness R27.0 (ICD-10-CM) - Ataxia  THERAPY DIAG:  Dizziness and giddiness  Difficulty in walking, not elsewhere classified  ONSET DATE: Patient uncertain, most probably within a year  Rationale for Evaluation and Treatment: Rehabilitation  SUBJECTIVE:   SUBJECTIVE STATEMENT: Pt states that she can only afford to come once a week.  She states bending down and turning are the greatest things that make her dizzy as well as moving to fast.   PERTINENT HISTORY: hx of CVA  PAIN:  Are you having pain? No  PRECAUTIONS: Fall  RED FLAGS: Bowel or bladder incontinence: No, Spinal tumors: No, and Cauda equina syndrome: No   WEIGHT BEARING RESTRICTIONS: No  FALLS: Has patient fallen in last 6 months? No  LIVING ENVIRONMENT: Lives with: lives with their spouse Lives in: House/apartment Stairs: Yes: External: 2 steps; none Has following equipment at home: None  PLOF: Independent and Independent with basic ADLs  PATIENT GOALS: "I don't know what my goal is. I don't know what PT is for."  OBJECTIVE:  Note: Objective measures were completed at Evaluation unless otherwise noted.  DIAGNOSTIC FINDINGS:  12/13/22 MRI HEAD WITHOUT AND WITH CONTRAST    IMPRESSION:  1. No acute intracranial abnormality. 2. 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 Moderate for age cerebral white matter and mild for age other signal changes in the bilateral brain, compatible with chronic small vessel disease.    COGNITION: Overall cognitive status: Within functional limits for tasks assessed   SENSATION: Not tested   POSTURE:  No Significant postural limitations  Cervical ROM:    Active A/PROM (deg) eval  Flexion WFL  Extension WFL  Right lateral flexion   Left lateral flexion   Right rotation WFL   Left rotation WFL  (Blank rows = not tested)   LOWER EXTREMITY MMT:   MMT Right eval Left eval  Hip flexion 4+ 4+  Hip abduction 4+ 4+  Hip adduction    Hip internal rotation    Hip external rotation    Knee flexion 4+ 4+  Knee extension 4+ 4+  Ankle dorsiflexion 4+ 4+  Ankle plantarflexion 4+ 4+  Ankle inversion    Ankle eversion    (Blank rows = not tested)  BED MOBILITY:  Sit to supine Complete Independence Supine to sit Complete Independence Rolling to Right Complete Independence Rolling to Left Complete Independence  TRANSFERS: Assistive device utilized: None  Sit to stand: Complete Independence Stand to sit: Complete Independence  GAIT: Gait pattern: decreased step length- Right, decreased step length- Left, and wide BOS Distance walked: 397 ft Assistive device utilized: None Level of assistance: Complete Independence Comments: done during  FUNCTIONAL TESTS:  5 times sit to stand: 12.96 sec 2 minute walk test: 397 ft Dynamic Gait Index: 14 MCTSIB: Condition 1: Avg of 3 trials: 30 sec, Condition 2: Avg of 3 trials: 30 sec, Condition 3: Avg of 3 trials: 30 sec, Condition 4: Avg of 3 trials: 15 sec, and Total Score: 105/120  DGI (02/20/23) 1. Gait level surface (2) Mild Impairment: Walks 20', uses assistive devices, slower speed, mild gait deviations. 2. Change in gait speed (2) Mild Impairment: Is able to change speed but demonstrates mild gait deviations, or not gait deviations but unable to achieve a significant change in velocity, or uses an assistive device. 3. Gait with horizontal head turns (1) Moderate Impairment: Performs head turns with moderate change in gait velocity, slows down, staggers but recovers, can continue to walk. 4. Gait with vertical head turns 1) Moderate Impairment: Performs head turns with moderate change in gait velocity, slows down, staggers but recovers, can continue to walk. 5. Gait and pivot turn (2) Mild Impairment:  Pivot turns safely in > 3 seconds and stops with no loss of balance. 6. Step over obstacle (2) Mild Impairment: Is able to step over box, but must slow down and adjust steps to clear box safely. 7. Step around obstacles (2) Mild Impairment: Is able to step around both cones, but must slow down and adjust steps to clear cones. 8. Stairs (2) Mild Impairment: Alternating feet, must use rail.  TOTAL SCORE: 14 / 24   PATIENT SURVEYS:  DHI to be determined next session  VESTIBULAR ASSESSMENT:  GENERAL OBSERVATION: ambulatory without assistive device   SYMPTOM BEHAVIOR: Subjective history: Patient is not sure when her symptoms started but states that it could probably almost a year now since the symptoms started. Unsure whether she had the dizziness before she had the stroke or she got dizzy after the stroke. Patient had an MRI on the brain (see above). Condition has been getting worse and reported her issue to her PCP and was then  referred to outpatient PT evaluation and management  Non-Vestibular symptoms:  none  Type of dizziness: Imbalance (Disequilibrium), Lightheadedness/Faint, and "tipsy"  Duration: few minutes  Aggravating factors: Induced by motion: bending over, Worse in the morning, and Worse outside or in busy environment  Relieving factors: rest  Progression of symptoms: worse  OCULOMOTOR EXAM:  Ocular Alignment: normal  Ocular ROM: No Limitations  Spontaneous Nystagmus: absent  Gaze-Induced Nystagmus: absent  Smooth Pursuits:  overshoots  Saccades: hypermetric/overshoots   VESTIBULAR - OCULAR REFLEX:   Slow VOR: Positive Bilaterally  VOR Cancellation: Comment: not tested  Head-Impulse Test: HIT Right: positive, corrective saccades, overshoots HIT Left: positive, corrective saccades, overshoots   POSITIONAL TESTING: Right Dix-Hallpike: no nystagmus and no vertigo but reports of dizziness Left Dix-Hallpike: no nystagmus and no vertigo but reports of dizziness Right  Roll Test: no nystagmus and no vertigo but reports of dizziness Left Roll Test: no nystagmus and no vertigo  but reports of dizziness  MOTION SENSITIVITY:  Motion Sensitivity Quotient Intensity: 0 = none, 1 = Lightheaded, 2 = Mild, 3 = Moderate, 4 = Severe, 5 = Vomiting  Intensity 11/18  1. Sitting to supine 2   2. Supine to L side    3. Supine to R side    4. Supine to sitting 2   5. L Hallpike-Dix 2   6. Up from L  2   7. R Hallpike-Dix 2   8. Up from R  2   9. Sitting, head tipped to L knee    10. Head up from L knee    11. Sitting, head tipped to R knee 2   12. Head up from R knee 2   13. Sitting head turns x5  1  14.Sitting head nods x5  1  15. In stance, 180 turn to L     16. In stance, 180 turn to R      OTHOSTATICS: Supine: 135/83 with HR 88 Sitting: 131/77 with HR 87 Standing: 126/73 with HR 92   VESTIBULAR TREATMENT:                                                                                                   DATE: 02/25/23 Seated: Gaze stability with head nods x10 Gaze stability with head turns x 10 Full cervical Rotation to improve ROM Sit to stand x 10  Tandem stance  with head turns. Tandem stance with head nods Marching x 10 with one finger touch     PATIENT EDUCATION: Education details: Educated on the pathoanatomy of vestibular hypofunction. Educated on the goals and course of rehab.  Person educated: Patient Education method: Explanation Education comprehension: verbalized understanding  HOME EXERCISE PROGRAM: Access Code: [email or web address] URL: [email or web address] Date: 02/25/2023 Prepared by: Virgina Organ  Exercises - Seated Gaze Stabilization with Head Nod  - 3 x daily - 7 x weekly - 1 sets - 10 reps - Seated Gaze Stabilization with Head Nod  - 3 x daily - 7 x weekly - 1 sets - 10 reps - Sit to Stand  - 3 x daily - 7  x weekly - 1 sets - 10 reps - Standing Tandem Balance with Counter Support  - 3 x daily - 7 x weekly - 1 sets  - 10 reps - as long as possible  hold - Standing Marching  - 2 x daily - 7 x weekly - 1 sets - 10 reps GOALS: Goals reviewed with patient? Yes  SHORT TERM GOALS: Target date: 03/06/23  Pt will demonstrate indep in HEP to facilitate carry-over of skilled services and improve functional outcomes Goal status: IN PROGRESS  LONG TERM GOALS: Target date: 03/20/23  Pt will decrease 5TSTS by at least 3 seconds in order to demonstrate clinically significant improvement in LE strength   Baseline: 12.96 sec Goal status: IN PROGRESS  2.  Pt will improve DGI by at least 6 points in order to demonstrate clinically significant improvement in balance and decreased risk for falls  Baseline: 14 Goal status: IN PROGRESS  3.  Pt will increase by at least 40 ft in order to demonstrate clinically significant improvement in community ambulation Baseline: 397 ft Goal status: IN PROGRESS  4.  Pt will demonstrate an increase score in MCTSIB to 115 to reduce risk of falls and demonstrate improvement in balance Baseline: 105/120 Goal status: IN PROGRESS  5.  Pt will demonstrate a motion sensitivity quotient of 0 in sit <> supine and with head tipping to facilitate ease and safety in ADLs and ambulation. Baseline: 2 Goal status: IN PROGRESS  6.  Pt will demonstrate intact VOR 1 (no dizziness) to facilitate safety in ADLs Baseline: corrective saccades with dizziness on VOR 1 Goal status: IN PROGRESS  ASSESSMENT:  CLINICAL IMPRESSION: Therapist reviewed evaluation and goals.  Therapist initiated HEP.  Pt is only able to come once a week financially therefore HEP needs to be updated each week.  Therapist recommended completing HEP in a corner with a chair in front of her for safety.   Skilled PT is required to address the impairments and functional limitations listed below.    OBJECTIVE IMPAIRMENTS: Abnormal gait, decreased activity tolerance, decreased balance, difficulty walking, dizziness, and  impaired flexibility.   ACTIVITY LIMITATIONS: carrying, lifting, bending, sitting, standing, squatting, and stairs  PARTICIPATION LIMITATIONS: meal prep, cleaning, laundry, shopping, community activity, and yard work  PERSONAL FACTORS: Age and Time since onset of injury/illness/exacerbation are also affecting patient's functional outcome.   REHAB POTENTIAL: Good  CLINICAL DECISION MAKING: Stable/uncomplicated  EVALUATION COMPLEXITY: Low   PLAN:  PT FREQUENCY: 2x/week  PT DURATION: 4 weeks  PLANNED INTERVENTIONS: 97164- PT Re-evaluation, 97110-Therapeutic exercises, 97530- Therapeutic activity, [id number]- Neuromuscular re-education, 97535- Self Care, 73220- Manual therapy, [phone]- Gait training, and Patient/Family education  PLAN FOR NEXT SESSION:Progress HEP as pt can only afford to come once a week.  Give nose to knee next treatment.   Gayla Carew, PT CLT [phone] 02/25/2023, 12:27 PM   [employer] Auth Request  Referring diagnosis code (ICD 10)? R42, R47 Treatment diagnosis codes (ICD 10)? (if different than referring diagnosis) R42, R26.2 What was this (referring dx) caused by? []  Surgery []  Fall []  Ongoing issue []  Arthritis [x]  Other: _vestibular___________  Laterality: []  Rt []  Lt [x]  Both  Deficits: []  Pain []  Stiffness []  Weakness []  Edema [x]  Balance Deficits []  Coordination [x]  Gait Disturbance []  ROM [x]  Other: vestibular   Functional Tool Score: Dynamic Gait Index: 14/24  CPT codes: See Planned Interventions listed in the Plan section of the Evaluation.

## 2023-02-27 ENCOUNTER — Encounter (HOSPITAL_COMMUNITY): Payer: Medicare HMO

## 2023-03-04 ENCOUNTER — Encounter (HOSPITAL_COMMUNITY): Payer: Medicare HMO | Admitting: Physical Therapy

## 2023-03-06 ENCOUNTER — Encounter: Payer: Medicare HMO | Admitting: Physical Therapy

## 2023-03-11 ENCOUNTER — Other Ambulatory Visit: Payer: Self-pay | Admitting: Family Medicine

## 2023-03-11 ENCOUNTER — Encounter (HOSPITAL_COMMUNITY): Payer: Medicare HMO | Admitting: Physical Therapy

## 2023-03-11 DIAGNOSIS — K219 Gastro-esophageal reflux disease without esophagitis: Secondary | ICD-10-CM

## 2023-03-12 ENCOUNTER — Telehealth: Payer: Self-pay

## 2023-03-12 NOTE — Telephone Encounter (Signed)
Copied from CRM [id number]. Topic: Clinical - Medical Advice >> Mar 12, 2023  4:33 PM Danika B wrote: Reason for CRM: Patient called about status of her health and whether or not she has diabetes and further details. Wants to review this in order to call back an insurance company and see what she qualifies for. Callback [phone]

## 2023-03-13 ENCOUNTER — Encounter: Payer: Medicare HMO | Admitting: Physical Therapy

## 2023-03-14 NOTE — Telephone Encounter (Signed)
Requested Prescriptions  Pending Prescriptions Disp Refills   famotidine (PEPCID) 40 MG tablet [Pharmacy Med Name: Famotidine 40 MG Oral Tablet] 90 tablet 1    Sig: Take 1 tablet by mouth once daily     Gastroenterology:  H2 Antagonists Failed - 03/11/2023  1:24 PM      Failed - Valid encounter within last 12 months    Recent Outpatient Visits           1 year ago Gastroesophageal reflux disease without esophagitis   [facility] Donita Brooks, MD   1 year ago Insomnia secondary to depression with anxiety   [facility] Valentino Nose, NP   1 year ago Encounter for annual wellness visit (AWV) in Medicare patient   [facility] Valentino Nose, NP   2 years ago HSV (herpes simplex virus) anogenital infection   [facility] Valentino Nose, NP   2 years ago Controlled type 2 diabetes mellitus with neuropathy (HCC)   [facility] Valentino Nose, NP

## 2023-03-18 ENCOUNTER — Encounter: Payer: Medicare HMO | Admitting: Physical Therapy

## 2023-03-20 ENCOUNTER — Encounter (HOSPITAL_COMMUNITY): Payer: Medicare HMO | Admitting: Physical Therapy

## 2023-04-16 ENCOUNTER — Ambulatory Visit
Admission: RE | Admit: 2023-04-16 | Discharge: 2023-04-16 | Disposition: A | Discharge status: Home / Self Care | Payer: HMO | Source: Ambulatory Visit | Attending: Family Medicine | Admitting: Family Medicine

## 2023-04-16 ENCOUNTER — Encounter: Payer: Self-pay | Admitting: Family Medicine

## 2023-04-16 ENCOUNTER — Ambulatory Visit (INDEPENDENT_AMBULATORY_CARE_PROVIDER_SITE_OTHER): Payer: HMO | Admitting: Family Medicine

## 2023-04-16 VITALS — BP 138/80 | HR 90 | Temp 97.7°F | Ht 65.0 in | Wt 176.0 lb

## 2023-04-16 DIAGNOSIS — M5442 Lumbago with sciatica, left side: Secondary | ICD-10-CM | POA: Diagnosis not present

## 2023-04-16 DIAGNOSIS — M5432 Sciatica, left side: Secondary | ICD-10-CM | POA: Diagnosis not present

## 2023-04-16 DIAGNOSIS — E1140 Type 2 diabetes mellitus with diabetic neuropathy, unspecified: Secondary | ICD-10-CM | POA: Diagnosis not present

## 2023-04-16 MED ORDER — SEMAGLUTIDE(0.25 OR 0.5MG/DOS) 2 MG/3ML ~~LOC~~ SOPN: 0.2500 mg | PEN_INJECTOR | SUBCUTANEOUS | 1 refills | Status: DC

## 2023-04-16 NOTE — Progress Notes (Signed)
 Subjective:    Patient ID: Nina Olson, female    DOB: 11-08-1945, 78 y.o.   MRN: [id number]  HPI Patient is a 78 y/o African female with history diabetes.  We had discontinued her semaglutide  last year due to epigastric pain and nausea.  She would like to resume that medication.  She states that her appetite has returned and that she is gaining weight.  She is not checking her blood sugars regularly.  She denies any polyuria, polydipsia, or blurry vision.  She does report neuropathic numbness and pain in her feet.  Diabetic foot exam was performed today and is normal.  Blood pressure today is excellent at 138/80. Past Medical History:  Diagnosis Date   Anemia    Arthritis    Colon polyps    CVA (cerebral vascular accident) (HCC)    CVA (cerebrovascular accident) (HCC)    L MCA   GERD (gastroesophageal reflux disease)    High cholesterol    Hypertension    Osteopenia    Palpitations    Past Surgical History:  Procedure Laterality Date   CESAREAN SECTION     Current Outpatient Medications on File Prior to Visit  Medication Sig Dispense Refill   Accu-Chek Softclix Lancets lancets USE TO CHECK CBG ONCE A DAY 100 each 0   atorvastatin  (LIPITOR) 80 MG tablet Take 1 tablet (80 mg total) by mouth daily. 90 tablet 3   blood glucose meter kit and supplies Dispense based on patient and insurance preference. Check CBG once a day (FOR ICD-10 E10.9, E11.9). 1 each 0   esomeprazole  (NEXIUM ) 40 MG capsule Take 1 capsule by mouth once daily 90 capsule 0   famotidine  (PEPCID ) 40 MG tablet Take 1 tablet by mouth once daily 90 tablet 1   gabapentin  (NEURONTIN ) 300 MG capsule Take 1 capsule (300 mg total) by mouth at bedtime as needed (nerve pain in feet). 30 capsule 3   glucose blood (ACCU-CHEK GUIDE) test strip USE TO CHECK CBG ONCE A DAY 100 each 6   hydrochlorothiazide  (HYDRODIURIL ) 25 MG tablet Take 0.5 tablets (12.5 mg total) by mouth daily. Pt reports she takes half of 25 mg tablet 90 tablet  3   losartan  (COZAAR ) 100 MG tablet Take 1 tablet by mouth once daily 90 tablet 0   Multiple Vitamin (MULTI-VITAMIN) tablet Take by mouth.     Omega-3 Fatty Acids (FISH OIL PO) Take by mouth as directed.     Probiotic Product (PROBIOTIC DAILY PO) Take by mouth.     sucralfate  (CARAFATE ) 1 g tablet TAKE 1 TABLET BY MOUTH 4 TIMES DAILY WITH  MEALS  AND  AT  BEDTIME. 120 tablet 0   valACYclovir  (VALTREX ) 1000 MG tablet Take 1 tablet (1,000 mg total) by mouth 2 (two) times daily. (Patient taking differently: Take 1,000 mg by mouth 2 (two) times daily. Takes as needed.) 14 tablet 5   amoxicillin -clavulanate (AUGMENTIN ) 875-125 MG tablet Take 1 tablet by mouth 2 (two) times daily. (Patient not taking: Reported on 04/16/2023) 20 tablet 0   traZODone  (DESYREL ) 50 MG tablet Take 0.5-1 tablets (25-50 mg total) by mouth at bedtime as needed for sleep. (Patient not taking: Reported on 04/16/2023) 90 tablet 1   No current facility-administered medications on file prior to visit.   No Known Allergies Social History   Socioeconomic History   Marital status: Married    Spouse name: Not on file   Number of children: 3   Years of education: Not on  file   Highest education level: Not on file  Occupational History   Not on file  Tobacco Use   Smoking status: Former   Smokeless tobacco: Former    Quit date: 04/09/1978  Vaping Use   Vaping status: Never Used  Substance and Sexual Activity   Alcohol use: No   Drug use: No   Sexual activity: Never  Other Topics Concern   Not on file  Social History Narrative   Are you right handed or left handed? Right   Are you currently employed ? no   What is your current occupation?retired   Do you live at home alone? No    Who lives with you? husband   What type of home do you live in: 1 story or 2 story? one    Caffeine 1 soda a day    Social Drivers of Corporate Investment Banker Strain: Low Risk  (04/17/2022)   Overall Financial Resource Strain (CARDIA)     Difficulty of Paying Living Expenses: Not hard at all  Food Insecurity: No Food Insecurity (07/31/2022)   Hunger Vital Sign    Worried About Running Out of Food in the Last Year: Never true    Ran Out of Food in the Last Year: Never true  Transportation Needs: No Transportation Needs (07/31/2022)   PRAPARE - Administrator, Civil Service (Medical): No    Lack of Transportation (Non-Medical): No  Physical Activity: Insufficiently Active (04/17/2022)   Exercise Vital Sign    Days of Exercise per Week: 3 days    Minutes of Exercise per Session: 30 min  Stress: No Stress Concern Present (04/17/2022)   [employer] of Occupational Health - Occupational Stress Questionnaire    Feeling of Stress : Not at all  Social Connections: Socially Integrated (04/17/2022)   Social Connection and Isolation Panel [NHANES]    Frequency of Communication with Friends and Family: More than three times a week    Frequency of Social Gatherings with Friends and Family: Three times a week    Attends Religious Services: More than 4 times per year    Active Member of Clubs or Organizations: Yes    Attends Banker Meetings: More than 4 times per year    Marital Status: Married  Catering Manager Violence: Not At Risk (07/31/2022)   Humiliation, Afraid, Rape, and Kick questionnaire    Fear of Current or Ex-Partner: No    Emotionally Abused: No    Physically Abused: No    Sexually Abused: No     Review of Systems  All other systems reviewed and are negative.      Objective:   Physical Exam Constitutional:      Appearance: Normal appearance. She is normal weight.  HENT:     Right Ear: Tympanic membrane, ear canal and external ear normal.     Left Ear: Tympanic membrane, ear canal and external ear normal.     Mouth/Throat:     Mouth: Mucous membranes are moist.     Pharynx: No oropharyngeal exudate or posterior oropharyngeal erythema.  Eyes:     General: No scleral icterus.     Extraocular Movements: Extraocular movements intact.     Conjunctiva/sclera: Conjunctivae normal.     Pupils: Pupils are equal, round, and reactive to light.  Neck:     Vascular: No carotid bruit.  Cardiovascular:     Rate and Rhythm: Normal rate and regular rhythm.     Pulses: Normal  pulses.     Heart sounds: Normal heart sounds. No murmur heard.    No friction rub. No gallop.  Pulmonary:     Effort: Pulmonary effort is normal. No respiratory distress.     Breath sounds: Normal breath sounds. No stridor. No wheezing, rhonchi or rales.  Abdominal:     General: Bowel sounds are normal. There is no distension.     Palpations: Abdomen is soft. There is no mass.     Tenderness: There is no abdominal tenderness. There is no guarding or rebound.  Musculoskeletal:     Cervical back: Normal range of motion and neck supple.     Right lower leg: No edema.     Left lower leg: No edema.  Lymphadenopathy:     Cervical: No cervical adenopathy.  Skin:    Findings: No erythema or rash.  Neurological:     General: No focal deficit present.     Mental Status: She is alert and oriented to person, place, and time.     Cranial Nerves: No cranial nerve deficit.     Sensory: No sensory deficit.     Motor: No weakness.     Coordination: Coordination normal.     Gait: Gait normal.     Deep Tendon Reflexes: Reflexes normal.  Psychiatric:        Mood and Affect: Mood normal.        Behavior: Behavior normal.        Thought Content: Thought content normal.        Judgment: Judgment normal.           Assessment & Plan:  Controlled type 2 diabetes mellitus with neuropathy (HCC) - Plan: Hemoglobin A1c, Protein / Creatinine Ratio, Urine, Hemoglobin A1c, CBC with Differential/Platelet, COMPLETE METABOLIC PANEL WITH GFR, Lipid panel, Microalbumin / creatinine urine ratio  Left sided sciatica - Plan: DG Lumbar Spine Complete, Ambulatory referral to Physical Therapy I am very happy with her blood  pressure.  Diabetic foot exam is normal.  I will check a CBC a CMP and a lipid panel and an A1c.  I would like her A1c to be less than 6.5.  We will resume Ozempic  0.25 mg subcu weekly.  We can uptitrate the medication if she tolerates this.  I will also check a urine protein creatinine ratio.  The patient also states that she has been dealing with left-sided sciatica for the last several weeks.  The pain begins in her gluteus and radiates down her left thigh into her left leg and left foot.  She would like to try physical therapy.  I will arrange this for her.  Also will schedule the patient to get an x-ray of the lumbar spine to evaluate further

## 2023-04-17 LAB — CBC WITH DIFFERENTIAL/PLATELET
Absolute Lymphocytes: 1216 {cells}/uL (ref 850–3900)
Absolute Monocytes: 604 {cells}/uL (ref 200–950)
Basophils Absolute: 20 {cells}/uL (ref 0–200)
Basophils Relative: 0.5 %
Eosinophils Absolute: 280 {cells}/uL (ref 15–500)
Eosinophils Relative: 7 %
HCT: 40.2 % (ref 35.0–45.0)
Hemoglobin: 12.7 g/dL (ref 11.7–15.5)
MCH: 26.8 pg — ABNORMAL LOW (ref 27.0–33.0)
MCHC: 31.6 g/dL — ABNORMAL LOW (ref 32.0–36.0)
MCV: 84.8 fL (ref 80.0–100.0)
MPV: 9.6 fL (ref 7.5–12.5)
Monocytes Relative: 15.1 %
Neutro Abs: 1880 {cells}/uL (ref 1500–7800)
Neutrophils Relative %: 47 %
Platelets: 365 10*3/uL (ref 140–400)
RBC: 4.74 10*6/uL (ref 3.80–5.10)
RDW: 13.7 % (ref 11.0–15.0)
Total Lymphocyte: 30.4 %
WBC: 4 10*3/uL (ref 3.8–10.8)

## 2023-04-17 LAB — COMPLETE METABOLIC PANEL WITH GFR
AG Ratio: 1.4 (calc) (ref 1.0–2.5)
ALT: 19 U/L (ref 6–29)
AST: 17 U/L (ref 10–35)
Albumin: 4.2 g/dL (ref 3.6–5.1)
Alkaline phosphatase (APISO): 126 U/L (ref 37–153)
BUN: 11 mg/dL (ref 7–25)
CO2: 30 mmol/L (ref 20–32)
Calcium: 9.6 mg/dL (ref 8.6–10.4)
Chloride: 107 mmol/L (ref 98–110)
Creat: 0.94 mg/dL (ref 0.60–1.00)
Globulin: 3 g/dL (ref 1.9–3.7)
Glucose, Bld: 107 mg/dL — ABNORMAL HIGH (ref 65–99)
Potassium: 4.1 mmol/L (ref 3.5–5.3)
Sodium: 145 mmol/L (ref 135–146)
Total Bilirubin: 0.6 mg/dL (ref 0.2–1.2)
Total Protein: 7.2 g/dL (ref 6.1–8.1)
eGFR: 63 mL/min/{1.73_m2} (ref >=60)

## 2023-04-17 LAB — HEMOGLOBIN A1C
Hgb A1c MFr Bld: 7.6 %{Hb} — ABNORMAL HIGH (ref <5.7)
Mean Plasma Glucose: 171 mg/dL
eAG (mmol/L): 9.5 mmol/L

## 2023-04-17 LAB — LIPID PANEL
Cholesterol: 161 mg/dL (ref <200)
HDL: 50 mg/dL (ref >=50)
LDL Cholesterol (Calc): 95 mg/dL
Non-HDL Cholesterol (Calc): 111 mg/dL (ref <130)
Total CHOL/HDL Ratio: 3.2 (calc) (ref <5.0)
Triglycerides: 73 mg/dL (ref <150)

## 2023-04-17 LAB — PROTEIN / CREATININE RATIO, URINE
Creatinine, Urine: 182 mg/dL (ref 20–275)
Protein/Creat Ratio: 121 mg/g{creat} (ref 24–184)
Protein/Creatinine Ratio: 0.121 mg/mg{creat} (ref 0.024–0.184)
Total Protein, Urine: 22 mg/dL (ref 5–24)

## 2023-04-17 LAB — MICROALBUMIN / CREATININE URINE RATIO
Creatinine, Urine: 182 mg/dL (ref 20–275)
Microalb Creat Ratio: 15 mg/g{creat} (ref <30)
Microalb, Ur: 2.7 mg/dL

## 2023-04-25 ENCOUNTER — Ambulatory Visit (INDEPENDENT_AMBULATORY_CARE_PROVIDER_SITE_OTHER): Payer: HMO | Admitting: *Deleted

## 2023-04-25 DIAGNOSIS — Z780 Asymptomatic menopausal state: Secondary | ICD-10-CM

## 2023-04-25 DIAGNOSIS — Z0000 Encounter for general adult medical examination without abnormal findings: Secondary | ICD-10-CM | POA: Diagnosis not present

## 2023-04-25 NOTE — Patient Instructions (Signed)
Nina Olson , Thank you for taking time to come for your Medicare Wellness Visit. I appreciate your ongoing commitment to your health goals. Please review the following plan we discussed and let me know if I can assist you in the future.   Screening recommendations/referrals: Colonoscopy: no longer required Mammogram: up to date Bone Density: ordered Recommended yearly ophthalmology/optometry visit for glaucoma screening and checkup Recommended yearly dental visit for hygiene and checkup  Vaccinations: Influenza vaccine: up to date Pneumococcal vaccine: up to date Tdap vaccine: up to date Shingles vaccine: up to date       Preventive Care 65 Years and Older, Female Preventive care refers to lifestyle choices and visits with your health care provider that can promote health and wellness. What does preventive care include? A yearly physical exam. This is also called an annual well check. Dental exams once or twice a year. Routine eye exams. Ask your health care provider how often you should have your eyes checked. Personal lifestyle choices, including: Daily care of your teeth and gums. Regular physical activity. Eating a healthy diet. Avoiding tobacco and drug use. Limiting alcohol use. Practicing safe sex. Taking low-dose aspirin every day. Taking vitamin and mineral supplements as recommended by your health care provider. What happens during an annual well check? The services and screenings done by your health care provider during your annual well check will depend on your age, overall health, lifestyle risk factors, and family history of disease. Counseling  Your health care provider may ask you questions about your: Alcohol use. Tobacco use. Drug use. Emotional well-being. Home and relationship well-being. Sexual activity. Eating habits. History of falls. Memory and ability to understand (cognition). Work and work Astronomer. Reproductive health. Screening  You may  have the following tests or measurements: Height, weight, and BMI. Blood pressure. Lipid and cholesterol levels. These may be checked every 5 years, or more frequently if you are over 52 years old. Skin check. Lung cancer screening. You may have this screening every year starting at age 40 if you have a 30-pack-year history of smoking and currently smoke or have quit within the past 15 years. Fecal occult blood test (FOBT) of the stool. You may have this test every year starting at age 40. Flexible sigmoidoscopy or colonoscopy. You may have a sigmoidoscopy every 5 years or a colonoscopy every 10 years starting at age 79. Hepatitis C blood test. Hepatitis B blood test. Sexually transmitted disease (STD) testing. Diabetes screening. This is done by checking your blood sugar (glucose) after you have not eaten for a while (fasting). You may have this done every 1-3 years. Bone density scan. This is done to screen for osteoporosis. You may have this done starting at age 3. Mammogram. This may be done every 1-2 years. Talk to your health care provider about how often you should have regular mammograms. Talk with your health care provider about your test results, treatment options, and if necessary, the need for more tests. Vaccines  Your health care provider may recommend certain vaccines, such as: Influenza vaccine. This is recommended every year. Tetanus, diphtheria, and acellular pertussis (Tdap, Td) vaccine. You may need a Td booster every 10 years. Zoster vaccine. You may need this after age 30. Pneumococcal 13-valent conjugate (PCV13) vaccine. One dose is recommended after age 14. Pneumococcal polysaccharide (PPSV23) vaccine. One dose is recommended after age 45. Talk to your health care provider about which screenings and vaccines you need and how often you need them. This information  is not intended to replace advice given to you by your health care provider. Make sure you discuss any  questions you have with your health care provider. Document Released: 04/22/2015 Document Revised: 12/14/2015 Document Reviewed: 01/25/2015 Elsevier Interactive Patient Education  2017 [employer].  Fall Prevention in the Home Falls can cause injuries. They can happen to people of all ages. There are many things you can do to make your home safe and to help prevent falls. What can I do on the outside of my home? Regularly fix the edges of walkways and driveways and fix any cracks. Remove anything that might make you trip as you walk through a door, such as a raised step or threshold. Trim any bushes or trees on the path to your home. Use bright outdoor lighting. Clear any walking paths of anything that might make someone trip, such as rocks or tools. Regularly check to see if handrails are loose or broken. Make sure that both sides of any steps have handrails. Any raised decks and porches should have guardrails on the edges. Have any leaves, snow, or ice cleared regularly. Use sand or salt on walking paths during winter. Clean up any spills in your garage right away. This includes oil or grease spills. What can I do in the bathroom? Use night lights. Install grab bars by the toilet and in the tub and shower. Do not use towel bars as grab bars. Use non-skid mats or decals in the tub or shower. If you need to sit down in the shower, use a plastic, non-slip stool. Keep the floor dry. Clean up any water that spills on the floor as soon as it happens. Remove soap buildup in the tub or shower regularly. Attach bath mats securely with double-sided non-slip rug tape. Do not have throw rugs and other things on the floor that can make you trip. What can I do in the bedroom? Use night lights. Make sure that you have a light by your bed that is easy to reach. Do not use any sheets or blankets that are too big for your bed. They should not hang down onto the floor. Have a firm chair that has side  arms. You can use this for support while you get dressed. Do not have throw rugs and other things on the floor that can make you trip. What can I do in the kitchen? Clean up any spills right away. Avoid walking on wet floors. Keep items that you use a lot in easy-to-reach places. If you need to reach something above you, use a strong step stool that has a grab bar. Keep electrical cords out of the way. Do not use floor polish or wax that makes floors slippery. If you must use wax, use non-skid floor wax. Do not have throw rugs and other things on the floor that can make you trip. What can I do with my stairs? Do not leave any items on the stairs. Make sure that there are handrails on both sides of the stairs and use them. Fix handrails that are broken or loose. Make sure that handrails are as long as the stairways. Check any carpeting to make sure that it is firmly attached to the stairs. Fix any carpet that is loose or worn. Avoid having throw rugs at the top or bottom of the stairs. If you do have throw rugs, attach them to the floor with carpet tape. Make sure that you have a light switch at the top of  the stairs and the bottom of the stairs. If you do not have them, ask someone to add them for you. What else can I do to help prevent falls? Wear shoes that: Do not have high heels. Have rubber bottoms. Are comfortable and fit you well. Are closed at the toe. Do not wear sandals. If you use a stepladder: Make sure that it is fully opened. Do not climb a closed stepladder. Make sure that both sides of the stepladder are locked into place. Ask someone to hold it for you, if possible. Clearly mark and make sure that you can see: Any grab bars or handrails. First and last steps. Where the edge of each step is. Use tools that help you move around (mobility aids) if they are needed. These include: Canes. Walkers. Scooters. Crutches. Turn on the lights when you go into a dark area.  Replace any light bulbs as soon as they burn out. Set up your furniture so you have a clear path. Avoid moving your furniture around. If any of your floors are uneven, fix them. If there are any pets around you, be aware of where they are. Review your medicines with your doctor. Some medicines can make you feel dizzy. This can increase your chance of falling. Ask your doctor what other things that you can do to help prevent falls. This information is not intended to replace advice given to you by your health care provider. Make sure you discuss any questions you have with your health care provider. Document Released: 01/20/2009 Document Revised: 09/01/2015 Document Reviewed: 04/30/2014 Elsevier Interactive Patient Education  2017 [employer].

## 2023-04-25 NOTE — Progress Notes (Signed)
Subjective:   Nina Olson is a 78 y.o. female who presents for Medicare Annual (Subsequent) preventive examination.  Visit Complete: Virtual I connected with  Luz Brazen on 04/25/23 by a audio enabled telemedicine application and verified that I am speaking with the correct person using two identifiers.  Patient Location: Home  Provider Location: Home Office  I discussed the limitations of evaluation and management by telemedicine. The patient expressed understanding and agreed to proceed.  Vital Signs: Because this visit was a virtual/telehealth visit, some criteria may be missing or patient reported. Any vitals not documented were not able to be obtained and vitals that have been documented are patient reported.  Unable to do video    Cardiac Risk Factors include: advanced age (>78men, >28 women);hypertension;diabetes mellitus     Objective:    There were no vitals filed for this visit. There is no height or weight on file to calculate BMI.     04/25/2023   10:58 AM 02/20/2023   10:21 AM 04/26/2022    7:55 AM 04/17/2022    8:55 AM 11/28/2020    2:18 PM 11/16/2020    3:37 PM 03/16/2020    9:43 AM  Advanced Directives  Does Patient Have a Medical Advance Directive? No No No No No No No  Would patient like information on creating a medical advance directive? No - Patient declined Yes (MAU/Ambulatory/Procedural Areas - Information given)  Yes (MAU/Ambulatory/Procedural Areas - Information given) Yes (MAU/Ambulatory/Procedural Areas - Information given)  Yes (MAU/Ambulatory/Procedural Areas - Information given)    Current Medications (verified) Outpatient Encounter Medications as of 04/25/2023  Medication Sig   Accu-Chek Softclix Lancets lancets USE TO CHECK CBG ONCE A DAY   amoxicillin-clavulanate (AUGMENTIN) 875-125 MG tablet Take 1 tablet by mouth 2 (two) times daily.   atorvastatin (LIPITOR) 80 MG tablet Take 1 tablet (80 mg total) by mouth daily.   blood glucose meter  kit and supplies Dispense based on patient and insurance preference. Check CBG once a day (FOR ICD-10 E10.9, E11.9).   esomeprazole (NEXIUM) 40 MG capsule Take 1 capsule by mouth once daily   famotidine (PEPCID) 40 MG tablet Take 1 tablet by mouth once daily   gabapentin (NEURONTIN) 300 MG capsule Take 1 capsule (300 mg total) by mouth at bedtime as needed (nerve pain in feet).   glucose blood (ACCU-CHEK GUIDE) test strip USE TO CHECK CBG ONCE A DAY   hydrochlorothiazide (HYDRODIURIL) 25 MG tablet Take 0.5 tablets (12.5 mg total) by mouth daily. Pt reports she takes half of 25 mg tablet   losartan (COZAAR) 100 MG tablet Take 1 tablet by mouth once daily   Multiple Vitamin (MULTI-VITAMIN) tablet Take by mouth.   Omega-3 Fatty Acids (FISH OIL PO) Take by mouth as directed.   Probiotic Product (PROBIOTIC DAILY PO) Take by mouth.   Semaglutide,0.25 or 0.5MG /DOS, 2 MG/3ML SOPN Inject 0.25 mg into the skin once a week.   sucralfate (CARAFATE) 1 g tablet TAKE 1 TABLET BY MOUTH 4 TIMES DAILY WITH  MEALS  AND  AT  BEDTIME.   traZODone (DESYREL) 50 MG tablet Take 0.5-1 tablets (25-50 mg total) by mouth at bedtime as needed for sleep.   valACYclovir (VALTREX) 1000 MG tablet Take 1 tablet (1,000 mg total) by mouth 2 (two) times daily. (Patient taking differently: Take 1,000 mg by mouth 2 (two) times daily. Takes as needed.)   No facility-administered encounter medications on file as of 04/25/2023.    Allergies (verified) Patient  has no known allergies.   History: Past Medical History:  Diagnosis Date   Anemia    Arthritis    Colon polyps    CVA (cerebral vascular accident) (HCC)    CVA (cerebrovascular accident) (HCC)    L MCA   GERD (gastroesophageal reflux disease)    High cholesterol    Hypertension    Osteopenia    Palpitations    Past Surgical History:  Procedure Laterality Date   CESAREAN SECTION     Family History  Problem Relation Age of Onset   Hypertension Mother     Osteoporosis Mother    Breast cancer Neg Hx    Social History   Socioeconomic History   Marital status: Married    Spouse name: Not on file   Number of children: 3   Years of education: Not on file   Highest education level: Not on file  Occupational History   Not on file  Tobacco Use   Smoking status: Former   Smokeless tobacco: Former    Quit date: 04/09/1978  Vaping Use   Vaping status: Never Used  Substance and Sexual Activity   Alcohol use: No   Drug use: No   Sexual activity: Never  Other Topics Concern   Not on file  Social History Narrative   Are you right handed or left handed? Right   Are you currently employed ? no   What is your current occupation?retired   Do you live at home alone? No    Who lives with you? husband   What type of home do you live in: 1 story or 2 story? one    Caffeine 1 soda a day    Social Drivers of Corporate investment banker Strain: Low Risk  (04/25/2023)   Overall Financial Resource Strain (CARDIA)    Difficulty of Paying Living Expenses: Not hard at all  Food Insecurity: No Food Insecurity (04/25/2023)   Hunger Vital Sign    Worried About Running Out of Food in the Last Year: Never true    Ran Out of Food in the Last Year: Never true  Transportation Needs: No Transportation Needs (04/25/2023)   PRAPARE - Administrator, Civil Service (Medical): No    Lack of Transportation (Non-Medical): No  Physical Activity: Inactive (04/25/2023)   Exercise Vital Sign    Days of Exercise per Week: 0 days    Minutes of Exercise per Session: 0 min  Stress: No Stress Concern Present (04/25/2023)   [employer] of Occupational Health - Occupational Stress Questionnaire    Feeling of Stress : Not at all  Social Connections: Socially Integrated (04/25/2023)   Social Connection and Isolation Panel [NHANES]    Frequency of Communication with Friends and Family: Three times a week    Frequency of Social Gatherings with Friends and Family:  Once a week    Attends Religious Services: More than 4 times per year    Active Member of [employer] or Organizations: Yes    Attends Engineer, structural: More than 4 times per year    Marital Status: Married    Tobacco Counseling Counseling given: Not Answered   Clinical Intake:  Pre-visit preparation completed: Yes  Pain : No/denies pain     Diabetes: Yes CBG done?: No Did pt. bring in CBG monitor from home?: No  How often do you need to have someone help you when you read instructions, pamphlets, or other written materials from your doctor  or pharmacy?: 1 - Never  Interpreter Needed?: No  Information entered by :: Remi Haggard LPN   Activities of Daily Living    04/25/2023   11:05 AM  In your present state of health, do you have any difficulty performing the following activities:  Hearing? 0  Vision? 0  Difficulty concentrating or making decisions? 0  Walking or climbing stairs? 0  Dressing or bathing? 0  Doing errands, shopping? 0  Preparing Food and eating ? N  Using the Toilet? N  In the past six months, have you accidently leaked urine? Y  Do you have problems with loss of bowel control? N  Managing your Medications? N  Managing your Finances? N  Housekeeping or managing your Housekeeping? N    Patient Care Team: Donita Brooks, MD as PCP - General (Family Medicine) Antony Madura, MD as Consulting Physician (Neurology) Shellia Cleverly, DO as Consulting Physician (Gastroenterology) Edwin Cap, DPM as Consulting Physician (Podiatry)  Indicate any recent Medical Services you may have received from other than [facility] providers in the past year (date may be approximate).     Assessment:   This is a routine wellness examination for [place].  Hearing/Vision screen Hearing Screening - Comments:: No trouble hearing Vision Screening - Comments:: Up to date  [facility]   Goals Addressed             This Visit's Progress    Weight  (lb) < 200 lb (90.7 kg)         Depression Screen    04/25/2023   11:04 AM 04/16/2023    8:21 AM 10/30/2022    8:01 AM 08/31/2022    8:05 AM 06/04/2022    9:30 AM 04/17/2022    8:55 AM 12/13/2021   10:10 AM  PHQ 2/9 Scores  PHQ - 2 Score 3 0 0 0 1 3 3   PHQ- 9 Score 6    1 8 12     Fall Risk    04/25/2023   11:00 AM 04/16/2023    8:21 AM 10/30/2022    8:01 AM 08/31/2022    8:05 AM 06/04/2022    9:30 AM  Fall Risk   Falls in the past year? 0 0 0 0 0  Number falls in past yr: 0 0 0 0 0  Injury with Fall? 0 0 0 0 0  Risk for fall due to :  No Fall Risks No Fall Risks No Fall Risks No Fall Risks  Follow up Falls evaluation completed;Education provided;Falls prevention discussed Falls prevention discussed Falls prevention discussed Falls prevention discussed Falls prevention discussed    MEDICARE RISK AT HOME: Medicare Risk at Home Any stairs in or around the home?: No If so, are there any without handrails?: No Home free of loose throw rugs in walkways, pet beds, electrical cords, etc?: Yes Adequate lighting in your home to reduce risk of falls?: Yes Life alert?: No Use of a cane, walker or w/c?: No Grab bars in the bathroom?: No Shower chair or bench in shower?: No Elevated toilet seat or a handicapped toilet?: No  TIMED UP AND GO:  Was the test performed?  No    Cognitive Function:        04/25/2023   11:06 AM 04/17/2022    8:54 AM 03/20/2021    8:52 AM 11/16/2020    3:37 PM  6CIT Screen  What Year? 0 points 0 points 0 points 0 points  What month? 0 points  0 points 0 points 0 points  What time? 0 points 0 points 0 points 0 points  Count back from 20 0 points 0 points 0 points 0 points  Months in reverse 0 points 0 points 0 points 0 points  Repeat phrase 0 points 2 points 0 points 0 points  Total Score 0 points 2 points 0 points 0 points    Immunizations Immunization History  Administered Date(s) Administered   Fluad Quad(high Dose 65+) 02/02/2019, 01/09/2021,  12/14/2021, 01/31/2023   Influenza, High Dose Seasonal PF 01/26/2017, 02/12/2018   Influenza,inj,Quad PF,6+ Mos 03/19/2013, 02/10/2016   Influenza-Unspecified 12/31/2007, 12/30/2013   PFIZER(Purple Top)SARS-COV-2 Vaccination 05/14/2019, 06/04/2019, 01/14/2020   Pfizer(Comirnaty)Fall Seasonal Vaccine 12 years and older 02/16/2022   Pneumococcal Conjugate-13 03/19/2013   Pneumococcal Polysaccharide-23 02/19/2012, 01/01/2014   Tdap 05/26/2014   Zoster Recombinant(Shingrix) 12/13/2021, 02/16/2022   Zoster, Live 07/27/2015    TDAP status: Up to date  Flu Vaccine status: Up to date  Pneumococcal vaccine status: Up to date  Covid-19 vaccine status: Information provided on how to obtain vaccines.   Qualifies for Shingles Vaccine? No   Zostavax completed Yes   Shingrix Completed?: Yes  Screening Tests Health Maintenance  Topic Date Due   COVID-19 Vaccine (5 - 2024-25 season) 05/17/2023 (Originally 12/09/2022)   Colonoscopy  08/31/2023 (Originally 02/03/2022)   OPHTHALMOLOGY EXAM  08/19/2024 (Originally 12/27/2022)   HEMOGLOBIN A1C  10/14/2023   Diabetic kidney evaluation - eGFR measurement  04/15/2024   Diabetic kidney evaluation - Urine ACR  04/15/2024   FOOT EXAM  04/15/2024   Medicare Annual Wellness (AWV)  04/24/2024   DTaP/Tdap/Td (2 - Td or Tdap) 05/26/2024   Pneumonia Vaccine 29+ Years old  Completed   INFLUENZA VACCINE  Completed   DEXA SCAN  Completed   Hepatitis C Screening  Completed   Zoster Vaccines- Shingrix  Completed   HPV VACCINES  Aged Out    Health Maintenance  There are no preventive care reminders to display for this patient.   Colonoscopy   Education provided   Mammogram status: Completed  . Repeat every year  Bone Density status: Ordered 2022. Pt provided with contact info and advised to call to schedule appt.  Lung Cancer Screening: (Low Dose CT Chest recommended if Age 45-80 years, 20 pack-year currently smoking OR have quit w/in 15years.) does not  qualify.   Lung Cancer Screening Referral:   Additional Screening:  Hepatitis C Screening: does not qualify; Completed 2018  Vision Screening: Recommended annual ophthalmology exams for early detection of glaucoma and other disorders of the eye. Is the patient up to date with their annual eye exam?  No  Who is the provider or what is the name of the office in which the patient attends annual eye exams? [facility] If pt is not established with a provider, would they like to be referred to a provider to establish care? No .   Dental Screening: Recommended annual dental exams for proper oral hygiene  Nutrition Risk Assessment:  Has the patient had any N/V/D within the last 2 months?  No  Does the patient have any non-healing wounds?  No  Has the patient had any unintentional weight loss or weight gain?  No   Diabetes:  Is the patient diabetic?  Yes  If diabetic, was a CBG obtained today?  Yes  Did the patient bring in their glucometer from home?  No  How often do you monitor your CBG's? 1-2 x a week.   Financial  Strains and Diabetes Management:  Are you having any financial strains with the device, your supplies or your medication? No .  Does the patient want to be seen by Chronic Care Management for management of their diabetes?  No  Would the patient like to be referred to a Nutritionist or for Diabetic Management?  No   Diabetic Exams:  Diabetic Eye Exam: Pt has been advised about the importance in completing this exam.  Diabetic Foot Exam: . Pt has been advised about the importance in completing this exam. .    Community Resource Referral / Chronic Care Management: CRR required this visit?  No   CCM required this visit?  No     Plan:     I have personally reviewed and noted the following in the patient's chart:   Medical and social history Use of alcohol, tobacco or illicit drugs  Current medications and supplements including opioid prescriptions. Patient is not  currently taking opioid prescriptions. Functional ability and status Nutritional status Physical activity Advanced directives List of other physicians Hospitalizations, surgeries, and ER visits in previous 12 months Vitals Screenings to include cognitive, depression, and falls Referrals and appointments  In addition, I have reviewed and discussed with patient certain preventive protocols, quality metrics, and best practice recommendations. A written personalized care plan for preventive services as well as general preventive health recommendations were provided to patient.     Remi Haggard, LPN   5/62/[id number]   After Visit Summary: (MyChart) Due to this being a telephonic visit, the after visit summary with patients personalized plan was offered to patient via MyChart   Nurse Notes:

## 2023-04-26 ENCOUNTER — Ambulatory Visit: Payer: PPO | Attending: Family Medicine | Admitting: Physical Therapy

## 2023-04-26 ENCOUNTER — Encounter: Payer: Self-pay | Admitting: Physical Therapy

## 2023-04-26 DIAGNOSIS — M5416 Radiculopathy, lumbar region: Secondary | ICD-10-CM | POA: Diagnosis not present

## 2023-04-26 DIAGNOSIS — M5432 Sciatica, left side: Secondary | ICD-10-CM | POA: Insufficient documentation

## 2023-04-26 NOTE — Therapy (Signed)
OUTPATIENT PHYSICAL THERAPY THORACOLUMBAR EVALUATION   Patient Name: Nina Olson MRN: [id number] DOB:September 22, 1945, 78 y.o., female Today's Date: 04/26/2023  END OF SESSION:  PT End of Session - 04/26/23 0853     Visit Number 1    Number of Visits 8    Date for PT Re-Evaluation 06/21/23    Authorization Type [employer]    PT Start Time 0846    PT Stop Time 0930    PT Time Calculation (min) 44 min    Activity Tolerance Patient tolerated treatment well    Behavior During Therapy [facility] for tasks assessed/performed             Past Medical History:  Diagnosis Date   Anemia    Arthritis    Colon polyps    CVA (cerebral vascular accident) (HCC)    CVA (cerebrovascular accident) (HCC)    L MCA   GERD (gastroesophageal reflux disease)    High cholesterol    Hypertension    Osteopenia    Palpitations    Past Surgical History:  Procedure Laterality Date   CESAREAN SECTION     Patient Active Problem List   Diagnosis Date Noted   Abdominal bloating 07/15/2020   Abnormal feces 07/15/2020   Colon cancer screening 07/15/2020   Family history of malignant neoplasm of gastrointestinal tract 07/15/2020   Generalized abdominal pain 07/15/2020   History of colonic polyps 07/15/2020   Rectal bleeding 07/15/2020   HSV (herpes simplex virus) anogenital infection 07/15/2020   Rectal itching 07/15/2020   Bladder prolapse, female, acquired 04/28/2019   Controlled type 2 diabetes mellitus with neuropathy (HCC) 10/27/2018   OA (osteoarthritis) of hip 05/24/2017   DDD (degenerative disc disease), lumbar 05/24/2017   GERD (gastroesophageal reflux disease) 02/10/2016   Essential hypertension 07/26/2015   Hyperlipidemia 07/26/2015   Insomnia secondary to depression with anxiety 07/26/2015   Osteopenia    Palpitations    Colon polyps     PCP: Lynnea Ferrier MD   REFERRING PROVIDER: Lynnea Ferrier MD   REFERRING DIAG:  Diagnosis  M54.32 (ICD-10-CM) - Left sided  sciatica    Rationale for Evaluation and Treatment: Rehabilitation  THERAPY DIAG:  Radiculopathy, lumbar region  ONSET DATE: approx. 5-6 yrs   SUBJECTIVE:                                                                                                                                                                                           SUBJECTIVE STATEMENT: Patient as had this issue for 5-6 years.  It used to ease off after a few days. The pain is from her lower back  to her hip/thigh, stops at the knee.  Sometimes the Rt hip feels that way too.  Her symptoms limit her from /  She had it a couple of weeks ago, had to get a wheelchair to et on the plane.  At that time she could barely walking. She has some soreness now. She has periods of pain that last about 3 days . Her leg does feel a bit weak.  Cramping. She does have LE numbness due to neuropathy but only in her feet.  Like I'm walking on cardboard. 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   PERTINENT HISTORY:  Diagnosis  M54.32 (ICD-10-CM) - Left sided sciatica   Vertigo  Donita Brooks, MD  PAIN:  Are you having pain? Yes: NPRS scale: 2/10 Pain location: Lt low back  Pain description: sore today but can be severe and spasm when episodes occur Aggravating factors: turning wrong, lifting, bending  Relieving factors: Pain muscle rub, laying down  Pain can be 9/10 excruciating.    PRECAUTIONS: None  RED FLAGS: None   WEIGHT BEARING RESTRICTIONS: No  FALLS:  Has patient fallen in last 6 months? No  LIVING ENVIRONMENT: Lives with: lives with their spouse Lives in: House/apartment Stairs: No Has following equipment at home: None  OCCUPATION: Retired, Psychologist, occupational  PLOF: Independent gardening, travel  PATIENT GOALS: I want to be able to get to the point where I wont be in pain   NEXT MD VISIT: as needed  OBJECTIVE:  Note: Objective measures were completed at Evaluation unless otherwise noted.  DIAGNOSTIC  FINDINGS:  XR 04/20/23:  IMPRESSION: Multilevel degenerative changes of the lumbar spine greatest involving the facets at L5-S1.  PATIENT SURVEYS:  FOTO 61%  goal is 65%  COGNITION: Overall cognitive status: Within functional limits for tasks assessed     SENSATION: Pt reports L > R numbness in foot   MUSCLE LENGTH: Hamstrings: WFL Thomas test: Tight quads and hip flexors   POSTURE: increased thoracic kyphosis  PALPATION: Soreness to palpation along Lt L5-S1 paraspinals and centrally   LUMBAR ROM:   AROM eval  Flexion WFL, no pain   Extension 50%   Right lateral flexion WFL  Left lateral flexion WFL  Right rotation 25%  Left rotation 25%   (Blank rows = not tested)  LOWER EXTREMITY ROM:     Passive  Right eval Left eval  Hip flexion [facility]  Hip extension    Hip abduction    Hip adduction    Hip internal rotation    Hip external rotation    Knee flexion [facility] WFL  Knee extension [facility]   Ankle dorsiflexion    Ankle plantarflexion    Ankle inversion    Ankle eversion     (Blank rows = not tested)  LOWER EXTREMITY MMT:    MMT Right eval Left eval  Hip flexion 4+ 4+  Hip extension    Hip abduction 4 4  Hip adduction    Hip internal rotation    Hip external rotation    Knee flexion 5 5  Knee extension 5 5  Ankle dorsiflexion 5 5  Ankle plantarflexion    Ankle inversion    Ankle eversion     (Blank rows = not tested)  LUMBAR SPECIAL TESTS:  Straight leg raise test: Negative  FUNCTIONAL TESTS:  5 times sit to stand: 19 sec  2 minute walk test: 430 feet   GAIT: Distance walked: 430 Assistive device utilized: None Level  of assistance: Modified independence Comments: good pace, arm swing, no limp but does report increased Rt knee pain   TREATMENT DATE: 04/26/23                                                                                                                              Therapeutic Activity: 2 min walk Bed mobility to rescue  back strain    PATIENT EDUCATION:  Education details: Pt, POC, body mechanics  Person educated: Patient Education method: Explanation and Verbal cues Education comprehension: verbalized understanding  HOME EXERCISE PROGRAM: NA on eval  Will do 1st treatment   ASSESSMENT:  CLINICAL IMPRESSION: Patient is a 78 y.o. female who was seen today for physical therapy evaluation and treatment for low back pai with intermittent radicular symptoms into her LLE .   OBJECTIVE IMPAIRMENTS: decreased mobility, difficulty walking, decreased ROM, decreased strength, increased fascial restrictions, increased muscle spasms, impaired flexibility, postural dysfunction, and pain.   ACTIVITY LIMITATIONS: bending, standing, squatting, transfers, locomotion level, and recreation  PARTICIPATION LIMITATIONS: shopping and community activity  PERSONAL FACTORS: Time since onset of injury/illness/exacerbation are also affecting patient's functional outcome.   REHAB POTENTIAL: Excellent  CLINICAL DECISION MAKING: Stable/uncomplicated  EVALUATION COMPLEXITY: Low   GOALS: Goals reviewed with patient? Yes  LONG TERM GOALS: Target date: 06/07/2023    Patient will be independent with home exercise program upon discharge Baseline:  Goal status: INITIAL  2.  Patient will be able to complete sit to stand in less than 13 seconds x 5 Baseline:  Goal status: INITIAL  3.  Patient will be able to demonstrate proper lifting and biomechanics for home tasks to avoid exacerbation of pain Baseline:  Goal status: INITIAL  4.  Patient will report LE pain as resolved > 75% of the time, episodes of pain < 3/10 pain .  Baseline:  Goal status: INITIAL  5.  Patient will be able to return to group exercises classes without increased pain.  Baseline:  Goal status: INITIAL  6.  FOTO score will improve to 65% or better  Baseline: 61% Goal status: INITIAL  PLAN:  PT FREQUENCY: 1x/week  PT DURATION: 8  weeks  PLANNED INTERVENTIONS: 97164- PT Re-evaluation, 97110-Therapeutic exercises, 97530- Therapeutic activity, [id number]- Neuromuscular re-education, 97535- Self Care, [id number]- Manual therapy, Patient/Family education, Balance training, Dry Needling, and Spinal mobilization.  PLAN FOR NEXT SESSION: develop HEP, Nustep, core stability    Crosley Stejskal, PT 04/26/2023, 6:41 PM   Karie Mainland, PT 04/26/23 6:54 PM Phone: [phone] Fax: [phone]

## 2023-04-28 ENCOUNTER — Other Ambulatory Visit: Payer: Self-pay | Admitting: Family Medicine

## 2023-04-28 DIAGNOSIS — K219 Gastro-esophageal reflux disease without esophagitis: Secondary | ICD-10-CM

## 2023-05-02 NOTE — Therapy (Signed)
OUTPATIENT PHYSICAL THERAPY THORACOLUMBAR EVALUATION   Patient Name: Nina Olson MRN: [id number] DOB:Mar 22, 1946, 78 y.o., female Today's Date: 05/03/2023  END OF SESSION:  PT End of Session - 05/03/23 0852     Visit Number 2    Number of Visits 8    Date for PT Re-Evaluation 06/21/23    Authorization Type [employer]    PT Start Time 0850    PT Stop Time 0930    PT Time Calculation (min) 40 min    Activity Tolerance Patient tolerated treatment well              Past Medical History:  Diagnosis Date   Anemia    Arthritis    Colon polyps    CVA (cerebral vascular accident) (HCC)    CVA (cerebrovascular accident) (HCC)    L MCA   GERD (gastroesophageal reflux disease)    High cholesterol    Hypertension    Osteopenia    Palpitations    Past Surgical History:  Procedure Laterality Date   CESAREAN SECTION     Patient Active Problem List   Diagnosis Date Noted   Abdominal bloating 07/15/2020   Abnormal feces 07/15/2020   Colon cancer screening 07/15/2020   Family history of malignant neoplasm of gastrointestinal tract 07/15/2020   Generalized abdominal pain 07/15/2020   History of colonic polyps 07/15/2020   Rectal bleeding 07/15/2020   HSV (herpes simplex virus) anogenital infection 07/15/2020   Rectal itching 07/15/2020   Bladder prolapse, female, acquired 04/28/2019   Controlled type 2 diabetes mellitus with neuropathy (HCC) 10/27/2018   OA (osteoarthritis) of hip 05/24/2017   DDD (degenerative disc disease), lumbar 05/24/2017   GERD (gastroesophageal reflux disease) 02/10/2016   Essential hypertension 07/26/2015   Hyperlipidemia 07/26/2015   Insomnia secondary to depression with anxiety 07/26/2015   Osteopenia    Palpitations    Colon polyps     PCP: Lynnea Ferrier MD   REFERRING PROVIDER: Lynnea Ferrier MD   REFERRING DIAG:  Diagnosis  M54.32 (ICD-10-CM) - Left sided sciatica    Rationale for Evaluation and Treatment:  Rehabilitation  THERAPY DIAG:  Radiculopathy, lumbar region  ONSET DATE: approx. 5-6 yrs   SUBJECTIVE:                                                                                                                                                                                           SUBJECTIVE STATEMENT: Patient reports she did not sleep very well last night.  Yesterday the right hip and leg were bothering her.  Her pain is minimal to none.  Patient as had this issue for  5-6 years.  It used to ease off after a few days. The pain is from her lower back to her hip/thigh, stops at the knee.  Sometimes the Rt hip feels that way too.  Her symptoms limit her from /  She had it a couple of weeks ago, had to get a wheelchair to et on the plane.  At that time she could barely walking. She has some soreness now. She has periods of pain that last about 3 days . Her leg does feel a bit weak.  Cramping. She does have LE numbness due to neuropathy but only in her feet.  Like I'm walking on cardboard. 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   PERTINENT HISTORY:  Diagnosis  M54.32 (ICD-10-CM) - Left sided sciatica   Vertigo  Donita Brooks, MD  PAIN:  Are you having pain? Yes: NPRS scale: 0-1/10 Pain location: Lt low back  Pain description: sore today but can be severe and spasm when episodes occur Aggravating factors: turning wrong, lifting, bending  Relieving factors: Pain muscle rub, laying down  Pain can be 9/10 excruciating.    PRECAUTIONS: None  RED FLAGS: None   WEIGHT BEARING RESTRICTIONS: No  FALLS:  Has patient fallen in last 6 months? No  LIVING ENVIRONMENT: Lives with: lives with their spouse Lives in: House/apartment Stairs: No Has following equipment at home: None  OCCUPATION: Retired, Psychologist, occupational  PLOF: Independent gardening, travel  PATIENT GOALS: I want to be able to get to the point where I wont be in pain   NEXT MD VISIT: as needed  OBJECTIVE:  Note:  Objective measures were completed at Evaluation unless otherwise noted.  DIAGNOSTIC FINDINGS:  XR 04/20/23:  IMPRESSION: Multilevel degenerative changes of the lumbar spine greatest involving the facets at L5-S1.  PATIENT SURVEYS:  FOTO 61%  goal is 65%  COGNITION: Overall cognitive status: Within functional limits for tasks assessed     SENSATION: Pt reports L > R numbness in foot   MUSCLE LENGTH: Hamstrings: WFL Thomas test: Tight quads and hip flexors   POSTURE: increased thoracic kyphosis  PALPATION: Soreness to palpation along Lt L5-S1 paraspinals and centrally   LUMBAR ROM:   AROM eval  Flexion WFL, no pain   Extension 50%   Right lateral flexion WFL  Left lateral flexion WFL  Right rotation 25%  Left rotation 25%   (Blank rows = not tested)  LOWER EXTREMITY ROM:     Passive  Right eval Left eval  Hip flexion [facility]  Hip extension    Hip abduction    Hip adduction    Hip internal rotation    Hip external rotation    Knee flexion [facility] WFL  Knee extension [facility]   Ankle dorsiflexion    Ankle plantarflexion    Ankle inversion    Ankle eversion     (Blank rows = not tested)  LOWER EXTREMITY MMT:    MMT Right eval Left eval  Hip flexion 4+ 4+  Hip extension    Hip abduction 4 4  Hip adduction    Hip internal rotation    Hip external rotation    Knee flexion 5 5  Knee extension 5 5  Ankle dorsiflexion 5 5  Ankle plantarflexion    Ankle inversion    Ankle eversion     (Blank rows = not tested)  LUMBAR SPECIAL TESTS:  Straight leg raise test: Negative  FUNCTIONAL TESTS:  5 times sit to stand: 19  sec  2 minute walk test: 430 feet   GAIT: Distance walked: 430 Assistive device utilized: None Level of assistance: Modified independence Comments: good pace, arm swing, no limp but does report increased Rt knee pain   TREATMENT DATE:   [facility] Adult PT Treatment:                                                DATE: 05/03/23 Therapeutic  Exercise: NuStep L4 UE and LE for 5 min  Hamstring stretch/ITB 30 sec x 3  Knee to chest x 3 , 10 sec  Lower trunk rotation x 10 x 10 sec  Bridge x 15  Hip abduction x 15  Sit to stand x 10 Sit to stand with 10 lbs x 10  Seated ball rolls x 10  Standing hip abduction   PT Evaluation: 04/26/23                                                                                                                              Therapeutic Activity: 2 min walk Bed mobility to reduce  back strain    PATIENT EDUCATION:  Education details: Pt, POC, body mechanics  Person educated: Patient Education method: Explanation and Verbal cues Education comprehension: verbalized understanding  HOME EXERCISE PROGRAM: NA on eval  Will do 1st treatment   ASSESSMENT:  CLINICAL IMPRESSION: Patient had no limitation of pain today, reports a feeling of weakness/fatigue in her low back with standing activities.  She was given a basic HEP for mobility of her trunk and hips.  Continue POC, to include standing as tolerated.   OBJECTIVE IMPAIRMENTS: decreased mobility, difficulty walking, decreased ROM, decreased strength, increased fascial restrictions, increased muscle spasms, impaired flexibility, postural dysfunction, and pain.   ACTIVITY LIMITATIONS: bending, standing, squatting, transfers, locomotion level, and recreation  PARTICIPATION LIMITATIONS: shopping and community activity  PERSONAL FACTORS: Time since onset of injury/illness/exacerbation are also affecting patient's functional outcome.   REHAB POTENTIAL: Excellent  CLINICAL DECISION MAKING: Stable/uncomplicated  EVALUATION COMPLEXITY: Low   GOALS: Goals reviewed with patient? Yes  LONG TERM GOALS: Target date: 06/07/2023    Patient will be independent with home exercise program upon discharge Baseline:  Goal status: INITIAL  2.  Patient will be able to complete sit to stand in less than 13 seconds x 5 Baseline:  Goal status:  INITIAL  3.  Patient will be able to demonstrate proper lifting and biomechanics for home tasks to avoid exacerbation of pain Baseline:  Goal status: INITIAL  4.  Patient will report LE pain as resolved > 75% of the time, episodes of pain < 3/10 pain .  Baseline:  Goal status: INITIAL  5.  Patient will be able to return to group exercises classes without increased pain.  Baseline:  Goal status: INITIAL  6.  FOTO score will improve to 65% or better  Baseline: 61% Goal status: INITIAL  PLAN:  PT FREQUENCY: 1x/week  PT DURATION: 8 weeks  PLANNED INTERVENTIONS: 97164- PT Re-evaluation, 97110-Therapeutic exercises, 97530- Therapeutic activity, [id number]- Neuromuscular re-education, 97535- Self Care, 60454- Manual therapy, Patient/Family education, Balance training, Dry Needling, and Spinal mobilization.  PLAN FOR NEXT SESSION: develop HEP, Nustep, core stability    Tivon Lemoine, PT 05/03/2023, 11:59 AM   Karie Mainland, PT 05/03/23 11:59 AM Phone: [phone] Fax: [phone]

## 2023-05-03 ENCOUNTER — Ambulatory Visit: Payer: PPO | Admitting: Physical Therapy

## 2023-05-03 ENCOUNTER — Encounter: Payer: Self-pay | Admitting: Physical Therapy

## 2023-05-03 DIAGNOSIS — M5416 Radiculopathy, lumbar region: Secondary | ICD-10-CM | POA: Diagnosis not present

## 2023-05-06 ENCOUNTER — Encounter: Payer: HMO | Admitting: Physical Therapy

## 2023-05-08 ENCOUNTER — Encounter: Payer: HMO | Admitting: Physical Therapy

## 2023-05-12 ENCOUNTER — Other Ambulatory Visit: Payer: Self-pay | Admitting: Family Medicine

## 2023-05-12 DIAGNOSIS — I10 Essential (primary) hypertension: Secondary | ICD-10-CM

## 2023-05-13 ENCOUNTER — Encounter: Payer: HMO | Admitting: Physical Therapy

## 2023-05-13 NOTE — Telephone Encounter (Signed)
Requested medication (s) are due for refill today: yes   Requested medication (s) are on the active medication list: yes   Last refill:  02/13/23 #90 0 refills  Future visit scheduled: no   Notes to clinic:   no refills remain. Do you want to continue refills?     Requested Prescriptions  Pending Prescriptions Disp Refills   losartan (COZAAR) 100 MG tablet [Pharmacy Med Name: Losartan Potassium 100 MG Oral Tablet] 90 tablet 0    Sig: Take 1 tablet by mouth once daily     Cardiovascular:  Angiotensin Receptor Blockers Failed - 05/13/2023  4:11 PM      Failed - Valid encounter within last 6 months    Recent Outpatient Visits           1 year ago Gastroesophageal reflux disease without esophagitis   [facility] Donita Brooks, MD   2 years ago Insomnia secondary to depression with anxiety   [facility] Valentino Nose, NP   2 years ago Encounter for annual wellness visit (AWV) in Medicare patient   [facility] Valentino Nose, NP   2 years ago HSV (herpes simplex virus) anogenital infection   [facility] Valentino Nose, NP   2 years ago Controlled type 2 diabetes mellitus with neuropathy (HCC)   [facility] Valentino Nose, NP              Passed - Cr in normal range and within 180 days    Creat  Date Value Ref Range Status  04/16/2023 0.94 0.60 - 1.00 mg/dL Final   Creatinine, Urine  Date Value Ref Range Status  04/16/2023 182 20 - 275 mg/dL Final  30/86/5784 696 20 - 275 mg/dL Final         Passed - K in normal range and within 180 days    Potassium  Date Value Ref Range Status  04/16/2023 4.1 3.5 - 5.3 mmol/L Final         Passed - Patient is not pregnant      Passed - Last BP in normal range    BP Readings from Last 1 Encounters:  04/16/23 138/80

## 2023-05-15 ENCOUNTER — Encounter: Payer: HMO | Admitting: Physical Therapy

## 2023-05-17 ENCOUNTER — Ambulatory Visit: Payer: PPO | Attending: Family Medicine | Admitting: Physical Therapy

## 2023-05-17 ENCOUNTER — Encounter: Payer: Self-pay | Admitting: Physical Therapy

## 2023-05-17 DIAGNOSIS — M5416 Radiculopathy, lumbar region: Secondary | ICD-10-CM | POA: Insufficient documentation

## 2023-05-17 DIAGNOSIS — R262 Difficulty in walking, not elsewhere classified: Secondary | ICD-10-CM | POA: Diagnosis not present

## 2023-05-17 DIAGNOSIS — R42 Dizziness and giddiness: Secondary | ICD-10-CM | POA: Diagnosis not present

## 2023-05-17 NOTE — Therapy (Signed)
 OUTPATIENT PHYSICAL THERAPY NOTE   Patient Name: Nina Olson MRN: [id number] DOB:[birth year], 78 y.o., female Today's Date: 05/17/2023  END OF SESSION:  PT End of Session - 05/17/23 0939     Visit Number 3    Number of Visits 8    Date for PT Re-Evaluation 06/21/23    Authorization Type [employer]    PT Start Time 0935    PT Stop Time 1018    PT Time Calculation (min) 43 min    Activity Tolerance Patient tolerated treatment well    Behavior During Therapy [facility] for tasks assessed/performed               Past Medical History:  Diagnosis Date   Anemia    Arthritis    Colon polyps    CVA (cerebral vascular accident) (HCC)    CVA (cerebrovascular accident) (HCC)    L MCA   GERD (gastroesophageal reflux disease)    High cholesterol    Hypertension    Osteopenia    Palpitations    Past Surgical History:  Procedure Laterality Date   CESAREAN SECTION     Patient Active Problem List   Diagnosis Date Noted   Abdominal bloating 07/15/2020   Abnormal feces 07/15/2020   Colon cancer screening 07/15/2020   Family history of malignant neoplasm of gastrointestinal tract 07/15/2020   Generalized abdominal pain 07/15/2020   History of colonic polyps 07/15/2020   Rectal bleeding 07/15/2020   HSV (herpes simplex virus) anogenital infection 07/15/2020   Rectal itching 07/15/2020   Bladder prolapse, female, acquired 04/28/2019   Controlled type 2 diabetes mellitus with neuropathy (HCC) 10/27/2018   OA (osteoarthritis) of hip 05/24/2017   DDD (degenerative disc disease), lumbar 05/24/2017   GERD (gastroesophageal reflux disease) 02/10/2016   Essential hypertension 07/26/2015   Hyperlipidemia 07/26/2015   Insomnia secondary to depression with anxiety 07/26/2015   Osteopenia    Palpitations    Colon polyps     PCP: Duanne Lowers MD   REFERRING PROVIDER: Duanne Lowers MD   REFERRING DIAG:  Diagnosis  M54.32 (ICD-10-CM) - Left sided sciatica    Rationale  for Evaluation and Treatment: Rehabilitation  THERAPY DIAG:  Radiculopathy, lumbar region  ONSET DATE: approx. 5-6 yrs   SUBJECTIVE:                                                                                                                                                                                           SUBJECTIVE STATEMENT: Pt has no pain right now.  Yesterday had some Rt sided leg pain . Missed last week, had  to cancel.  Patient as had this issue for 5-6 years.  It used to ease off after a few days. The pain is from her lower back to her hip/thigh, stops at the knee.  Sometimes the Rt hip feels that way too.  Her symptoms limit her from /  She had it a couple of weeks ago, had to get a wheelchair to et on the plane.  At that time she could barely walking. She has some soreness now. She has periods of pain that last about 3 days . Her leg does feel a bit weak.  Cramping. She does have LE numbness due to neuropathy but only in her feet.  Like I'm walking on cardboard. 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   PERTINENT HISTORY:  Diagnosis  M54.32 (ICD-10-CM) - Left sided sciatica   Vertigo  Duanne Butler DASEN, MD  PAIN:  Are you having pain? Yes: NPRS scale: 0-1/10 Pain location: Lt low back  Pain description: sore today but can be severe and spasm when episodes occur Aggravating factors: turning wrong, lifting, bending  Relieving factors: Pain muscle rub, laying down  Pain can be 9/10 excruciating.    PRECAUTIONS: None  RED FLAGS: None   WEIGHT BEARING RESTRICTIONS: No  FALLS:  Has patient fallen in last 6 months? No  LIVING ENVIRONMENT: Lives with: lives with their spouse Lives in: House/apartment Stairs: No Has following equipment at home: None  OCCUPATION: Retired, psychologist, occupational  PLOF: Independent gardening, travel  PATIENT GOALS: I want to be able to get to the point where I wont be in pain   NEXT MD VISIT: as needed  OBJECTIVE:  Note: Objective  measures were completed at Evaluation unless otherwise noted.  DIAGNOSTIC FINDINGS:  XR 04/20/23:  IMPRESSION: Multilevel degenerative changes of the lumbar spine greatest involving the facets at L5-S1.  PATIENT SURVEYS:  FOTO 61%  goal is 65%  COGNITION: Overall cognitive status: Within functional limits for tasks assessed     SENSATION: Pt reports L > R numbness in foot   MUSCLE LENGTH: Hamstrings: WFL Thomas test: Tight quads and hip flexors   POSTURE: increased thoracic kyphosis  PALPATION: Soreness to palpation along Lt L5-S1 paraspinals and centrally   LUMBAR ROM:   AROM eval  Flexion WFL, no pain   Extension 50%   Right lateral flexion WFL  Left lateral flexion WFL  Right rotation 25%  Left rotation 25%   (Blank rows = not tested)  LOWER EXTREMITY ROM:     Passive  Right eval Left eval  Hip flexion [facility]  Hip extension    Hip abduction    Hip adduction    Hip internal rotation    Hip external rotation    Knee flexion [facility] WFL  Knee extension [facility]   Ankle dorsiflexion    Ankle plantarflexion    Ankle inversion    Ankle eversion     (Blank rows = not tested)  LOWER EXTREMITY MMT:    MMT Right eval Left eval  Hip flexion 4+ 4+  Hip extension    Hip abduction 4 4  Hip adduction    Hip internal rotation    Hip external rotation    Knee flexion 5 5  Knee extension 5 5  Ankle dorsiflexion 5 5  Ankle plantarflexion    Ankle inversion    Ankle eversion     (Blank rows = not tested)  LUMBAR SPECIAL TESTS:  Straight leg raise test: Negative  FUNCTIONAL TESTS:  5 times sit to stand: 19 sec  2 minute walk test: 430 feet   GAIT: Distance walked: 430 Assistive device utilized: None Level of assistance: Modified independence Comments: good pace, arm swing, no limp but does report increased Rt knee pain   TREATMENT DATE:  [facility] Adult PT Treatment:                                                DATE: 05/17/23 Therapeutic  Activity: Hamstring stretch/ITB 30 sec x 3  Knee to chest x 3 , 10 sec  Lower trunk rotation x 10 x 10 sec  Bridge x 15  Standing hip abduction and extension at countertop with step Step ups 5 lbs 0-1 UE support for balance  Added on 5 lbs x 10  Squat at countertop  Standing row  x 10  Standing shoulder ext x 10  added high knee march x 10  Sit to stand x 10  Sit to stand with 10 lbs KB march 1 min     [facility] Adult PT Treatment:                                                DATE: 05/03/23 Therapeutic Exercise: NuStep L4 UE and LE for 5 min  Hamstring stretch/ITB 30 sec x 3  Knee to chest x 3 , 10 sec  Lower trunk rotation x 10 x 10 sec  Bridge x 15  Hip abduction x 15  Sit to stand x 10 Sit to stand with 10 lbs x 10  Seated ball rolls x 10  Standing hip abduction   PT Evaluation: 04/26/23                                                                                                                              Therapeutic Activity: 2 min walk Bed mobility to reduce  back strain    PATIENT EDUCATION:  Education details: Pt, POC, body mechanics  Person educated: Patient Education method: Explanation and Verbal cues Education comprehension: verbalized understanding  HOME EXERCISE PROGRAM: Access Code: [email or web address] URL: [email or web address] Date: 05/17/2023 Prepared by: Delon Norma  Exercises - Supine Hamstring Stretch with Strap  - 1 x daily - 7 x weekly - 1 sets - 3 reps - 30 hold - Supine ITB Stretch with Strap  - 1 x daily - 7 x weekly - 1 sets - 3 reps - 30 hold - Supine Single Knee to Chest Stretch  - 1 x daily - 7 x weekly - 1 sets - 5 reps - 30 hold - Supine Bridge  - 1 x daily - 7 x weekly - 2 sets - 10 reps -  5 hold - Supine Lower Trunk Rotation  - 1 x daily - 7 x weekly - 2 sets - 10 reps - 10 hold - Standing Hip Abduction with Unilateral Counter Support  - 1 x daily - 7 x weekly - 2 sets - 10 reps - 5 hold   ASSESSMENT:  CLINICAL  IMPRESSION: Patient used to do yoga and pilates group classes at the senior center (last yr) she identifies this as a goal for her. Pt able to tolerate progressive standing core exercises without increasing pain.  She added strength to her home program to address core and trunk weakness, tolerance for standing.    OBJECTIVE IMPAIRMENTS: decreased mobility, difficulty walking, decreased ROM, decreased strength, increased fascial restrictions, increased muscle spasms, impaired flexibility, postural dysfunction, and pain.   ACTIVITY LIMITATIONS: bending, standing, squatting, transfers, locomotion level, and recreation  PARTICIPATION LIMITATIONS: shopping and community activity  PERSONAL FACTORS: Time since onset of injury/illness/exacerbation are also affecting patient's functional outcome.   REHAB POTENTIAL: Excellent  CLINICAL DECISION MAKING: Stable/uncomplicated  EVALUATION COMPLEXITY: Low   GOALS: Goals reviewed with patient? Yes  LONG TERM GOALS: Target date: 06/07/2023    Patient will be independent with home exercise program upon discharge Baseline:  Goal status: ongoing   2.  Patient will be able to complete sit to stand in less than 13 seconds x 5 Baseline:  Goal status: ongoing   3.  Patient will be able to demonstrate proper lifting and biomechanics for home tasks to avoid exacerbation of pain Baseline:  Goal status: ongoing   4.  Patient will report LE pain as resolved > 75% of the time, episodes of pain < 3/10 pain .  Baseline:  Goal status: ongoing   5.  Patient will be able to return to group exercises classes without increased pain.  Baseline:  Goal status: ongoing   6.  FOTO score will improve to 65% or better  Baseline: 61% Goal status: ongoing   PLAN:  PT FREQUENCY: 1x/week  PT DURATION: 8 weeks  PLANNED INTERVENTIONS: 97164- PT Re-evaluation, 97110-Therapeutic exercises, 97530- Therapeutic activity, [id number]- Neuromuscular re-education, 97535- Self  Care, [id number]- Manual therapy, Patient/Family education, Balance training, Dry Needling, and Spinal mobilization.  PLAN FOR NEXT SESSION: develop HEP, Nustep, core stability    Loman Logan, PT 05/17/2023, 11:29 AM   Delon Norma, PT 05/17/23 11:29 AM Phone: [phone] Fax: [phone]

## 2023-05-20 ENCOUNTER — Other Ambulatory Visit: Payer: Self-pay | Admitting: Family Medicine

## 2023-05-20 ENCOUNTER — Other Ambulatory Visit: Payer: Self-pay

## 2023-05-20 ENCOUNTER — Telehealth: Payer: Self-pay

## 2023-05-20 ENCOUNTER — Encounter: Payer: HMO | Admitting: Physical Therapy

## 2023-05-20 DIAGNOSIS — Z1231 Encounter for screening mammogram for malignant neoplasm of breast: Secondary | ICD-10-CM

## 2023-05-20 DIAGNOSIS — I10 Essential (primary) hypertension: Secondary | ICD-10-CM

## 2023-05-20 MED ORDER — LOSARTAN POTASSIUM 100 MG PO TABS: 100.0000 mg | ORAL_TABLET | Freq: Every day | ORAL | 1 refills | Status: DC

## 2023-05-20 NOTE — Telephone Encounter (Signed)
 Prescription Request  05/20/2023  LOV: 04/23/23  What is the name of the medication or equipment? losartan  (COZAAR ) 100 MG tablet [[id number]]   Have you contacted your pharmacy to request a refill? Yes   Which pharmacy would you like this sent to?  [id number] - [place] ([place] - [address] [place] ([place])  [address] Phone: [phone] Fax: [phone]    Patient notified that their request is being sent to the clinical staff for review and that they should receive a response within 2 business days.   Please advise at [facility] [phone]

## 2023-05-22 ENCOUNTER — Encounter: Payer: HMO | Admitting: Physical Therapy

## 2023-05-23 ENCOUNTER — Telehealth: Payer: Self-pay | Admitting: Family Medicine

## 2023-05-23 ENCOUNTER — Ambulatory Visit
Admission: RE | Admit: 2023-05-23 | Discharge: 2023-05-23 | Discharge status: Home / Self Care | Payer: PPO | Source: Ambulatory Visit | Attending: Family Medicine | Admitting: Family Medicine

## 2023-05-23 DIAGNOSIS — N958 Other specified menopausal and perimenopausal disorders: Secondary | ICD-10-CM | POA: Diagnosis not present

## 2023-05-23 DIAGNOSIS — M8588 Other specified disorders of bone density and structure, other site: Secondary | ICD-10-CM | POA: Diagnosis not present

## 2023-05-23 DIAGNOSIS — E2839 Other primary ovarian failure: Secondary | ICD-10-CM | POA: Diagnosis not present

## 2023-05-24 ENCOUNTER — Encounter: Payer: Self-pay | Admitting: Physical Therapy

## 2023-05-24 ENCOUNTER — Other Ambulatory Visit: Payer: Self-pay | Admitting: Family Medicine

## 2023-05-24 ENCOUNTER — Ambulatory Visit: Payer: PPO | Admitting: Physical Therapy

## 2023-05-24 ENCOUNTER — Other Ambulatory Visit: Payer: Self-pay

## 2023-05-24 DIAGNOSIS — M5416 Radiculopathy, lumbar region: Secondary | ICD-10-CM | POA: Diagnosis not present

## 2023-05-24 DIAGNOSIS — R42 Dizziness and giddiness: Secondary | ICD-10-CM

## 2023-05-24 DIAGNOSIS — E7800 Pure hypercholesterolemia, unspecified: Secondary | ICD-10-CM

## 2023-05-24 DIAGNOSIS — E1140 Type 2 diabetes mellitus with diabetic neuropathy, unspecified: Secondary | ICD-10-CM

## 2023-05-24 DIAGNOSIS — R262 Difficulty in walking, not elsewhere classified: Secondary | ICD-10-CM

## 2023-05-24 MED ORDER — SEMAGLUTIDE(0.25 OR 0.5MG/DOS) 2 MG/3ML ~~LOC~~ SOPN: 0.5000 mg | PEN_INJECTOR | SUBCUTANEOUS | 0 refills | Status: DC

## 2023-05-24 NOTE — Telephone Encounter (Signed)
Copied from CRM [id number]. Topic: Clinical - Medication Refill >> May 24, 2023  2:16 PM Antony Haste wrote: Most Recent Primary Care Visit:  Provider: Laurey Arrow  Department: [facility]  Visit Type: MEDICARE AWV, SEQUENTIAL  Date: 04/25/2023  Medication: Semaglutide,0.25 or 0.5MG /DOS, 2 MG/3ML SOPN  Has the patient contacted their pharmacy? Yes (Agent: If no, request that the patient contact the pharmacy for the refill. If patient does not wish to contact the pharmacy document the reason why and proceed with request.) (Agent: If yes, when and what did the pharmacy advise?)  Is this the correct pharmacy for this prescription? Yes If no, delete pharmacy and type the correct one.  This is the patient's preferred pharmacy:  [id number] - [place] ([place]), [place] - [address]  ([place] [address] Phone: [phone] Fax: [phone]   Has the prescription been filled recently? No  Is the patient out of the medication? Yes  Has the patient been seen for an appointment in the last year OR does the patient have an upcoming appointment? Yes  Can we respond through MyChart? No, callback preferred.  Agent: Please be advised that Rx refills may take up to 3 business days. We ask that you follow-up with your pharmacy.

## 2023-05-24 NOTE — Therapy (Signed)
OUTPATIENT PHYSICAL THERAPY NOTE   Patient Name: Nina Olson MRN: [id number] DOB:February 13, 1946, 78 y.o., female Today's Date: 05/24/2023  END OF SESSION:  PT End of Session - 05/24/23 0940     Visit Number 4    Number of Visits 8    Date for PT Re-Evaluation 06/21/23    Authorization Type [employer]    PT Start Time 0935    PT Stop Time 1015    PT Time Calculation (min) 40 min    Activity Tolerance Patient tolerated treatment well    Behavior During Therapy [facility] for tasks assessed/performed                Past Medical History:  Diagnosis Date   Anemia    Arthritis    Colon polyps    CVA (cerebral vascular accident) (HCC)    CVA (cerebrovascular accident) (HCC)    L MCA   GERD (gastroesophageal reflux disease)    High cholesterol    Hypertension    Osteopenia    Palpitations    Past Surgical History:  Procedure Laterality Date   CESAREAN SECTION     Patient Active Problem List   Diagnosis Date Noted   Abdominal bloating 07/15/2020   Abnormal feces 07/15/2020   Colon cancer screening 07/15/2020   Family history of malignant neoplasm of gastrointestinal tract 07/15/2020   Generalized abdominal pain 07/15/2020   History of colonic polyps 07/15/2020   Rectal bleeding 07/15/2020   HSV (herpes simplex virus) anogenital infection 07/15/2020   Rectal itching 07/15/2020   Bladder prolapse, female, acquired 04/28/2019   Controlled type 2 diabetes mellitus with neuropathy (HCC) 10/27/2018   OA (osteoarthritis) of hip 05/24/2017   DDD (degenerative disc disease), lumbar 05/24/2017   GERD (gastroesophageal reflux disease) 02/10/2016   Essential hypertension 07/26/2015   Hyperlipidemia 07/26/2015   Insomnia secondary to depression with anxiety 07/26/2015   Osteopenia    Palpitations    Colon polyps     PCP: Lynnea Ferrier MD   REFERRING PROVIDER: Lynnea Ferrier MD   REFERRING DIAG:  Diagnosis  M54.32 (ICD-10-CM) - Left sided sciatica     Rationale for Evaluation and Treatment: Rehabilitation  THERAPY DIAG:  Radiculopathy, lumbar region  Dizziness and giddiness  Difficulty in walking, not elsewhere classified  ONSET DATE: approx. 5-6 yrs   SUBJECTIVE:                                                                                                                                                                                           SUBJECTIVE STATEMENT: Pt woke up with pain in Rt hip and back of the  calf, 5/10.  Was throbbing this AM.  Cannot think of a reason why.   Patient as had this issue for 5-6 years.  It used to ease off after a few days. The pain is from her lower back to her hip/thigh, stops at the knee.  Sometimes the Rt hip feels that way too.  Her symptoms limit her from /  She had it a couple of weeks ago, had to get a wheelchair to et on the plane.  At that time she could barely walking. She has some soreness now. She has periods of pain that last about 3 days . Her leg does feel a bit weak.  Cramping. She does have LE numbness due to neuropathy but only in her feet.  Like I'm walking on cardboard. 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   PERTINENT HISTORY:  Diagnosis  M54.32 (ICD-10-CM) - Left sided sciatica   Vertigo  Donita Brooks, MD  PAIN:  Are you having pain? Yes: NPRS scale: 5/10 Pain location: Lt low back  Pain description: sore today but can be severe and spasm when episodes occur Aggravating factors: turning wrong, lifting, bending  Relieving factors: Pain muscle rub, laying down  Pain can be 9/10 excruciating.    PRECAUTIONS: None  RED FLAGS: None   WEIGHT BEARING RESTRICTIONS: No  FALLS:  Has patient fallen in last 6 months? No  LIVING ENVIRONMENT: Lives with: lives with their spouse Lives in: House/apartment Stairs: No Has following equipment at home: None  OCCUPATION: Retired, Psychologist, occupational  PLOF: Independent gardening, travel  PATIENT GOALS: I want to be able  to get to the point where I wont be in pain   NEXT MD VISIT: as needed  OBJECTIVE:  Note: Objective measures were completed at Evaluation unless otherwise noted.  DIAGNOSTIC FINDINGS:  XR 04/20/23:   Grade 1 anterolisthesis of L4 on L5. Alignment otherwise within normal limits. No significant vertebral body height loss. Moderate to severe facet degenerative changes seen at L5-S1.   IMPRESSION: Multilevel degenerative changes of the lumbar spine greatest involving the facets at L5-S1.    PATIENT SURVEYS:  FOTO 61%  goal is 65%  COGNITION: Overall cognitive status: Within functional limits for tasks assessed     SENSATION: Pt reports L > R numbness in foot   MUSCLE LENGTH: Hamstrings: WFL Thomas test: Tight quads and hip flexors   POSTURE: increased thoracic kyphosis  PALPATION: Soreness to palpation along Lt L5-S1 paraspinals and centrally   LUMBAR ROM:   AROM eval  Flexion WFL, no pain   Extension 50%   Right lateral flexion WFL  Left lateral flexion WFL  Right rotation 25%  Left rotation 25%   (Blank rows = not tested)  LOWER EXTREMITY ROM:     Passive  Right eval Left eval  Hip flexion [facility]  Hip extension    Hip abduction    Hip adduction    Hip internal rotation    Hip external rotation    Knee flexion [facility] WFL  Knee extension [facility]   Ankle dorsiflexion    Ankle plantarflexion    Ankle inversion    Ankle eversion     (Blank rows = not tested)  LOWER EXTREMITY MMT:    MMT Right eval Left eval  Hip flexion 4+ 4+  Hip extension    Hip abduction 4 4  Hip adduction    Hip internal rotation    Hip external rotation    Knee flexion  5 5  Knee extension 5 5  Ankle dorsiflexion 5 5  Ankle plantarflexion    Ankle inversion    Ankle eversion     (Blank rows = not tested)  LUMBAR SPECIAL TESTS:  Straight leg raise test: Negative  FUNCTIONAL TESTS:  5 times sit to stand: 19 sec  2 minute walk test: 430 feet   GAIT: Distance  walked: 430 Assistive device utilized: None Level of assistance: Modified independence Comments: good pace, arm swing, no limp but does report increased Rt knee pain   TREATMENT DATE:   [facility] Adult PT Treatment:                                                DATE: 05/24/23 Therapeutic Activity: NuStep LE and UE for 7 min  Hip stretching: Rt over Lt. Knee press, pull , knees crossed added rotation  Hamstring and ITB with strap Rt LE x 3  Bridging with GTB x 15  Supine bent knee fall out GTB x 10 each LE  Bridge hover with clam GTB x 10  Sidelying clam and hip abd with GTB x 10 x 2  Seated ball roll outs with cues to flex lower spine forward and lateral  Seated piriformis stretch figure 4 each side x 3 , 30 sec    [facility] Adult PT Treatment:                                                DATE: 05/17/23 Therapeutic Activity: Hamstring stretch/ITB 30 sec x 3  Knee to chest x 3 , 10 sec  Lower trunk rotation x 10 x 10 sec  Bridge x 15  Standing hip abduction and extension at countertop with step Step ups 5 lbs 0-1 UE support for balance  Added on 5 lbs x 10  Squat at countertop  Standing row x 10  Standing shoulder ext x 10 added high knee march x 10  Sit to stand x 10  Sit to stand with 10 lbs KB march 1 min     [facility] Adult PT Treatment:                                                DATE: 05/03/23 Therapeutic Exercise: NuStep L4 UE and LE for 5 min  Hamstring stretch/ITB 30 sec x 3  Knee to chest x 3 , 10 sec  Lower trunk rotation x 10 x 10 sec  Bridge x 15  Hip abduction x 15  Sit to stand x 10 Sit to stand with 10 lbs x 10  Seated ball rolls x 10  Standing hip abduction   PT Evaluation: 04/26/23  Therapeutic Activity: 2 min walk Bed mobility to reduce  back strain    PATIENT EDUCATION:  Education details: Pt, POC, body mechanics  Person educated:  Patient Education method: Explanation and Verbal cues Education comprehension: verbalized understanding  HOME EXERCISE PROGRAM: Access Code: [email or web address] URL: [email or web address] Date: 05/17/2023 Prepared by: Karie Mainland  Exercises - Supine Hamstring Stretch with Strap  - 1 x daily - 7 x weekly - 1 sets - 3 reps - 30 hold - Supine ITB Stretch with Strap  - 1 x daily - 7 x weekly - 1 sets - 3 reps - 30 hold - Supine Single Knee to Chest Stretch  - 1 x daily - 7 x weekly - 1 sets - 5 reps - 30 hold - Supine Bridge  - 1 x daily - 7 x weekly - 2 sets - 10 reps - 5 hold - Supine Lower Trunk Rotation  - 1 x daily - 7 x weekly - 2 sets - 10 reps - 10 hold - Standing Hip Abduction with Unilateral Counter Support  - 1 x daily - 7 x weekly - 2 sets - 10 reps - 5 hold   ASSESSMENT:  CLINICAL IMPRESSION: Patient with increased pain in Rt hip and thigh today.  Used her HEP to reduce pain and symptoms. After the session she reported no pain in Rt LE. She did feel a good stretch in the Rt piriformis when sitting edge of mat. Cont POC, asked her to make more visits before she left.    OBJECTIVE IMPAIRMENTS: decreased mobility, difficulty walking, decreased ROM, decreased strength, increased fascial restrictions, increased muscle spasms, impaired flexibility, postural dysfunction, and pain.   ACTIVITY LIMITATIONS: bending, standing, squatting, transfers, locomotion level, and recreation  PARTICIPATION LIMITATIONS: shopping and community activity  PERSONAL FACTORS: Time since onset of injury/illness/exacerbation are also affecting patient's functional outcome.   REHAB POTENTIAL: Excellent  CLINICAL DECISION MAKING: Stable/uncomplicated  EVALUATION COMPLEXITY: Low   GOALS: Goals reviewed with patient? Yes  LONG TERM GOALS: Target date: 06/07/2023    Patient will be independent with home exercise program upon discharge Baseline:  Goal status: ongoing   2.  Patient will be  able to complete sit to stand in less than 13 seconds x 5 Baseline:  Goal status: ongoing   3.  Patient will be able to demonstrate proper lifting and biomechanics for home tasks to avoid exacerbation of pain Baseline:  Goal status: ongoing   4.  Patient will report LE pain as resolved > 75% of the time, episodes of pain < 3/10 pain .  Baseline:  Goal status: ongoing   5.  Patient will be able to return to group exercises classes without increased pain.  Baseline:  Goal status: ongoing   6.  FOTO score will improve to 65% or better  Baseline: 61% Goal status: ongoing   PLAN:  PT FREQUENCY: 1x/week  PT DURATION: 8 weeks  PLANNED INTERVENTIONS: 97164- PT Re-evaluation, 97110-Therapeutic exercises, 97530- Therapeutic activity, [id number]- Neuromuscular re-education, 97535- Self Care, 54098- Manual therapy, Patient/Family education, Balance training, Dry Needling, and Spinal mobilization.  PLAN FOR NEXT SESSION: Check HEP, Nustep, core stability in standing as tolerated   Lucero Ide, PT 05/24/2023, 11:22 AM   Karie Mainland, PT 05/24/23 11:22 AM Phone: [phone] Fax: [phone]

## 2023-05-24 NOTE — Telephone Encounter (Signed)
Last Fill: 04/16/23 3 mL/1 refill  Last OV: 04/25/23 Next OV: 04/29/24  Routing to provider for review/authorization.

## 2023-05-27 ENCOUNTER — Encounter: Payer: HMO | Admitting: Physical Therapy

## 2023-05-29 ENCOUNTER — Encounter: Payer: HMO | Admitting: Physical Therapy

## 2023-05-30 NOTE — Therapy (Unsigned)
OUTPATIENT PHYSICAL THERAPY NOTE   Patient Name: Nina Olson MRN: [id number] DOB:08/01/1945, 78 y.o., female Today's Date: 05/31/2023   PHYSICAL THERAPY DISCHARGE SUMMARY  Visits from Start of Care: 5  Current functional level related to goals / functional outcomes: See below    Remaining deficits: Flexibility, endurance, standing tolerance    Education / Equipment: Lifting , posture, HEP    Patient agrees to discharge. Patient goals were met. Patient is being discharged due to being pleased with the current functional level.   Karie Mainland, PT 05/31/23 11:31 AM Phone: [phone] Fax: [phone]     END OF SESSION:  PT End of Session - 05/31/23 0939     Visit Number 5    Number of Visits 8    Date for PT Re-Evaluation 06/21/23    Authorization Type [employer]    PT Start Time 0935    PT Stop Time 1015    PT Time Calculation (min) 40 min    Activity Tolerance Patient tolerated treatment well    Behavior During Therapy [facility] for tasks assessed/performed                 Past Medical History:  Diagnosis Date   Anemia    Arthritis    Colon polyps    CVA (cerebral vascular accident) (HCC)    CVA (cerebrovascular accident) (HCC)    L MCA   GERD (gastroesophageal reflux disease)    High cholesterol    Hypertension    Osteopenia    Palpitations    Past Surgical History:  Procedure Laterality Date   CESAREAN SECTION     Patient Active Problem List   Diagnosis Date Noted   Abdominal bloating 07/15/2020   Abnormal feces 07/15/2020   Colon cancer screening 07/15/2020   Family history of malignant neoplasm of gastrointestinal tract 07/15/2020   Generalized abdominal pain 07/15/2020   History of colonic polyps 07/15/2020   Rectal bleeding 07/15/2020   HSV (herpes simplex virus) anogenital infection 07/15/2020   Rectal itching 07/15/2020   Bladder prolapse, female, acquired 04/28/2019   Controlled type 2 diabetes mellitus with neuropathy  (HCC) 10/27/2018   OA (osteoarthritis) of hip 05/24/2017   DDD (degenerative disc disease), lumbar 05/24/2017   GERD (gastroesophageal reflux disease) 02/10/2016   Essential hypertension 07/26/2015   Hyperlipidemia 07/26/2015   Insomnia secondary to depression with anxiety 07/26/2015   Osteopenia    Palpitations    Colon polyps     PCP: Lynnea Ferrier MD   REFERRING PROVIDER: Lynnea Ferrier MD   REFERRING DIAG:  Diagnosis  M54.32 (ICD-10-CM) - Left sided sciatica    Rationale for Evaluation and Treatment: Rehabilitation  THERAPY DIAG:  Radiculopathy, lumbar region  Dizziness and giddiness  Difficulty in walking, not elsewhere classified  ONSET DATE: approx. 5-6 yrs   SUBJECTIVE:  SUBJECTIVE STATEMENT: Pt's last visit is today and she feels ready for that.  She has not had any pain this whole week.  [facility]    Patient as had this issue for 5-6 years.  It used to ease off after a few days. The pain is from her lower back to her hip/thigh, stops at the knee.  Sometimes the Rt hip feels that way too.  Her symptoms limit her from /  She had it a couple of weeks ago, had to get a wheelchair to et on the plane.  At that time she could barely walking. She has some soreness now. She has periods of pain that last about 3 days . Her leg does feel a bit weak.  Cramping. She does have LE numbness due to neuropathy but only in her feet.  Like I'm walking on cardboard. 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   PERTINENT HISTORY:  Diagnosis  M54.32 (ICD-10-CM) - Left sided sciatica   Vertigo  Donita Brooks, MD  PAIN:  Are you having pain? Yes: NPRS scale: 0/10 Pain location: Lt low back  Pain description: sore today but can be severe and spasm when episodes occur Aggravating  factors: turning wrong, lifting, bending  Relieving factors: Pain muscle rub, laying down  Pain can be 9/10 excruciating.    PRECAUTIONS: None  RED FLAGS: None   WEIGHT BEARING RESTRICTIONS: No  FALLS:  Has patient fallen in last 6 months? No  LIVING ENVIRONMENT: Lives with: lives with their spouse Lives in: House/apartment Stairs: No Has following equipment at home: None  OCCUPATION: Retired, Psychologist, occupational  PLOF: Independent gardening, travel  PATIENT GOALS: I want to be able to get to the point where I wont be in pain   NEXT MD VISIT: as needed  OBJECTIVE:  Note: Objective measures were completed at Evaluation unless otherwise noted.  DIAGNOSTIC FINDINGS:  XR 04/20/23:   Grade 1 anterolisthesis of L4 on L5. Alignment otherwise within normal limits. No significant vertebral body height loss. Moderate to severe facet degenerative changes seen at L5-S1.   IMPRESSION: Multilevel degenerative changes of the lumbar spine greatest involving the facets at L5-S1.    PATIENT SURVEYS:  FOTO 61%  goal is 65% 64% on DC.    COGNITION: Overall cognitive status: Within functional limits for tasks assessed     SENSATION: Pt reports L > R numbness in foot   MUSCLE LENGTH: Hamstrings: WFL Thomas test: Tight quads and hip flexors   POSTURE: increased thoracic kyphosis  PALPATION: Soreness to palpation along Lt L5-S1 paraspinals and centrally   LUMBAR ROM:   AROM eval 05/31/23  Flexion WFL, no pain  WNL  Extension 50%  No pain    Right lateral flexion [facility]  Left lateral flexion [facility] WFL  Right rotation 25% WFL  Left rotation 25% WFL   (Blank rows = not tested)  LOWER EXTREMITY ROM:     Passive  Right eval Left eval  Hip flexion [facility]  Hip extension    Hip abduction    Hip adduction    Hip internal rotation    Hip external rotation    Knee flexion [facility] WFL  Knee extension [facility]   Ankle dorsiflexion    Ankle plantarflexion    Ankle  inversion    Ankle eversion     (Blank rows = not tested)  LOWER EXTREMITY MMT:    MMT Right eval  Left eval Rt./Lt.  05/31/23  Hip flexion 4+ 4+ 5/5 bilateral  Hip extension     Hip abduction 4 4 4+/5 bilateral  Hip adduction     Hip internal rotation     Hip external rotation     Knee flexion 5 5   Knee extension 5 5   Ankle dorsiflexion 5 5   Ankle plantarflexion     Ankle inversion     Ankle eversion      (Blank rows = not tested)  LUMBAR SPECIAL TESTS:  Straight leg raise test: Negative  FUNCTIONAL TESTS:  5 times sit to stand: 19 sec  2 minute walk test: 430 feet   GAIT: Distance walked: 430 Assistive device utilized: None Level of assistance: Modified independence Comments: good pace, arm swing, no limp but does report increased Rt knee pain   TREATMENT DATE:  [facility] Adult PT Treatment:                                                DATE: 05/31/23 Therapeutic Activity: NuStep L5 UE and LE for 6 min  Bridge x 10  Lateral hip stretch/piriformis Sidelying hip abduction  Sit to stand  Standing shoulder extension springboard  High march with shoulder extension Lifting lighter items- golfer's lift   [facility] Adult PT Treatment:                                                DATE: 05/24/23 Therapeutic Activity: NuStep LE and UE for 7 min  Hip stretching: Rt over Lt. Knee press, pull , knees crossed added rotation  Hamstring and ITB with strap Rt LE x 3  Bridging with GTB x 15  Supine bent knee fall out GTB x 10 each LE  Bridge hover with clam GTB x 10  Sidelying clam and hip abd with GTB x 10 x 2  Seated ball roll outs with cues to flex lower spine forward and lateral  Seated piriformis stretch figure 4 each side x 3 , 30 sec    [facility] Adult PT Treatment:                                                DATE: 05/17/23 Therapeutic Activity: Hamstring stretch/ITB 30 sec x 3  Knee to chest x 3 , 10 sec  Lower trunk rotation x 10 x 10 sec  Bridge x 15  Standing hip  abduction and extension at countertop with step Step ups 5 lbs 0-1 UE support for balance  Added on 5 lbs x 10  Squat at countertop  Standing row x 10  Standing shoulder ext x 10 added high knee march x 10  Sit to stand x 10  Sit to stand with 10 lbs KB march 1 min     [facility] Adult PT Treatment:  DATE: 05/03/23 Therapeutic Exercise: NuStep L4 UE and LE for 5 min  Hamstring stretch/ITB 30 sec x 3  Knee to chest x 3 , 10 sec  Lower trunk rotation x 10 x 10 sec  Bridge x 15  Hip abduction x 15  Sit to stand x 10 Sit to stand with 10 lbs x 10  Seated ball rolls x 10  Standing hip abduction   PT Evaluation: 04/26/23                                                                                                                              Therapeutic Activity: 2 min walk Bed mobility to reduce  back strain    PATIENT EDUCATION:  Education details: Pt, POC, body mechanics  Person educated: Patient Education method: Explanation and Verbal cues Education comprehension: verbalized understanding  HOME EXERCISE PROGRAM: Access Code: [email or web address] URL: [email or web address] Date: 05/17/2023 Prepared by: Karie Mainland  Exercises - Supine Hamstring Stretch with Strap  - 1 x daily - 7 x weekly - 1 sets - 3 reps - 30 hold - Supine ITB Stretch with Strap  - 1 x daily - 7 x weekly - 1 sets - 3 reps - 30 hold - Supine Single Knee to Chest Stretch  - 1 x daily - 7 x weekly - 1 sets - 5 reps - 30 hold - Supine Bridge  - 1 x daily - 7 x weekly - 2 sets - 10 reps - 5 hold - Supine Lower Trunk Rotation  - 1 x daily - 7 x weekly - 2 sets - 10 reps - 10 hold - Standing Hip Abduction with Unilateral Counter Support  - 1 x daily - 7 x weekly - 2 sets - 10 reps - 5 hold   ASSESSMENT:  CLINICAL IMPRESSION: Patient has met her goals and feels ready for discharge.  She understands how to reduce her pain with her stretches.  She has improved her  hip strength and functional mobility, FOTO goal is met.    OBJECTIVE IMPAIRMENTS: decreased mobility, difficulty walking, decreased ROM, decreased strength, increased fascial restrictions, increased muscle spasms, impaired flexibility, postural dysfunction, and pain.   ACTIVITY LIMITATIONS: bending, standing, squatting, transfers, locomotion level, and recreation  PARTICIPATION LIMITATIONS: shopping and community activity  PERSONAL FACTORS: Time since onset of injury/illness/exacerbation are also affecting patient's functional outcome.   REHAB POTENTIAL: Excellent  CLINICAL DECISION MAKING: Stable/uncomplicated  EVALUATION COMPLEXITY: Low   GOALS: Goals reviewed with patient? Yes  LONG TERM GOALS: Target date: 06/07/2023    Patient will be independent with home exercise program upon discharge Baseline:  Goal status: MET   2.  Patient will be able to complete sit to stand in less than 13 seconds x 5 Baseline:  Goal status: MET   3.  Patient will be able to demonstrate proper lifting and biomechanics for home tasks to avoid exacerbation of pain Baseline:  Goal status: MET   4.  Patient will report LE pain as resolved > 75% of the time, episodes of pain < 3/10 pain .  Baseline:  Goal status: MET , none this week   5.  Patient will be able to return to group exercises classes without increased pain.  Baseline:  Goal status: partially met , intends to do this next week   6.  FOTO score will improve to 65% or better  Baseline: 61% Goal status: partially met , improved to 64%   PLAN:  PT FREQUENCY: 1x/week  PT DURATION: 8 weeks  PLANNED INTERVENTIONS: 97164- PT Re-evaluation, 97110-Therapeutic exercises, 97530- Therapeutic activity, [id number]- Neuromuscular re-education, 97535- Self Care, 54098- Manual therapy, Patient/Family education, Balance training, Dry Needling, and Spinal mobilization.  PLAN FOR NEXT SESSION: DC  Meaghan Whistler, PT 05/31/2023, 11:31 AM   Karie Mainland, PT 05/31/23 11:31 AM Phone: [phone] Fax: [phone]

## 2023-05-31 ENCOUNTER — Encounter: Payer: Self-pay | Admitting: Physical Therapy

## 2023-05-31 ENCOUNTER — Ambulatory Visit: Payer: PPO | Admitting: Physical Therapy

## 2023-05-31 DIAGNOSIS — M5416 Radiculopathy, lumbar region: Secondary | ICD-10-CM | POA: Diagnosis not present

## 2023-05-31 DIAGNOSIS — R262 Difficulty in walking, not elsewhere classified: Secondary | ICD-10-CM

## 2023-05-31 DIAGNOSIS — R42 Dizziness and giddiness: Secondary | ICD-10-CM

## 2023-06-11 ENCOUNTER — Encounter: Payer: Self-pay | Admitting: Family Medicine

## 2023-06-11 ENCOUNTER — Ambulatory Visit (INDEPENDENT_AMBULATORY_CARE_PROVIDER_SITE_OTHER): Admitting: Family Medicine

## 2023-06-11 VITALS — BP 120/62 | HR 67 | Temp 97.8°F | Resp 12 | Ht 65.0 in | Wt 177.6 lb

## 2023-06-11 DIAGNOSIS — G629 Polyneuropathy, unspecified: Secondary | ICD-10-CM | POA: Diagnosis not present

## 2023-06-11 NOTE — Progress Notes (Signed)
 Subjective:    Patient ID: Nina Olson, female    DOB: 1946/01/30, 78 y.o.   MRN: [id number]  Foot Pain   Patient states that she had intense pain in her left foot.  It started in the left great toe and then radiated to the second and third toe around the MTP joint.  It was sharp and intense.  She felt numb and hot at the same time.  She did not want to even touch the floor.  The pain was limited to that area.  She also felt like her toes were tight like they had a band wrapped around the.  She also had some numbness.  She describes it as a sensation of "cardboard underneath her feet when she is walking".  Today the foot feels normal.  She denies any pain in her toes.  There is no redness.  There is no swelling.  There is no bruising.  There is no tenderness to palpation of the metatarsal joints or the interphalangeal joints.  There is no visible deformity. Past Medical History:  Diagnosis Date   Anemia    Arthritis    Colon polyps    CVA (cerebral vascular accident) (HCC)    CVA (cerebrovascular accident) (HCC)    L MCA   GERD (gastroesophageal reflux disease)    High cholesterol    Hypertension    Osteopenia    Palpitations    Past Surgical History:  Procedure Laterality Date   CESAREAN SECTION     Current Outpatient Medications on File Prior to Visit  Medication Sig Dispense Refill   Accu-Chek Softclix Lancets lancets USE TO CHECK CBG ONCE A DAY 100 each 0   amoxicillin-clavulanate (AUGMENTIN) 875-125 MG tablet Take 1 tablet by mouth 2 (two) times daily. 20 tablet 0   atorvastatin (LIPITOR) 80 MG tablet Take 1 tablet (80 mg total) by mouth daily. 90 tablet 3   blood glucose meter kit and supplies Dispense based on patient and insurance preference. Check CBG once a day (FOR ICD-10 E10.9, E11.9). 1 each 0   esomeprazole (NEXIUM) 40 MG capsule Take 1 capsule by mouth once daily 90 capsule 1   famotidine (PEPCID) 40 MG tablet Take 1 tablet by mouth once daily 90 tablet 1    gabapentin (NEURONTIN) 300 MG capsule Take 1 capsule (300 mg total) by mouth at bedtime as needed (nerve pain in feet). 30 capsule 3   glucose blood (ACCU-CHEK GUIDE) test strip USE TO CHECK CBG ONCE A DAY 100 each 6   hydrochlorothiazide (HYDRODIURIL) 25 MG tablet Take 0.5 tablets (12.5 mg total) by mouth daily. Pt reports she takes half of 25 mg tablet 90 tablet 3   losartan (COZAAR) 100 MG tablet Take 1 tablet (100 mg total) by mouth daily. 90 tablet 1   Multiple Vitamin (MULTI-VITAMIN) tablet Take by mouth.     Omega-3 Fatty Acids (FISH OIL PO) Take by mouth as directed.     Probiotic Product (PROBIOTIC DAILY PO) Take by mouth.     Semaglutide,0.25 or 0.5MG /DOS, 2 MG/3ML SOPN Inject 0.5 mg into the skin once a week. 3 mL 0   sucralfate (CARAFATE) 1 g tablet TAKE 1 TABLET BY MOUTH 4 TIMES DAILY WITH  MEALS  AND  AT  BEDTIME. 120 tablet 0   traZODone (DESYREL) 50 MG tablet Take 0.5-1 tablets (25-50 mg total) by mouth at bedtime as needed for sleep. 90 tablet 1   valACYclovir (VALTREX) 1000 MG tablet Take 1 tablet (  1,000 mg total) by mouth 2 (two) times daily. (Patient taking differently: Take 1,000 mg by mouth 2 (two) times daily. Takes as needed.) 14 tablet 5   No current facility-administered medications on file prior to visit.   No Known Allergies Social History   Socioeconomic History   Marital status: Married    Spouse name: Not on file   Number of children: 3   Years of education: Not on file   Highest education level: Not on file  Occupational History   Not on file  Tobacco Use   Smoking status: Former   Smokeless tobacco: Former    Quit date: 04/09/1978  Vaping Use   Vaping status: Never Used  Substance and Sexual Activity   Alcohol use: No   Drug use: No   Sexual activity: Never  Other Topics Concern   Not on file  Social History Narrative   Are you right handed or left handed? Right   Are you currently employed ? no   What is your current occupation?retired   Do you  live at home alone? No    Who lives with you? husband   What type of home do you live in: 1 story or 2 story? one    Caffeine 1 soda a day    Social Drivers of Corporate investment banker Strain: Low Risk  (04/25/2023)   Overall Financial Resource Strain (CARDIA)    Difficulty of Paying Living Expenses: Not hard at all  Food Insecurity: No Food Insecurity (04/25/2023)   Hunger Vital Sign    Worried About Running Out of Food in the Last Year: Never true    Ran Out of Food in the Last Year: Never true  Transportation Needs: No Transportation Needs (04/25/2023)   PRAPARE - Administrator, Civil Service (Medical): No    Lack of Transportation (Non-Medical): No  Physical Activity: Inactive (04/25/2023)   Exercise Vital Sign    Days of Exercise per Week: 0 days    Minutes of Exercise per Session: 0 min  Stress: No Stress Concern Present (04/25/2023)   [employer] of Occupational Health - Occupational Stress Questionnaire    Feeling of Stress : Not at all  Social Connections: Socially Integrated (04/25/2023)   Social Connection and Isolation Panel [NHANES]    Frequency of Communication with Friends and Family: Three times a week    Frequency of Social Gatherings with Friends and Family: Once a week    Attends Religious Services: More than 4 times per year    Active Member of [employer] or Organizations: Yes    Attends Engineer, structural: More than 4 times per year    Marital Status: Married  Catering manager Violence: Not At Risk (04/25/2023)   Humiliation, Afraid, Rape, and Kick questionnaire    Fear of Current or Ex-Partner: No    Emotionally Abused: No    Physically Abused: No    Sexually Abused: No     Review of Systems  All other systems reviewed and are negative.      Objective:   Physical Exam Constitutional:      Appearance: Normal appearance. She is normal weight.  Cardiovascular:     Rate and Rhythm: Normal rate and regular rhythm.     Pulses:  Normal pulses.          Dorsalis pedis pulses are 2+ on the left side.       Posterior tibial pulses are 2+ on the left  side.     Heart sounds: Normal heart sounds. No murmur heard.    No friction rub. No gallop.  Pulmonary:     Effort: Pulmonary effort is normal. No respiratory distress.     Breath sounds: Normal breath sounds. No stridor. No wheezing, rhonchi or rales.  Musculoskeletal:     Right lower leg: No edema.     Left lower leg: No edema.  Feet:     Left foot:     Skin integrity: No ulcer, blister, skin breakdown, erythema, warmth or callus.  Neurological:     General: No focal deficit present.     Mental Status: She is alert and oriented to person, place, and time.           Assessment & Plan:  Neuropathy Today her foot exam is completely normal.  I do not feel that the patient needs any x-rays as there is no visible deformity or bruising or swelling and I am unable to reproduce the pain with palpation.  I believe that this was likely neuropathic pain related to her peripheral neuropathy.  I recommended that she use gabapentin 300 mg every 8 hours and pain in the future.

## 2023-06-20 ENCOUNTER — Ambulatory Visit
Admission: RE | Admit: 2023-06-20 | Discharge: 2023-06-20 | Disposition: A | Discharge status: Home / Self Care | Payer: PPO | Source: Ambulatory Visit | Attending: Family Medicine | Admitting: Family Medicine

## 2023-06-20 DIAGNOSIS — Z1231 Encounter for screening mammogram for malignant neoplasm of breast: Secondary | ICD-10-CM

## 2023-06-24 ENCOUNTER — Ambulatory Visit (INDEPENDENT_AMBULATORY_CARE_PROVIDER_SITE_OTHER): Admitting: Family Medicine

## 2023-06-24 ENCOUNTER — Encounter: Payer: Self-pay | Admitting: Family Medicine

## 2023-06-24 ENCOUNTER — Ambulatory Visit: Payer: Self-pay | Admitting: Family Medicine

## 2023-06-24 VITALS — BP 126/70 | HR 97 | Temp 97.8°F | Ht 65.0 in | Wt 168.2 lb

## 2023-06-24 DIAGNOSIS — R42 Dizziness and giddiness: Secondary | ICD-10-CM

## 2023-06-24 NOTE — Progress Notes (Signed)
 Subjective:  HPI: Nina Olson is a 78 y.o. female presenting on 06/24/2023 for Dizziness (Pt c/o dizzy spells off and on x 2 weeks. Pt states in the last week has been daily. Pt c/o burry vision since dizziness started. )   HPI Patient is in today for worsening dizziness over last week associated with loss of balance. She reports this dizziness has been going on for about a year. 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 Moderate for age cerebral white matter and mild for age other signal changes in the bilateral brain, compatible with chronic small vessel disease. Carotid duplex was also negative. She did complete vestibular PT.  She describes her symptoms today as "just dizzy" with blurred vision in her peripheral vision. She is unsteady when walking as well. This is constant throughout the day, nothing makes this better or worse. This does feel like this may be lightheadedness to her but is difficult for her to describe She is not monitoring her BP. Blood sugars have been well controlled, last reading was 77 when she checked last week. She has been taking Ozempic 0.5mg  weekly for 3 months on Sundays, no recent dose changes. No new medications.  Review of Systems  All other systems reviewed and are negative.   Relevant past medical history reviewed and updated as indicated.   Past Medical History:  Diagnosis Date   Anemia    Arthritis    Colon polyps    CVA (cerebral vascular accident) (HCC)    CVA (cerebrovascular accident) (HCC)    L MCA   GERD (gastroesophageal reflux disease)    High cholesterol    Hypertension    Osteopenia    Palpitations      Past Surgical History:  Procedure Laterality Date   CESAREAN SECTION      Allergies and medications reviewed and updated.   Current Outpatient Medications:    Accu-Chek Softclix Lancets lancets, USE TO CHECK CBG ONCE  A DAY, Disp: 100 each, Rfl: 0   atorvastatin (LIPITOR) 80 MG tablet, Take 1 tablet (80 mg total) by mouth daily., Disp: 90 tablet, Rfl: 3   blood glucose meter kit and supplies, Dispense based on patient and insurance preference. Check CBG once a day (FOR ICD-10 E10.9, E11.9)., Disp: 1 each, Rfl: 0   esomeprazole (NEXIUM) 40 MG capsule, Take 1 capsule by mouth once daily, Disp: 90 capsule, Rfl: 1   famotidine (PEPCID) 40 MG tablet, Take 1 tablet by mouth once daily, Disp: 90 tablet, Rfl: 1   gabapentin (NEURONTIN) 300 MG capsule, Take 1 capsule (300 mg total) by mouth at bedtime as needed (nerve pain in feet)., Disp: 30 capsule, Rfl: 3   glucose blood (ACCU-CHEK GUIDE) test strip, USE TO CHECK CBG ONCE A DAY, Disp: 100 each, Rfl: 6   hydrochlorothiazide (HYDRODIURIL) 25 MG tablet, Take 0.5 tablets (12.5 mg total) by mouth daily. Pt reports she takes half of 25 mg tablet, Disp: 90 tablet, Rfl: 3   losartan (COZAAR) 100 MG tablet, Take 1 tablet (100 mg total) by mouth daily., Disp: 90 tablet, Rfl: 1   Multiple Vitamin (MULTI-VITAMIN) tablet, Take by mouth., Disp: , Rfl:    Omega-3 Fatty Acids (FISH OIL PO), Take by mouth as directed., Disp: , Rfl:    Probiotic Product (PROBIOTIC DAILY PO), Take by mouth., Disp: , Rfl:    Semaglutide,0.25 or 0.5MG /DOS, 2  MG/3ML SOPN, Inject 0.5 mg into the skin once a week., Disp: 3 mL, Rfl: 0   sucralfate (CARAFATE) 1 g tablet, TAKE 1 TABLET BY MOUTH 4 TIMES DAILY WITH  MEALS  AND  AT  BEDTIME., Disp: 120 tablet, Rfl: 0   traZODone (DESYREL) 50 MG tablet, Take 0.5-1 tablets (25-50 mg total) by mouth at bedtime as needed for sleep., Disp: 90 tablet, Rfl: 1   valACYclovir (VALTREX) 1000 MG tablet, Take 1 tablet (1,000 mg total) by mouth 2 (two) times daily. (Patient taking differently: Take 1,000 mg by mouth 2 (two) times daily. Takes as needed.), Disp: 14 tablet, Rfl: 5  No Known Allergies  Objective:   BP 126/70   Pulse 97   Temp 97.8 F (36.6 C)   Ht 5\' 5"   (1.651 m)   Wt 168 lb 3.2 oz (76.3 kg)   SpO2 98%   BMI 27.99 kg/m      06/24/2023   11:48 AM 06/11/2023    2:20 PM 04/16/2023    8:13 AM  Vitals with BMI  Height 5\' 5"  5\' 5"  5\' 5"   Weight 168 lbs 3 oz 177 lbs 10 oz 176 lbs  BMI 27.99 29.55 29.29  Systolic 126 120 161  Diastolic 70 62 80  Pulse 97 67 90     Physical Exam Vitals and nursing note reviewed.  Constitutional:      Appearance: Normal appearance. She is normal weight.  HENT:     Head: Normocephalic and atraumatic.  Cardiovascular:     Rate and Rhythm: Regular rhythm. Tachycardia present.     Pulses: Normal pulses.     Heart sounds: Normal heart sounds.  Pulmonary:     Effort: Pulmonary effort is normal.     Breath sounds: Normal breath sounds.  Skin:    General: Skin is warm and dry.  Neurological:     General: No focal deficit present.     Mental Status: She is alert and oriented to person, place, and time. Mental status is at baseline.     GCS: GCS eye subscore is 4. GCS verbal subscore is 5. GCS motor subscore is 6.     Cranial Nerves: Cranial nerves 2-12 are intact.     Sensory: Sensation is intact.     Motor: Motor function is intact.     Coordination: Romberg sign positive. Coordination normal. Finger-Nose-Finger Test and Heel to [facility] Test normal.     Gait: Gait is intact.  Psychiatric:        Mood and Affect: Mood normal.        Behavior: Behavior normal.        Thought Content: Thought content normal.        Judgment: Judgment normal.     Assessment & Plan:  Dizziness Assessment & Plan: Persistent symptoms for over 1 year. Neuro exam unremarkable with CN 2-12 intact however romberg was positive. Glucose 133 on fingerstick and EKG showed sinus tachycardia. MRI and carotid US negative. Has completed PT. Advised to seek immediate medical care for unilateral weakness, confusion or AMS, slurred speech, facial droop. Will refer to Neurology for further evaluation.  Orders: -     Glucose, fingerstick  (stat) -     EKG 12-Lead -     CBC with Differential/Platelet -     COMPLETE METABOLIC PANEL WITH GFR  Other orders -     GLUCOSE [id number]     Follow up plan: Return if symptoms worsen or fail to improve.  Park Meo, FNP

## 2023-06-24 NOTE — Telephone Encounter (Signed)
 Red Word that prompted transfer to Nurse Triage: Caller states she's experiencing dizziness and not sure If she's having another stroke and numbness in her feet and hands. Caller states she's experiencing pain in her body feels like pins and needles.                 Chief Complaint: "I've been having dizziness but it's worse. I've been having tingling in my hands and feet too, but that's my neuropathy."  Symptoms: Above, history of stroke. Frequency: Weekend Pertinent Negatives: Patient denies  Disposition: [] ED /[] Urgent Care (no appt availability in office) / [x] Appointment(In office/virtual)/ []  [facility]/ [] Home Care/ [] Refused Recommended Disposition /[] [place] [facility]/ []  Follow-up with PCP Additional Notes: Agrees with appointment. Reason for Disposition  [1] MODERATE dizziness (e.g., interferes with normal activities) AND [2] has been evaluated by doctor (or NP/PA) for this  Answer Assessment - Initial Assessment Questions 1. DESCRIPTION: "Describe your dizziness."     Dizzy 2. LIGHTHEADED: "Do you feel lightheaded?" (e.g., somewhat faint, woozy, weak upon standing)     Yes 3. VERTIGO: "Do you feel like either you or the room is spinning or tilting?" (i.e. vertigo)     No 4. SEVERITY: "How bad is it?"  "Do you feel like you are going to faint?" "Can you stand and walk?"   - MILD: Feels slightly dizzy, but walking normally.   - MODERATE: Feels unsteady when walking, but not falling; interferes with normal activities (e.g., school, work).   - SEVERE: Unable to walk without falling, or requires assistance to walk without falling; feels like passing out now.      Moderate 5. ONSET:  "When did the dizziness begin?"     Worse last week 6. AGGRAVATING FACTORS: "Does anything make it worse?" (e.g., standing, change in head position)     Walking 7. HEART RATE: "Can you tell me your heart rate?" "How many beats in 15 seconds?"  (Note: not all patients can do this)        No 8. CAUSE: "What do you think is causing the dizziness?"     Unsure 9. RECURRENT SYMPTOM: "Have you had dizziness before?" If Yes, ask: "When was the last time?" "What happened that time?"     Yes 10. OTHER SYMPTOMS: "Do you have any other symptoms?" (e.g., fever, chest pain, vomiting, diarrhea, bleeding)      Tingling hands, feet 11. PREGNANCY: "Is there any chance you are pregnant?" "When was your last menstrual period?"       No  Protocols used: Dizziness - Lightheadedness-A-AH

## 2023-06-24 NOTE — Assessment & Plan Note (Addendum)
 Persistent symptoms for over 1 year. Neuro exam unremarkable with CN 2-12 intact however romberg was positive. Glucose 133 on fingerstick and EKG showed sinus tachycardia. MRI and carotid US negative. Has completed PT. Advised to seek immediate medical care for unilateral weakness, confusion or AMS, slurred speech, facial droop. Will refer to Neurology for further evaluation.

## 2023-06-25 LAB — CBC WITH DIFFERENTIAL/PLATELET
Absolute Lymphocytes: 1188 {cells}/uL (ref 850–3900)
Absolute Monocytes: 520 {cells}/uL (ref 200–950)
Basophils Absolute: 31 {cells}/uL (ref 0–200)
Basophils Relative: 0.6 %
Eosinophils Absolute: 179 {cells}/uL (ref 15–500)
Eosinophils Relative: 3.5 %
HCT: 38.4 % (ref 35.0–45.0)
Hemoglobin: 12.6 g/dL (ref 11.7–15.5)
MCH: 28.3 pg (ref 27.0–33.0)
MCHC: 32.8 g/dL (ref 32.0–36.0)
MCV: 86.1 fL (ref 80.0–100.0)
MPV: 9.4 fL (ref 7.5–12.5)
Monocytes Relative: 10.2 %
Neutro Abs: 3182 {cells}/uL (ref 1500–7800)
Neutrophils Relative %: 62.4 %
Platelets: 285 10*3/uL (ref 140–400)
RBC: 4.46 10*6/uL (ref 3.80–5.10)
RDW: 14.3 % (ref 11.0–15.0)
Total Lymphocyte: 23.3 %
WBC: 5.1 10*3/uL (ref 3.8–10.8)

## 2023-06-25 LAB — COMPLETE METABOLIC PANEL WITH GFR
AG Ratio: 1.4 (calc) (ref 1.0–2.5)
ALT: 15 U/L (ref 6–29)
AST: 17 U/L (ref 10–35)
Albumin: 4.1 g/dL (ref 3.6–5.1)
Alkaline phosphatase (APISO): 108 U/L (ref 37–153)
BUN/Creatinine Ratio: 14 (calc) (ref 6–22)
BUN: 14 mg/dL (ref 7–25)
CO2: 28 mmol/L (ref 20–32)
Calcium: 9.5 mg/dL (ref 8.6–10.4)
Chloride: 105 mmol/L (ref 98–110)
Creat: 1.03 mg/dL — ABNORMAL HIGH (ref 0.60–1.00)
Globulin: 3 g/dL (ref 1.9–3.7)
Glucose, Bld: 90 mg/dL (ref 65–99)
Potassium: 4 mmol/L (ref 3.5–5.3)
Sodium: 142 mmol/L (ref 135–146)
Total Bilirubin: 0.7 mg/dL (ref 0.2–1.2)
Total Protein: 7.1 g/dL (ref 6.1–8.1)
eGFR: 56 mL/min/{1.73_m2} — ABNORMAL LOW (ref >=60)

## 2023-06-25 LAB — GLUCOSE 16585: Glucose: 133 mg/dL — ABNORMAL HIGH (ref 65–99)

## 2023-07-03 ENCOUNTER — Encounter: Payer: Self-pay | Admitting: Neurology

## 2023-07-03 ENCOUNTER — Ambulatory Visit (INDEPENDENT_AMBULATORY_CARE_PROVIDER_SITE_OTHER): Admitting: Neurology

## 2023-07-03 VITALS — BP 138/80 | HR 107 | Ht 65.0 in | Wt 172.0 lb

## 2023-07-03 DIAGNOSIS — G20C Parkinsonism, unspecified: Secondary | ICD-10-CM | POA: Diagnosis not present

## 2023-07-03 DIAGNOSIS — R202 Paresthesia of skin: Secondary | ICD-10-CM | POA: Diagnosis not present

## 2023-07-03 DIAGNOSIS — R42 Dizziness and giddiness: Secondary | ICD-10-CM

## 2023-07-03 DIAGNOSIS — R2689 Other abnormalities of gait and mobility: Secondary | ICD-10-CM | POA: Diagnosis not present

## 2023-07-03 DIAGNOSIS — R200 Anesthesia of skin: Secondary | ICD-10-CM

## 2023-07-03 DIAGNOSIS — R209 Unspecified disturbances of skin sensation: Secondary | ICD-10-CM | POA: Diagnosis not present

## 2023-07-03 DIAGNOSIS — I639 Cerebral infarction, unspecified: Secondary | ICD-10-CM

## 2023-07-03 MED ORDER — CARBIDOPA-LEVODOPA 25-100 MG PO TABS
0.5000 | ORAL_TABLET | Freq: Three times a day (TID) | ORAL | 5 refills | Status: DC
Start: 2023-07-03 — End: 2024-01-31

## 2023-07-03 NOTE — Progress Notes (Signed)
 I saw Nina Olson in neurology clinic on 07/03/23 in follow up for tingling in bilateral feet and dizziness.  HPI: Nina Olson is a 78 y.o. year old female with a history of DM, HTN, HLD, GERD, former smoker who we last saw on 04/26/22.  To briefly review: Initial consult 04/26/22: Symptoms started about 2 years ago (2022) in bilateral feet with tingling. It would feel like she was "walking on cardboard." She saw podiatry who felt she may have a neuroma as she was having particular pain in right great toe. She got an injection that helped a little, but then symptoms returned. Over the last 2 months, her feet have felt like they are tightening (left > right). She denies significant cramps. She sometimes feels off balance, but denies any falls. She endorses back pain and shooting pains from back into buttocks into legs (calling it sciatica). It is also an achy sensation. If she moves in certain ways, it can "grab" her and make it painful to move in certain way. She has never been given medication for her symptoms. She uses over the counter icy hot like things that help with the back. She has never used anything on her feet.   She does not have significant symptoms in her hands. She will occasionally wake up with a hand tingling. Of note, patient broke her left hand 25 years ago and has some residual deformity.   Patient also mentions that her right knee has been hurting for about a month and that she has swelling on the medial aspect of her right knee.   Of note, patient is on B complex and zinc for 2-3 years.   Podiatry had mentioned EMG to the patient, but she has not had this or scheduled it at this point.   She does not report any constitutional symptoms like fever, night sweats, anorexia or unintentional weight loss.   EtOH use: No, has not drank much since 1980s Restrictive diet? No Family history of neuropathy/myopathy/NM disease? Mother has pain in legs too (?neuropathy)  Most  recent Assessment and Plan (04/26/22): Nina Olson is a 78 y.o. female who presents for evaluation of tingling in bilateral feet. She has a relevant medical history of DM, HTN, HLD, GERD, former smoker. Her neurological examination is essentially normal today. Available diagnostic data is significant for HbA1c 6.6. 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 Her risk factors include diabetes. I will send labs to look for other treatable causes.   PLAN: -Blood work: IFE, B6, copper, zinc -Stop B complex and zinc supplementation -Discussed EMG, will defer for now due to mild nature of symptoms -Patient to discuss diabetes treatment with PCP -Recommend Lidocaine cream PRN -Recommend alpha lipoic acid 600 mg once or twice daily -Discussed prescription medication for tingling, but patient would prefer to try over the counter options first   -Consult to dermatology for irregular looking mole on right anterior lower leg.  -Return to clinic in 6 months   Since their last visit: She has had worsening dizziness since early 06/2023 associated with loss of balance. The dizziness has been present for about a year. She first mentioned to her PCP in 10/2022 by my chart review. BP was low that day so hydrochlorothiazide was stopped. This did not seem to affect symptoms. 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  She describes the "dizziness"  as a boat rocking sensation and lightheadedness. It started intermittent and is now constant. There is no room spinning. Bending over or turning too fast will provoke her symptoms. She feels her walking is off. She denies any recent falls. She denies any significant hearing problems.   She denies problems with smell but food sometimes tastes funny. She denies any REM sleep behavior. She does freeze when trying to move sometimes. She also  sometimes notices a tremor at rest.  Patient completed vestibular rehab but continued to have symptoms. She is not sure if this helped. She did not go long though because she felt she could do similar things at home. She has not kept on this though. She was sent to PT sciatica. She was prescribed gabapentin 300 mg at bedtime for pain in 2024, but she does not really take this often.  Patient is not on aspirin though she has been told to take. She is on atorvastatin 80 mg daily.  Her tingling in the feet is unchanged from prior.  Patient saw dermatology and was told she had eczema and she got cream for it.   MEDICATIONS:  Outpatient Encounter Medications as of 07/03/2023  Medication Sig   Accu-Chek Softclix Lancets lancets USE TO CHECK CBG ONCE A DAY   atorvastatin (LIPITOR) 80 MG tablet Take 1 tablet (80 mg total) by mouth daily.   blood glucose meter kit and supplies Dispense based on patient and insurance preference. Check CBG once a day (FOR ICD-10 E10.9, E11.9).   esomeprazole (NEXIUM) 40 MG capsule Take 1 capsule by mouth once daily   famotidine (PEPCID) 40 MG tablet Take 1 tablet by mouth once daily   gabapentin (NEURONTIN) 300 MG capsule Take 1 capsule (300 mg total) by mouth at bedtime as needed (nerve pain in feet).   glucose blood (ACCU-CHEK GUIDE) test strip USE TO CHECK CBG ONCE A DAY   hydrochlorothiazide (HYDRODIURIL) 25 MG tablet Take 0.5 tablets (12.5 mg total) by mouth daily. Pt reports she takes half of 25 mg tablet   losartan (COZAAR) 100 MG tablet Take 1 tablet (100 mg total) by mouth daily.   Multiple Vitamin (MULTI-VITAMIN) tablet Take by mouth.   Omega-3 Fatty Acids (FISH OIL PO) Take by mouth as directed.   Probiotic Product (PROBIOTIC DAILY PO) Take by mouth.   Semaglutide,0.25 or 0.5MG /DOS, 2 MG/3ML SOPN Inject 0.5 mg into the skin once a week.   sucralfate (CARAFATE) 1 g tablet TAKE 1 TABLET BY MOUTH 4 TIMES DAILY WITH  MEALS  AND  AT  BEDTIME.   traZODone  (DESYREL) 50 MG tablet Take 0.5-1 tablets (25-50 mg total) by mouth at bedtime as needed for sleep.   valACYclovir (VALTREX) 1000 MG tablet Take 1 tablet (1,000 mg total) by mouth 2 (two) times daily. (Patient taking differently: Take 1,000 mg by mouth 2 (two) times daily. Takes as needed.)   No facility-administered encounter medications on file as of 07/03/2023.    PAST MEDICAL HISTORY: Past Medical History:  Diagnosis Date   Anemia    Arthritis    Colon polyps    CVA (cerebral vascular accident) (HCC)    CVA (cerebrovascular accident) (HCC)    L MCA   GERD (gastroesophageal reflux disease)    High cholesterol    Hypertension    Osteopenia    Palpitations     PAST SURGICAL HISTORY: Past Surgical History:  Procedure Laterality Date   CESAREAN SECTION      ALLERGIES: No Known Allergies  FAMILY HISTORY: Family History  Problem Relation Age of Onset   Hypertension Mother    Osteoporosis Mother    Breast cancer Neg Hx     SOCIAL HISTORY: Social History   Tobacco Use   Smoking status: Former   Smokeless tobacco: Former    Quit date: 04/09/1978  Vaping Use   Vaping status: Never Used  Substance Use Topics   Alcohol use: No   Drug use: No   Social History   Social History Narrative   Are you right handed or left handed? Right   Are you currently employed ? no   What is your current occupation?retired   Do you live at home alone? No    Who lives with you? husband   What type of home do you live in: 1 story or 2 story? one    Caffeine 1 soda a day     Objective:  Vital Signs:   138/80 -- -- --   Orthostatic BP -- 148/84 148/86 146/86  BP Location -- Right Arm Right Arm --  Patient Position -- Supine Sitting --  Cuff Size -- Normal Normal Normal  Pulse Rate 107Pulse Rate. 107. Data is abnormal. Taken on 07/03/23 10:29 AM -- -- --  Orthostatic Pulse -- 95 94 94  Weight 172 lb (78 kg) -- -- --  Height 5\' 5"  (1.651 m) -- -- --  SpO2 98 % 97 % 96 % 98 %     General: General appearance: Awake and alert. No distress. Cooperative with exam.  Skin: No obvious rash or jaundice. HEENT: Atraumatic. Anicteric. Lungs: Non-labored breathing on room air  Heart: Regular Extremities: No edema.   Neurological: Mental Status: Alert. Speech fluent. No pseudobulbar affect Cranial Nerves: CNII: No RAPD. Visual fields intact. CNIII, IV, VI: PERRL. No nystagmus. EOMI. CN V: Facial sensation intact bilaterally to fine touch. CN VII: Facial muscles symmetric and strong. No ptosis at rest. CN VIII: Hears finger rub well bilaterally. CN IX: No hypophonia. CN X: Palate elevates symmetrically. CN XI: Full strength shoulder shrug bilaterally. CN XII: Tongue protrusion full and midline. No atrophy or fasciculations. No significant dysarthria Motor: Tone is increased in all extremities. No clear tremor seen. Strength is 5/5 in bilateral upper and lower extremities. Reflexes:  Right Left  Bicep 2+ 2+  Tricep 2+ 2+  BrRad 2+ 2+  Knee 2+ 2+  Ankle 2+ 2+   Pathological Reflexes: Hoffman: absent bilaterally Troemner: absent bilaterally Sensation: Pinprick: Intact in all extremities Vibration: Intact in all extremities Proprioception: Intact in bilateral great toes. Coordination: Intact finger-to- nose-finger bilaterally. Romberg negative. Finger tapping and toe tapping equivocal for decrement. Gait: Narrow based, slow, shuffling gait. En bloc turns. Unsteady.  Lab and Test Review: New results: HbA1c (04/16/23): 7.6 Lipid panel (04/16/23): tChol 161, LDL 95, TG 73  04/26/22: Zinc wnl Copper wnl Vit B6 wnl IFE: no M protein  MRI brain w/wo contrast (12/13/22): FINDINGS: Brain: Cerebral volume is within normal limits for age. No restricted diffusion to suggest acute infarction. No midline shift, mass effect, evidence of mass lesion, ventriculomegaly, extra-axial collection or acute intracranial hemorrhage. Cervicomedullary junction and pituitary  are within normal limits.   Incidental dural calcifications. Cortical encephalomalacia left middle frontal gyrus and frontal operculum (series 112, image 19 and series 118, image 18), left MCA territory. No chronic cerebral blood products on SWI. Scattered, patchy additional bilateral cerebral white matter T2 and FLAIR hyperintensity, moderate for age. Occasional T2 shine through associated on diffusion. Mild  T2 heterogeneity in the bilateral deep gray nuclei and pons. Cerebellum is spared. No abnormal enhancement identified. No dural thickening.   Vascular: Major intracranial vascular flow voids are preserved. Following contrast the major dural venous sinuses are enhancing and appear to be patent.   Skull and upper cervical spine: Negative for age visible cervical spine. Visualized bone marrow signal is within normal limits.   Sinuses/Orbits: Postoperative changes to both globes, otherwise negative orbits. Mild maxillary sinus circumferential mucosal thickening. Other paranasal sinuses and mastoids are well aerated. No sinus fluid levels.   Other: Visible internal auditory structures appear normal. Normal stylomastoid foramina. Negative visible scalp and face.   IMPRESSION: 1. No acute intracranial abnormality. 2. 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 Moderate for age cerebral white matter and mild for age other signal changes in the bilateral brain, compatible with chronic small vessel disease.  Carotid ultrasound (01/21/23): IMPRESSION: Color duplex indicates no significant plaque, with no hemodynamically significant stenosis by duplex criteria in the extracranial cerebrovascular circulation.  Previously reviewed results: Normal or unremarkable: CBC, CMP, folate B12 (03/07/22): 822 HbA1c (09/18/21): 6.6   Imaging: Lumbar xray (12/05/16): FINDINGS: The lumbar vertebral bodies are preserved in height. The disc space heights are well  maintained. There is mild facet joint hypertrophy at L4-5. There are no pars defects in there is no spondylolisthesis. The pedicles and transverse processes are intact. The observed portions of the sacrum are normal. The visualized portions of the SI joints appear normal for age. There is calcification in the wall of the abdominal aorta.   IMPRESSION: Mild degenerative facet joint change at L4-5. There is no compression fracture, spondylolisthesis, or high-grade disc space narrowing.   Abdominal aortic atherosclerosis.   Cervical spine xray (12/05/16): FINDINGS: Diffuse degenerative change. No acute bony or joint abnormality identified. No evidence of fracture dislocation. Pulmonary apices are clear .   IMPRESSION: Diffuse degenerative change.  No acute abnormality.  ASSESSMENT: This is YARELLI DECELLES, a 78 y.o. female with: Dizziness and imbalance - The etiology of these symptoms is currently unclear. There are elements that sound peripheral and vestibular raising the possibility of BPPV or other vestibular dysfunction. Of note, her orthostatic vitals are negative. However, patient's gait and freezing symptoms are concerning for parkinsonism. I wonder if the dizziness she feels is due to postural instability that is often felt in parkinsonism. We agreed to trial Sinemet for symptoms and monitor response. Tingling in bilateral feet - stable. Could contribute to imbalance. 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 Likely incidental and not causing her current symptoms. She also has chronic microvascular ischemia and atrophy.  Plan: -Will trial Sinemet 1/2 tablet 3 times per day -Could consider further work up such as DaTscan or ENT evaluation for vestibular testing if symptoms due not improve. -Continue home vestibular rehab exercises  For secondary stroke prevention: -Start aspirin 81 mg daily -Continue atorvastatin 80 mg daily  Return to  clinic in 1-2 months  Total time spent reviewing records, interview, history/exam, documentation, and coordination of care on day of encounter:  60 min  Jacquelyne Balint, MD

## 2023-07-03 NOTE — Patient Instructions (Signed)
 I saw you today for dizziness and imbalance. I am not sure of the cause but it could be that you are not moving as well as you should. I am concerned about parkinsonism, the most well known of which is Parkinson's disease, because of the way you are walking. Your dizziness could also be due to blood pressure drops or even an inner ear problem. This isn't completely clear yet.  I would like to trial a parkinsonism medication to see if this helps. You will take Sinemet 1/2 tablet 3 times per day.   Continue your home vestibular rehab exercises.  For stroke prevention, I want you to start taking a baby aspirin every day (81 mg) and continue atorvastatin 80 mg daily.  I want to see you back in clinic in 1-2 months to check your progress. Please let me know if you have any questions or concerns in the meantime.   The physicians and staff at [facility] are committed to providing excellent care. You may receive a survey requesting feedback about your experience at our office. We strive to receive "very good" responses to the survey questions. If you feel that your experience would prevent you from giving the office a "very good " response, please contact our office to try to remedy the situation. We may be reached at [phone]. Thank you for taking the time out of your busy day to complete the survey.  Jacquelyne Balint, MD [facility]  Preventing Falls at [facility] are common, often dreaded events in the lives of older people. Aside from the obvious injuries and even death that may result, fall can cause wide-ranging consequences including loss of independence, mental decline, decreased activity and mobility. Younger people are also at risk of falling, especially those with chronic illnesses and fatigue.  Ways to reduce risk for falling Examine diet and medications. Warm foods and alcohol dilate blood vessels, which can lead to dizziness when standing. Sleep aids, antidepressants and pain  medications can also increase the likelihood of a fall.  Get a vision exam. Poor vision, cataracts and glaucoma increase the chances of falling.  Check foot gear. Shoes should fit snugly and have a sturdy, nonskid sole and a broad, low heel  Participate in a physician-approved exercise program to build and maintain muscle strength and improve balance and coordination. Programs that use ankle weights or stretch bands are excellent for muscle-strengthening. Water aerobics programs and low-impact Tai Chi programs have also been shown to improve balance and coordination.  Increase vitamin D intake. Vitamin D improves muscle strength and increases the amount of calcium the body is able to absorb and deposit in bones.  How to prevent falls from common hazards Floors - Remove all loose wires, cords, and throw rugs. Minimize clutter. Make sure rugs are anchored and smooth. Keep furniture in its usual place.  Chairs -- Use chairs with straight backs, armrests and firm seats. Add firm cushions to existing pieces to add height.  Bathroom - Install grab bars and non-skid tape in the tub or shower. Use a bathtub transfer bench or a shower chair with a back support Use an elevated toilet seat and/or safety rails to assist standing from a low surface. Do not use towel racks or bathroom tissue holders to help you stand.  Lighting - Make sure halls, stairways, and entrances are well-lit. Install a night light in your bathroom or hallway. Make sure there is a light switch at the top and bottom of the staircase.  Turn lights on if you get up in the middle of the night. Make sure lamps or light switches are within reach of the bed if you have to get up during the night.  Kitchen - Install non-skid rubber mats near the sink and stove. Clean spills immediately. Store frequently used utensils, pots, pans between waist and eye level. This helps prevent reaching and bending. Sit when getting things out of lower  cupboards.  Living room/ Bedrooms - Place furniture with wide spaces in between, giving enough room to move around. Establish a route through the living room that gives you something to hold onto as you walk.  Stairs - Make sure treads, rails, and rugs are secure. Install a rail on both sides of the stairs. If stairs are a threat, it might be helpful to arrange most of your activities on the lower level to reduce the number of times you must climb the stairs.  Entrances and doorways - Install metal handles on the walls adjacent to the doorknobs of all doors to make it more secure as you travel through the doorway.  Tips for maintaining balance Keep at least one hand free at all times. Try using a backpack or fanny pack to hold things rather than carrying them in your hands. Never carry objects in both hands when walking as this interferes with keeping your balance.  Attempt to swing both arms from front to back while walking. This might require a conscious effort if Parkinson's disease has diminished your movement. It will, however, help you to maintain balance and posture, and reduce fatigue.  Consciously lift your feet off of the ground when walking. Shuffling and dragging of the feet is a common culprit in losing your balance.  When trying to navigate turns, use a "U" technique of facing forward and making a wide turn, rather than pivoting sharply.  Try to stand with your feet shoulder-length apart. When your feet are close together for any length of time, you increase your risk of losing your balance and falling.  Do one thing at a time. Don't try to walk and accomplish another task, such as reading or looking around. The decrease in your automatic reflexes complicates motor function, so the less distraction, the better.  Do not wear rubber or gripping soled shoes, they might "catch" on the floor and cause tripping.  Move slowly when changing positions. Use deliberate, concentrated  movements and, if needed, use a grab bar or walking aid. Count 15 seconds between each movement. For example, when rising from a seated position, wait 15 seconds after standing to begin walking.  If balance is a continuous problem, you might want to consider a walking aid such as a cane, walking stick, or walker. Once you've mastered walking with help, you might be ready to try it on your own again.  Guidelines for the Nonpharmacological Treatment of Orthostatic Hypotension/Orthostatic Intolerance  Make all postural changes from lying to sitting or sitting to standing, slowly.  Drink 2.0-2.5 L of fluid per day (if okay with your other doctors).  Increase sodium in the diet to 3-5 g per day (if okay with your other doctors).  Avoid large meals which can cause low blood pressure during digestion. It is better to eat smaller meals more often than 3 large meals.  Avoid alcohol. Alcohol can cause blood to pool in the legs which may worsen low blood pressure reactions when standing.  Perform lower extremity exercises to improve strength of the leg muscles.  This will help prevent blood from pooling in the legs when standing and walking. Raise the head of the bed by 6-10 inches. The entire bed must be at an angle. Raising only the head portion of the bed at the waist level or using pillows will not be effective. Raising the head of the bed will reduce urine formation overnight and there will be more volume in the circulation in the morning. It may also help orthostatic tolerance during the day.  During bad days or prior to engaging in more physical activity than usual, drink 500 ml of water quickly. This will result in increased blood pressure within 5 minutes of drinking the water. The effect will last up to a few hours and may improve orthostatic intolerance.  Use custom-fitted elastic support stockings. This will reduce a tendency for blood to pool in the legs when standing and may improve  orthostatic intolerance. Abdominal binder or "SPANX" may also be useful.  Use physical counter-maneuvers such as leg crossing, squatting, or raising and resting the leg on a chair. These maneuvers increase blood pressure and can improve orthostatic intolerance.  Gently escalated aerobic physical activity program for graded reconditioning (see details below)  Home-based Exercise Plan for Patients with Orthostatic Intolerance (as may be seen in POTS and related disorders)  Level 1 - Reclined Gentle Movements This is the starting point for those patients most severely disabled by an autonomic disorder. If you are bedridden, this is your first step. We have known dysautonomia patients who were bedridden for years, and we able to work their way from "barely able to move" to biking 45 minutes a day, everyday. It will be hard. It may make you feel worse in the beginning. But the human body was meant to move. Just take baby steps until you can make it to your goal. Some patients can only do one minute a day, or one minute at a time, a few times a day. And they do this every day for a week, and then they increase to two minutes a day the next week. This is a very slow process, but improving your health slowly is better than not improving it at all. Helpful exercises may include:  Leg Pillow Squeeze - while laying down or reclined in bed, put a pillow folded between your knees and squeeze. Hold it for 10 seconds. Repeat.  Arm Pillow Squeeze - put the pillow folded between your palms and squeeze together as though you were putting your hands into a praying position. Hold it for 10 seconds. Repeat.  Alphabet Toes - while laying in bed, write your name in the air with your toes. If you can build up your strength, write the whole alphabet. Do this several times a day.  Side Leg Lifts - while laying on your side, lift your leg up sideways and then bring your leg back down, without touching your legs together.  Repeat.  Front Leg Lifts - While laying on your back, life your left leg up, pointing your toe towards the ceiling. Repeat. Switch to right leg.  Gentle Stretching - any kind of stretching helps move blood around in the body and takes stress of your joints if you have been sitting or laying in the same position for a long time. Go through the entire body doing mild stretches, from feet, to legs, to back, to arms, to neck. Doing this when you wake up can be a great way to start the day, and repeating your  stretches before bed can help you relax and sleep better.  Level 2 - Recumbent Cardio Exercises This is probably the level that most patients will be able to begin with, although everyone can benefit from the gentle stretching and toning exercises described in Level 1.  Always begin your workout with 5-10 minutes of stretching and/or yoga to warm up your muscles and protect your joints from injury.  Since the point of these exercises is to get your cardiovascular system to be more efficient, you will want to set a target heart rate for your workout. You should speak to your doctor about this because medications and other medical conditions can impact your target heart rate, but most patients can tolerate a workout at 75% to 80% of their maximum heart rate. [facility] has a Target Heart Rate Calculator you can use as a guide when speaking with your doctor.  You may want to purchase an exercise heart rate monitor to wear during your workouts to help you keep your heart rate within your target zone. We do not endorse any specific products, but exercise heart rate monitors with chest straps are usually more accurate than pulse oximeters you place on your finger. This is especially so for dysautonomia patients who have abnormalities in peripheral blood flow (common in some forms of dysautonomia), as this is more likely to give an inaccurate reading using a finger based monitor.  Suggested reclined  cardiovascular exercises include:  Rowing - use a rowing machine, or if you are feeling well enough, a kayak. You may want to start out slow, maybe 2-5 minutes a day. At your own pace, adding a few minutes per week, try to work your way up to 45 minutes per day, 5 days a week, with 30 minutes of your routine done in your target heart rate zone. Be sure to warm up at the beginning and cool down at the end.  Recumbent Biking - recumbent exercise bikes are different than regular exercise bikes. They seat the rider in a reclined position, rather than upright. Try recumbent biking a few minutes a day, adding a few minutes each week, until you can work out 45 minutes a day, five days a week, with 30 minutes of that workout in your target heart rate zone. Be sure to warm up at the beginning and cool down at the end of each workout.  Swimming - The pressure from water helps prevent orthostatic symptoms. Dysautonomia patients who have been bedridden for years may be able to stand upright for an hour in a pool, because the pressure from the water prevents orthostatic symptoms from occurring, or lessens their impact. Dysautonomia patients can take advantage of this to get a good cardio workout, or to focus on stretching and strength training in the water. Always swim with a spotter or a buddy who can keep and eye on you, just in case you develop lightheadedness or other symptoms that would make it unsafe to be in a pool. It may be best to start your swimming exercise program at a pool with a lifeguard, or with a Physical Therapist who specializes in aquatic therapy. A good old fashioned kick board can be a great tool for dysautonomia patients. You can kick your way around the pool, which gives you a good cardio workout, and all that kicking helps strengthen your legs. Toning up your legs and core is a great way to minimize orthostatic symptoms.  Weight Training - Most dysautonomia patients can benefit from  overall  increase in tone and strength. The stronger our muscles are, the more efficiently they use oxygen, the better we will be able to tolerate orthostatic stress. Special emphasis can be placed on strengthening leg and core muscles. Some patients find it useful to wear 2 to 4 lb. weights that attach to the ankles with velcro. This can really help with leg strength if you wear them often enough. There are also machines at the gym that assist with core and leg weight training. Begin with light weights, and use them in a reclined or seated position. Some patients become extra symptomatic when lifting their arms over their head, so take extra care if attempting to do that.  Level 3 - Normal Workouts Some dysautonomia patients are able to jog, run marathons or walk several miles a week. These patients should do whatever they can to continue these activities. Dysautonomia patients who are well-conditioned should exercise 45 minutes a day, at least 3 days per week. Special emphasis should be placed on leg and core strength, and cardiovascular exercises.

## 2023-07-07 ENCOUNTER — Other Ambulatory Visit: Payer: Self-pay | Admitting: Family Medicine

## 2023-07-07 DIAGNOSIS — E1140 Type 2 diabetes mellitus with diabetic neuropathy, unspecified: Secondary | ICD-10-CM

## 2023-07-07 DIAGNOSIS — E7800 Pure hypercholesterolemia, unspecified: Secondary | ICD-10-CM

## 2023-07-09 ENCOUNTER — Telehealth: Payer: Self-pay

## 2023-07-09 NOTE — Telephone Encounter (Signed)
 Prescription Request  07/09/2023  LOV: 06/11/23  What is the name of the medication or equipment? Semaglutide,0.25 or 0.5MG /DOS, 2 MG/3ML ZOXW [[id number]]   Have you contacted your pharmacy to request a refill? Yes   Which pharmacy would you like this sent to?  [id number] - [place] ([place]), [place] - [address] [place] [address] Phone: [phone] Fax: [phone]    Patient notified that their request is being sent to the clinical staff for review and that they should receive a response within 2 business days.   Please advise at [facility] [phone]

## 2023-07-09 NOTE — Telephone Encounter (Signed)
 Requested Prescriptions  Pending Prescriptions Disp Refills   OZEMPIC, 0.25 OR 0.5 MG/DOSE, 2 MG/3ML SOPN [Pharmacy Med Name: Ozempic (0.25 or 0.5 MG/DOSE) 2 MG/3ML Subcutaneous Solution Pen-injector] 3 mL 0    Sig: INJECT 0.5 MG INTO THE SKIN ONCE A WEEK     Endocrinology:  Diabetes - GLP-1 Receptor Agonists - semaglutide Failed - 07/09/2023  3:58 PM      Failed - HBA1C in normal range and within 180 days    Hgb A1c MFr Bld  Date Value Ref Range Status  04/16/2023 7.6 (H) <5.7 % of total Hgb Final    Comment:    For someone without known diabetes, a hemoglobin A1c value of 6.5% or greater indicates that they may have  diabetes and this should be confirmed with a follow-up  test. . For someone with known diabetes, a value <7% indicates  that their diabetes is well controlled and a value  greater than or equal to 7% indicates suboptimal  control. A1c targets should be individualized based on  duration of diabetes, age, comorbid conditions, and  other considerations. . Currently, no consensus exists regarding use of hemoglobin A1c for diagnosis of diabetes for children. .          Failed - Cr in normal range and within 360 days    Creat  Date Value Ref Range Status  06/24/2023 1.03 (H) 0.60 - 1.00 mg/dL Final   Creatinine, Urine  Date Value Ref Range Status  04/16/2023 182 20 - 275 mg/dL Final  16/01/9603 540 20 - 275 mg/dL Final         Passed - Valid encounter within last 6 months    Recent Outpatient Visits           2 weeks ago Dizziness   [place] [facility] [place] Meo, FNP   4 weeks ago Neuropathy   [place] [facility] Donita Brooks, MD   2 months ago Controlled type 2 diabetes mellitus with neuropathy [facility])   [place] [facility] Donita Brooks, MD   8 months ago Hematuria, unspecified type   [place] [facility] Donita Brooks, MD   8 months ago Epigastric pain    [place] [facility] Pickard, Priscille Heidelberg, MD

## 2023-07-09 NOTE — Telephone Encounter (Signed)
 Medication sent in today in a separate refill encounter.

## 2023-07-15 ENCOUNTER — Telehealth: Payer: Self-pay

## 2023-07-15 NOTE — Telephone Encounter (Signed)
 Copied from CRM [id number]. Topic: Clinical - Prescription Issue >> Jul 15, 2023 12:42 PM Elle L wrote: Reason for CRM: Samuel Bouche with [employer] called on behalf of the patient as she needs a prior authorization for Ozempic. Their call back number is [phone] Option 2 if needed.

## 2023-07-17 ENCOUNTER — Other Ambulatory Visit: Payer: Self-pay

## 2023-07-17 NOTE — Telephone Encounter (Signed)
 Information regarding your request There is a previously denied request on file, please contact [facility] for further assistance. APPEAL SENT TO [facility]. MJP,LPN

## 2023-07-22 ENCOUNTER — Telehealth: Payer: Self-pay

## 2023-07-23 ENCOUNTER — Telehealth: Payer: Self-pay | Admitting: Family Medicine

## 2023-07-23 ENCOUNTER — Telehealth: Payer: Self-pay

## 2023-07-23 NOTE — Telephone Encounter (Signed)
 Copied from CRM [id number]. Topic: General - Other >> Jul 22, 2023  3:00 PM Antwanette L wrote: Reason for CRM: Patient wants to know if Dr.Pickard can sign patient up for the Diabetic Wellness Program.Patient can be contacted by phone at [phone]

## 2023-07-23 NOTE — Telephone Encounter (Signed)
 Copied from CRM [id number]. Topic: General - Other >> Jul 22, 2023  3:00 PM Antwanette L wrote: Reason for CRM: Patient wants to know if Dr.Pickard can sign patient up for the Diabetic Wellness Program.Patient can be contacted by phone at [phone] >> Jul 23, 2023 10:56 AM Bridgette Campus B wrote: Routed telephone encounter to provider's nurse.

## 2023-07-26 NOTE — Telephone Encounter (Signed)
 Nina Olson is calling back from [employer] to speak to Nina Olson.  Patient is want to sign up for the Diabetic Program. This program is through [facility]. And Nina Olson can send a referral to the health coach. CB- - [phone]

## 2023-08-05 NOTE — Progress Notes (Deleted)
 NEUROLOGY FOLLOW UP OFFICE NOTE  Nina Olson [id number]  Subjective:  Nina Olson is a 78 y.o. year old female with a history of DM, HTN, HLD, GERD, former smoker who we last saw on 07/03/23 for tingling in feet and dizziness.  To briefly review: Initial consult 04/26/22: Symptoms started about 2 years ago (2022) in bilateral feet with tingling. It would feel like she was "walking on cardboard." She saw podiatry who felt she may have a neuroma as she was having particular pain in right great toe. She got an injection that helped a little, but then symptoms returned. Over the last 2 months, her feet have felt like they are tightening (left > right). She denies significant cramps. She sometimes feels off balance, but denies any falls. She endorses back pain and shooting pains from back into buttocks into legs (calling it sciatica). It is also an achy sensation. If she moves in certain ways, it can "grab" her and make it painful to move in certain way. She has never been given medication for her symptoms. She uses over the counter icy hot like things that help with the back. She has never used anything on her feet.   She does not have significant symptoms in her hands. She will occasionally wake up with a hand tingling. Of note, patient broke her left hand 25 years ago and has some residual deformity.   Patient also mentions that her right knee has been hurting for about a month and that she has swelling on the medial aspect of her right knee.   Of note, patient is on B complex and zinc  for 2-3 years.   Podiatry had mentioned EMG to the patient, but she has not had this or scheduled it at this point.   She does not report any constitutional symptoms like fever, night sweats, anorexia or unintentional weight loss.   EtOH use: No, has not drank much since 1980s Restrictive diet? No Family history of neuropathy/myopathy/NM disease? Mother has pain in legs too (?neuropathy)  07/03/23: She has  had worsening dizziness since early 06/2023 associated with loss of balance. The dizziness has been present for about a year. She first mentioned to her PCP in 10/2022 by my chart review. BP was low that day so hydrochlorothiazide  was stopped. This did not seem to affect symptoms. 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   She describes the "dizziness" as a boat rocking sensation and lightheadedness. It started intermittent and is now constant. There is no room spinning. Bending over or turning too fast will provoke her symptoms. She feels her walking is off. She denies any recent falls. She denies any significant hearing problems.    She denies problems with smell but food sometimes tastes funny. She denies any REM sleep behavior. She does freeze when trying to move sometimes. She also sometimes notices a tremor at rest.   Patient completed vestibular rehab but continued to have symptoms. She is not sure if this helped. She did not go long though because she felt she could do similar things at home. She has not kept on this though. She was sent to PT sciatica. She was prescribed gabapentin  300 mg at bedtime for pain in 2024, but she does not really take this often.   Patient is not on aspirin though she has been told to take. She is on atorvastatin  80 mg daily.  Her tingling in the feet is unchanged from prior.   Patient saw dermatology and was told she had eczema and she got cream for it.  Most recent Assessment and Plan (07/03/23): This is Nina Olson, a 78 y.o. female with: Dizziness and imbalance - The etiology of these symptoms is currently unclear. There are elements that sound peripheral and vestibular raising the possibility of BPPV or other vestibular dysfunction. Of note, her orthostatic vitals are negative. However, patient's gait and freezing symptoms are concerning for parkinsonism. I wonder if  the dizziness she feels is due to postural instability that is often felt in parkinsonism. We agreed to trial Sinemet  for symptoms and monitor response. Tingling in bilateral feet - stable. Could contribute to imbalance. 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 Likely incidental and not causing her current symptoms. She also has chronic microvascular ischemia and atrophy.   Plan: -Will trial Sinemet  1/2 tablet 3 times per day -Could consider further work up such as DaTscan or ENT evaluation for vestibular testing if symptoms due not improve. -Continue home vestibular rehab exercises   For secondary stroke prevention: -Start aspirin 81 mg daily -Continue atorvastatin  80 mg daily  Since their last visit: ***  MEDICATIONS:  Outpatient Encounter Medications as of 08/16/2023  Medication Sig   Accu-Chek Softclix Lancets lancets USE TO CHECK CBG ONCE A DAY   atorvastatin  (LIPITOR) 80 MG tablet Take 1 tablet (80 mg total) by mouth daily.   blood glucose meter kit and supplies Dispense based on patient and insurance preference. Check CBG once a day (FOR ICD-10 E10.9, E11.9).   carbidopa -levodopa  (SINEMET  IR) 25-100 MG tablet Take 0.5 tablets by mouth 3 (three) times daily.   esomeprazole  (NEXIUM ) 40 MG capsule Take 1 capsule by mouth once daily   famotidine  (PEPCID ) 40 MG tablet Take 1 tablet by mouth once daily   gabapentin  (NEURONTIN ) 300 MG capsule Take 1 capsule (300 mg total) by mouth at bedtime as needed (nerve pain in feet).   glucose blood (ACCU-CHEK GUIDE) test strip USE TO CHECK CBG ONCE A DAY   hydrochlorothiazide  (HYDRODIURIL ) 25 MG tablet Take 0.5 tablets (12.5 mg total) by mouth daily. Pt reports she takes half of 25 mg tablet   losartan  (COZAAR ) 100 MG tablet Take 1 tablet (100 mg total) by mouth daily.   Multiple Vitamin (MULTI-VITAMIN) tablet Take by mouth.   Omega-3 Fatty Acids (FISH OIL PO) Take by mouth as directed.   OZEMPIC , 0.25 OR  0.5 MG/DOSE, 2 MG/3ML SOPN INJECT 0.5 MG INTO THE SKIN ONCE A WEEK   Probiotic Product (PROBIOTIC DAILY PO) Take by mouth.   sucralfate  (CARAFATE ) 1 g tablet TAKE 1 TABLET BY MOUTH 4 TIMES DAILY WITH  MEALS  AND  AT  BEDTIME. (Patient not taking: Reported on 07/03/2023)   traZODone  (DESYREL ) 50 MG tablet Take 0.5-1 tablets (25-50 mg total) by mouth at bedtime as needed for sleep. (Patient not taking: Reported on 07/03/2023)   TWINRIX 720-20 ELU-MCG/ML injection    valACYclovir  (VALTREX ) 1000 MG tablet Take 1 tablet (1,000 mg total) by mouth 2 (two) times daily. (Patient taking differently: Take 1,000 mg by mouth 2 (two) times daily. Takes as needed.)   No facility-administered encounter medications on file as of 08/16/2023.    PAST MEDICAL HISTORY: Past Medical History:  Diagnosis Date   Anemia    Arthritis    Colon polyps    CVA (cerebral vascular accident) (HCC)  CVA (cerebrovascular accident) (HCC)    L MCA   GERD (gastroesophageal reflux disease)    High cholesterol    Hypertension    Osteopenia    Palpitations     PAST SURGICAL HISTORY: Past Surgical History:  Procedure Laterality Date   CESAREAN SECTION      ALLERGIES: No Known Allergies  FAMILY HISTORY: Family History  Problem Relation Age of Onset   Hypertension Mother    Osteoporosis Mother    Breast cancer Neg Hx     SOCIAL HISTORY: Social History   Tobacco Use   Smoking status: Former   Smokeless tobacco: Former    Quit date: 04/09/1978  Vaping Use   Vaping status: Never Used  Substance Use Topics   Alcohol use: No   Drug use: No   Social History   Social History Narrative   Are you right handed or left handed? Right   Are you currently employed ? no   What is your current occupation?retired   Do you live at home alone? No    Who lives with you? husband   What type of home do you live in: 1 story or 2 story? one    Caffeine 1 soda a day       Objective:  Vital Signs:  There were no vitals  taken for this visit.  ***  Labs and Imaging review: No new results***  Previously reviewed results: HbA1c (04/16/23): 7.6 Lipid panel (04/16/23): tChol 161, LDL 95, TG 73   04/26/22: Zinc  wnl Copper  wnl Vit B6 wnl IFE: no M protein   Normal or unremarkable: CBC, CMP, folate B12 (03/07/22): 822 HbA1c (09/18/21): 6.6   Imaging: Lumbar xray (12/05/16): FINDINGS: The lumbar vertebral bodies are preserved in height. The disc space heights are well maintained. There is mild facet joint hypertrophy at L4-5. There are no pars defects in there is no spondylolisthesis. The pedicles and transverse processes are intact. The observed portions of the sacrum are normal. The visualized portions of the SI joints appear normal for age. There is calcification in the wall of the abdominal aorta.   IMPRESSION: Mild degenerative facet joint change at L4-5. There is no compression fracture, spondylolisthesis, or high-grade disc space narrowing.   Abdominal aortic atherosclerosis.   Cervical spine xray (12/05/16): FINDINGS: Diffuse degenerative change. No acute bony or joint abnormality identified. No evidence of fracture dislocation. Pulmonary apices are clear .   IMPRESSION: Diffuse degenerative change.  No acute abnormality.  MRI brain w/wo contrast (12/13/22): IMPRESSION: 1. No acute intracranial abnormality. 2. 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 Moderate for age cerebral white matter and mild for age other signal changes in the bilateral brain, compatible with chronic small vessel disease.   Carotid ultrasound (01/21/23): IMPRESSION: Color duplex indicates no significant plaque, with no hemodynamically significant stenosis by duplex criteria in the extracranial cerebrovascular circulation.  Assessment/Plan:  This is Nina Olson, a 78 y.o. female with: ***   Plan: ***  Return to clinic in ***  Total time spent reviewing records,  interview, history/exam, documentation, and coordination of care on day of encounter:  *** min  Rommie Coats, MD

## 2023-08-08 DIAGNOSIS — R1032 Left lower quadrant pain: Secondary | ICD-10-CM | POA: Diagnosis not present

## 2023-08-08 DIAGNOSIS — K5900 Constipation, unspecified: Secondary | ICD-10-CM | POA: Diagnosis not present

## 2023-08-08 DIAGNOSIS — Z860100 Personal history of colon polyps, unspecified: Secondary | ICD-10-CM | POA: Diagnosis not present

## 2023-08-08 DIAGNOSIS — E669 Obesity, unspecified: Secondary | ICD-10-CM | POA: Diagnosis not present

## 2023-08-08 DIAGNOSIS — R194 Change in bowel habit: Secondary | ICD-10-CM | POA: Diagnosis not present

## 2023-08-08 DIAGNOSIS — R140 Abdominal distension (gaseous): Secondary | ICD-10-CM | POA: Diagnosis not present

## 2023-08-11 ENCOUNTER — Other Ambulatory Visit: Payer: Self-pay | Admitting: Family Medicine

## 2023-08-11 DIAGNOSIS — E7800 Pure hypercholesterolemia, unspecified: Secondary | ICD-10-CM

## 2023-08-11 DIAGNOSIS — E1140 Type 2 diabetes mellitus with diabetic neuropathy, unspecified: Secondary | ICD-10-CM

## 2023-08-12 NOTE — Progress Notes (Unsigned)
 NEUROLOGY FOLLOW UP OFFICE NOTE  Nina Olson [id number]  Subjective:  Nina Olson is a 78 y.o. year old female with a history of DM, HTN, HLD, GERD, former smoker who we last saw on 07/03/23 for tingling in feet and dizziness.  To briefly review: Initial consult 04/26/22: Symptoms started about 2 years ago (2022) in bilateral feet with tingling. It would feel like she was "walking on cardboard." She saw podiatry who felt she may have a neuroma as she was having particular pain in right great toe. She got an injection that helped a little, but then symptoms returned. Over the last 2 months, her feet have felt like they are tightening (left > right). She denies significant cramps. She sometimes feels off balance, but denies any falls. She endorses back pain and shooting pains from back into buttocks into legs (calling it sciatica). It is also an achy sensation. If she moves in certain ways, it can "grab" her and make it painful to move in certain way. She has never been given medication for her symptoms. She uses over the counter icy hot like things that help with the back. She has never used anything on her feet.   She does not have significant symptoms in her hands. She will occasionally wake up with a hand tingling. Of note, patient broke her left hand 25 years ago and has some residual deformity.   Patient also mentions that her right knee has been hurting for about a month and that she has swelling on the medial aspect of her right knee.   Of note, patient is on B complex and zinc  for 2-3 years.   Podiatry had mentioned EMG to the patient, but she has not had this or scheduled it at this point.   She does not report any constitutional symptoms like fever, night sweats, anorexia or unintentional weight loss.   EtOH use: No, has not drank much since 1980s Restrictive diet? No Family history of neuropathy/myopathy/NM disease? Mother has pain in legs too (?neuropathy)  07/03/23: She has  had worsening dizziness since early 06/2023 associated with loss of balance. The dizziness has been present for about a year. She first mentioned to her PCP in 10/2022 by my chart review. BP was low that day so hydrochlorothiazide  was stopped. This did not seem to affect symptoms. 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   She describes the "dizziness" as a boat rocking sensation and lightheadedness. It started intermittent and is now constant. There is no room spinning. Bending over or turning too fast will provoke her symptoms. She feels her walking is off. She denies any recent falls. She denies any significant hearing problems.    She denies problems with smell but food sometimes tastes funny. She denies any REM sleep behavior. She does freeze when trying to move sometimes. She also sometimes notices a tremor at rest.   Patient completed vestibular rehab but continued to have symptoms. She is not sure if this helped. She did not go long though because she felt she could do similar things at home. She has not kept on this though. She was sent to PT sciatica. She was prescribed gabapentin  300 mg at bedtime for pain in 2024, but she does not really take this often.   Patient is not on aspirin though she has been told to take. She is on atorvastatin  80 mg daily.  Her tingling in the feet is unchanged from prior.   Patient saw dermatology and was told she had eczema and she got cream for it.  Most recent Assessment and Plan (07/03/23): This is Nina Olson, a 78 y.o. female with: Dizziness and imbalance - The etiology of these symptoms is currently unclear. There are elements that sound peripheral and vestibular raising the possibility of BPPV or other vestibular dysfunction. Of note, her orthostatic vitals are negative. However, patient's gait and freezing symptoms are concerning for parkinsonism. I wonder if  the dizziness she feels is due to postural instability that is often felt in parkinsonism. We agreed to trial Sinemet  for symptoms and monitor response. Tingling in bilateral feet - stable. Could contribute to imbalance. 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 Likely incidental and not causing her current symptoms. She also has chronic microvascular ischemia and atrophy.   Plan: -Will trial Sinemet  1/2 tablet 3 times per day -Could consider further work up such as DaTscan or ENT evaluation for vestibular testing if symptoms due not improve. -Continue home vestibular rehab exercises   For secondary stroke prevention: -Start aspirin 81 mg daily -Continue atorvastatin  80 mg daily  Since their last visit: Patient has been taking Sinemet  1/2 tablet TID. She thinks this medication is making her more dizzy. She thinks walking may be a little better. That comes and goes though.   She continues to take aspirin 81 mg daily and atorvastatin  80 mg daily.   She has occasional stabbing pain in feet and fingers and sometimes in the left elbow.  MEDICATIONS:  Outpatient Encounter Medications as of 08/14/2023  Medication Sig   Accu-Chek Softclix Lancets lancets USE TO CHECK CBG ONCE A DAY   atorvastatin  (LIPITOR) 80 MG tablet Take 1 tablet (80 mg total) by mouth daily.   blood glucose meter kit and supplies Dispense based on patient and insurance preference. Check CBG once a day (FOR ICD-10 E10.9, E11.9).   carbidopa -levodopa  (SINEMET  IR) 25-100 MG tablet Take 0.5 tablets by mouth 3 (three) times daily.   esomeprazole  (NEXIUM ) 40 MG capsule Take 1 capsule by mouth once daily   famotidine  (PEPCID ) 40 MG tablet Take 1 tablet by mouth once daily   gabapentin  (NEURONTIN ) 300 MG capsule Take 1 capsule (300 mg total) by mouth at bedtime as needed (nerve pain in feet).   glucose blood (ACCU-CHEK GUIDE) test strip USE TO CHECK CBG ONCE A DAY   hydrochlorothiazide   (HYDRODIURIL ) 25 MG tablet Take 0.5 tablets (12.5 mg total) by mouth daily. Pt reports she takes half of 25 mg tablet   losartan  (COZAAR ) 100 MG tablet Take 1 tablet (100 mg total) by mouth daily.   Multiple Vitamin (MULTI-VITAMIN) tablet Take by mouth.   Omega-3 Fatty Acids (FISH OIL PO) Take by mouth as directed.   OZEMPIC , 0.25 OR 0.5 MG/DOSE, 2 MG/3ML SOPN INJECT 0.5MG   SUBCUTANEOUSLY ONCE A WEEK   Probiotic Product (PROBIOTIC DAILY PO) Take by mouth.   TWINRIX 720-20 ELU-MCG/ML injection    valACYclovir  (VALTREX ) 1000 MG tablet Take 1 tablet (1,000 mg total) by mouth 2 (two) times daily. (Patient taking differently: Take 1,000 mg by mouth 2 (two) times daily. Takes as needed.)   sucralfate  (CARAFATE ) 1 g tablet TAKE 1 TABLET BY MOUTH 4 TIMES DAILY WITH  MEALS  AND  AT  BEDTIME. (Patient not taking: Reported on 08/14/2023)   traZODone  (DESYREL ) 50 MG tablet Take 0.5-1 tablets (25-50 mg total)  by mouth at bedtime as needed for sleep. (Patient not taking: Reported on 08/14/2023)   [DISCONTINUED] OZEMPIC , 0.25 OR 0.5 MG/DOSE, 2 MG/3ML SOPN INJECT 0.5 MG INTO THE SKIN ONCE A WEEK   No facility-administered encounter medications on file as of 08/14/2023.    PAST MEDICAL HISTORY: Past Medical History:  Diagnosis Date   Anemia    Arthritis    Colon polyps    CVA (cerebral vascular accident) (HCC)    CVA (cerebrovascular accident) (HCC)    L MCA   GERD (gastroesophageal reflux disease)    High cholesterol    Hypertension    Osteopenia    Palpitations     PAST SURGICAL HISTORY: Past Surgical History:  Procedure Laterality Date   CESAREAN SECTION      ALLERGIES: No Known Allergies  FAMILY HISTORY: Family History  Problem Relation Age of Onset   Hypertension Mother    Osteoporosis Mother    Breast cancer Neg Hx     SOCIAL HISTORY: Social History   Tobacco Use   Smoking status: Former   Smokeless tobacco: Former    Quit date: 04/09/1978  Vaping Use   Vaping status: Never Used   Substance Use Topics   Alcohol use: No   Drug use: No   Social History   Social History Narrative   Are you right handed or left handed? Right   Are you currently employed ? no   What is your current occupation?retired   Do you live at home alone? No    Who lives with you? husband   What type of home do you live in: 1 story or 2 story? one    Caffeine 1 soda a day       Objective:  Vital Signs:  BP 136/68   Pulse 97   Ht 5\' 5"  (1.651 m)   Wt 173 lb (78.5 kg)   SpO2 100%   BMI 28.79 kg/m   General: No acute distress.  Patient appears well-groomed.   Head:  Normocephalic/atraumatic Neck: supple Back: No paraspinal tenderness Heart: regular rate and rhythm Lungs: Clear to auscultation bilaterally. Vascular: No carotid bruits.  Neurological Exam: Mental status: alert and oriented, speech fluent and not dysarthric, language intact.  Cranial nerves: CN I: not tested CN II: pupils equal, round and reactive to light, visual fields intact CN III, IV, VI:  full range of motion, no nystagmus, no ptosis CN V: facial sensation intact. CN VII: upper and lower face symmetric CN VIII: hearing intact CN IX, X: uvula midline CN XI: sternocleidomastoid and trapezius muscles intact CN XII: tongue midline  Bulk & Tone: normal, no fasciculations. Motor:  muscle strength 5/5 throughout Deep Tendon Reflexes:  2+ throughout.   Sensation:  Light touch sensation intact. Finger to nose testing:  Without dysmetria.   Heel to shin:  Without dysmetria.   Gait:  Normal station and stride.  Romberg negative.  HINTS exam: End gaze horizontal nystagmus. Subtle correction with head impulse. Dizziness worse with head impulse testing. No skew deviation.  Labs and Imaging review: No new results  Previously reviewed results: HbA1c (04/16/23): 7.6 Lipid panel (04/16/23): tChol 161, LDL 95, TG 73   04/26/22: Zinc  wnl Copper  wnl Vit B6 wnl IFE: no M protein   Normal or unremarkable: CBC,  CMP, folate B12 (03/07/22): 822 HbA1c (09/18/21): 6.6   Imaging: Lumbar xray (12/05/16): FINDINGS: The lumbar vertebral bodies are preserved in height. The disc space heights are well maintained. There is mild facet  joint hypertrophy at L4-5. There are no pars defects in there is no spondylolisthesis. The pedicles and transverse processes are intact. The observed portions of the sacrum are normal. The visualized portions of the SI joints appear normal for age. There is calcification in the wall of the abdominal aorta.   IMPRESSION: Mild degenerative facet joint change at L4-5. There is no compression fracture, spondylolisthesis, or high-grade disc space narrowing.   Abdominal aortic atherosclerosis.   Cervical spine xray (12/05/16): FINDINGS: Diffuse degenerative change. No acute bony or joint abnormality identified. No evidence of fracture dislocation. Pulmonary apices are clear .   IMPRESSION: Diffuse degenerative change.  No acute abnormality.  MRI brain w/wo contrast (12/13/22): IMPRESSION: 1. No acute intracranial abnormality. 2. 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 Moderate for age cerebral white matter and mild for age other signal changes in the bilateral brain, compatible with chronic small vessel disease.   Carotid ultrasound (01/21/23): IMPRESSION: Color duplex indicates no significant plaque, with no hemodynamically significant stenosis by duplex criteria in the extracranial cerebrovascular circulation.  Assessment/Plan:  This is Nina Olson, a 78 y.o. female with: Dizziness and imbalance - The etiology of these symptoms is currently unclear. There are elements that sound peripheral and vestibular raising the possibility of BPPV or other vestibular dysfunction. Of note, her orthostatic vitals are negative. However, patient's gait and freezing symptoms are concerning for parkinsonism. Her dizziness is worse when she stands, but  is also present at rest (sitting). I think there may be a peripheral component in addition to orthostatic intolerance. She thinks Sinemet  may have helped with walking but maybe made dizziness worse. She would like to continue medication for now though. Tingling in bilateral feet - stable. Could contribute to imbalance. 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 Likely incidental and not causing her current symptoms. She also has chronic microvascular ischemia and atrophy.   Plan: -Patient would like to continue Sinemet  1/2 tablet TID -Referral to ENT for dizziness as I am concerned this may be inner ear. Patient may need vestibular testing -Continue home vestibular therapy exercises -Fall precautions discussed  For secondary stroke prevention: -Continue aspirin 81 mg daily -Continue atorvastatin  80 mg daily  For neuropathy: -Continue gabapentin  as needed -Continue alpha lipoic acid 600 mg once or twice a day  Return to clinic in 6 months   Total time spent reviewing records, interview, history/exam, documentation, and coordination of care on day of encounter:  30 min  Rommie Coats, MD

## 2023-08-13 NOTE — Telephone Encounter (Signed)
 Requested Prescriptions  Pending Prescriptions Disp Refills   OZEMPIC , 0.25 OR 0.5 MG/DOSE, 2 MG/3ML SOPN [Pharmacy Med Name: Ozempic  (0.25 or 0.5 MG/DOSE) 2 MG/3ML Subcutaneous Solution Pen-injector] 3 mL 0    Sig: INJECT 0.5MG   SUBCUTANEOUSLY ONCE A WEEK     Endocrinology:  Diabetes - GLP-1 Receptor Agonists - semaglutide  Failed - 08/13/2023  2:09 PM      Failed - HBA1C in normal range and within 180 days    Hgb A1c MFr Bld  Date Value Ref Range Status  04/16/2023 7.6 (H) <5.7 % of total Hgb Final    Comment:    For someone without known diabetes, a hemoglobin A1c value of 6.5% or greater indicates that they may have  diabetes and this should be confirmed with a follow-up  test. . For someone with known diabetes, a value <7% indicates  that their diabetes is well controlled and a value  greater than or equal to 7% indicates suboptimal  control. A1c targets should be individualized based on  duration of diabetes, age, comorbid conditions, and  other considerations. . Currently, no consensus exists regarding use of hemoglobin A1c for diagnosis of diabetes for children. .          Failed - Cr in normal range and within 360 days    Creat  Date Value Ref Range Status  06/24/2023 1.03 (H) 0.60 - 1.00 mg/dL Final   Creatinine, Urine  Date Value Ref Range Status  04/16/2023 182 20 - 275 mg/dL Final  65/78/4696 295 20 - 275 mg/dL Final         Passed - Valid encounter within last 6 months    Recent Outpatient Visits           1 month ago Dizziness   [place] [facility] Jenelle Mis, FNP   2 months ago Neuropathy   [place] [facility] Austine Lefort, MD   3 months ago Controlled type 2 diabetes mellitus with neuropathy [facility])   [place] [facility] Austine Lefort, MD   9 months ago Hematuria, unspecified type   [place] [facility] Pickard, Cisco Crest, MD   9 months ago Epigastric pain    [place] [facility] Pickard, Cisco Crest, MD

## 2023-08-14 ENCOUNTER — Ambulatory Visit (INDEPENDENT_AMBULATORY_CARE_PROVIDER_SITE_OTHER): Admitting: Neurology

## 2023-08-14 ENCOUNTER — Encounter: Payer: Self-pay | Admitting: Neurology

## 2023-08-14 VITALS — BP 136/68 | HR 97 | Ht 65.0 in | Wt 173.0 lb

## 2023-08-14 DIAGNOSIS — R200 Anesthesia of skin: Secondary | ICD-10-CM | POA: Diagnosis not present

## 2023-08-14 DIAGNOSIS — R2689 Other abnormalities of gait and mobility: Secondary | ICD-10-CM

## 2023-08-14 DIAGNOSIS — G20C Parkinsonism, unspecified: Secondary | ICD-10-CM

## 2023-08-14 DIAGNOSIS — I639 Cerebral infarction, unspecified: Secondary | ICD-10-CM | POA: Diagnosis not present

## 2023-08-14 DIAGNOSIS — R202 Paresthesia of skin: Secondary | ICD-10-CM | POA: Diagnosis not present

## 2023-08-14 DIAGNOSIS — R42 Dizziness and giddiness: Secondary | ICD-10-CM | POA: Diagnosis not present

## 2023-08-14 NOTE — Patient Instructions (Addendum)
 Continue Sinemet  1/2 tablet three times daily for now.  I am referring you to ENT for the dizziness. I am not sure if this is an inner ear problem or not. They can help evaluate this since this is not improving.  Continue home exercises given by vestibular rehab.  For secondary stroke prevention: -Continue aspirin 81 mg daily -Continue atorvastatin  80 mg daily  For neuropathy: -Continue gabapentin  as needed -Continue alpha lipoic acid 600 mg once or twice a day  Return to clinic in 6 months  Please let me know if you have any questions or concerns in the meantime.  The physicians and staff at [facility] are committed to providing excellent care. You may receive a survey requesting feedback about your experience at our office. We strive to receive "very good" responses to the survey questions. If you feel that your experience would prevent you from giving the office a "very good " response, please contact our office to try to remedy the situation. We may be reached at [phone]. Thank you for taking the time out of your busy day to complete the survey.  Rommie Coats, MD [facility]   Preventing Falls at [facility] are common, often dreaded events in the lives of older people. Aside from the obvious injuries and even death that may result, fall can cause wide-ranging consequences including loss of independence, mental decline, decreased activity and mobility. Younger people are also at risk of falling, especially those with chronic illnesses and fatigue.  Ways to reduce risk for falling Examine diet and medications. Warm foods and alcohol dilate blood vessels, which can lead to dizziness when standing. Sleep aids, antidepressants and pain medications can also increase the likelihood of a fall.  Get a vision exam. Poor vision, cataracts and glaucoma increase the chances of falling.  Check foot gear. Shoes should fit snugly and have a sturdy, nonskid sole and a broad, low  heel  Participate in a physician-approved exercise program to build and maintain muscle strength and improve balance and coordination. Programs that use ankle weights or stretch bands are excellent for muscle-strengthening. Water aerobics programs and low-impact Tai Chi programs have also been shown to improve balance and coordination.  Increase vitamin D intake. Vitamin D improves muscle strength and increases the amount of calcium  the body is able to absorb and deposit in bones.  How to prevent falls from common hazards Floors - Remove all loose wires, cords, and throw rugs. Minimize clutter. Make sure rugs are anchored and smooth. Keep furniture in its usual place.  Chairs -- Use chairs with straight backs, armrests and firm seats. Add firm cushions to existing pieces to add height.  Bathroom - Install grab bars and non-skid tape in the tub or shower. Use a bathtub transfer bench or a shower chair with a back support Use an elevated toilet seat and/or safety rails to assist standing from a low surface. Do not use towel racks or bathroom tissue holders to help you stand.  Lighting - Make sure halls, stairways, and entrances are well-lit. Install a night light in your bathroom or hallway. Make sure there is a light switch at the top and bottom of the staircase. Turn lights on if you get up in the middle of the night. Make sure lamps or light switches are within reach of the bed if you have to get up during the night.  Kitchen - Install non-skid rubber mats near the sink and stove. Clean spills immediately. Store frequently  used utensils, pots, pans between waist and eye level. This helps prevent reaching and bending. Sit when getting things out of lower cupboards.  Living room/ Bedrooms - Place furniture with wide spaces in between, giving enough room to move around. Establish a route through the living room that gives you something to hold onto as you walk.  Stairs - Make sure treads, rails, and  rugs are secure. Install a rail on both sides of the stairs. If stairs are a threat, it might be helpful to arrange most of your activities on the lower level to reduce the number of times you must climb the stairs.  Entrances and doorways - Install metal handles on the walls adjacent to the doorknobs of all doors to make it more secure as you travel through the doorway.  Tips for maintaining balance Keep at least one hand free at all times. Try using a backpack or fanny pack to hold things rather than carrying them in your hands. Never carry objects in both hands when walking as this interferes with keeping your balance.  Attempt to swing both arms from front to back while walking. This might require a conscious effort if Parkinson's disease has diminished your movement. It will, however, help you to maintain balance and posture, and reduce fatigue.  Consciously lift your feet off of the ground when walking. Shuffling and dragging of the feet is a common culprit in losing your balance.  When trying to navigate turns, use a "U" technique of facing forward and making a wide turn, rather than pivoting sharply.  Try to stand with your feet shoulder-length apart. When your feet are close together for any length of time, you increase your risk of losing your balance and falling.  Do one thing at a time. Don't try to walk and accomplish another task, such as reading or looking around. The decrease in your automatic reflexes complicates motor function, so the less distraction, the better.  Do not wear rubber or gripping soled shoes, they might "catch" on the floor and cause tripping.  Move slowly when changing positions. Use deliberate, concentrated movements and, if needed, use a grab bar or walking aid. Count 15 seconds between each movement. For example, when rising from a seated position, wait 15 seconds after standing to begin walking.  If balance is a continuous problem, you might want to  consider a walking aid such as a cane, walking stick, or walker. Once you've mastered walking with help, you might be ready to try it on your own again.

## 2023-08-16 ENCOUNTER — Ambulatory Visit: Admitting: Neurology

## 2023-08-29 ENCOUNTER — Ambulatory Visit (INDEPENDENT_AMBULATORY_CARE_PROVIDER_SITE_OTHER): Admitting: Family Medicine

## 2023-08-29 VITALS — BP 118/62 | HR 100 | Temp 97.9°F | Ht 65.0 in | Wt 172.0 lb

## 2023-08-29 DIAGNOSIS — E785 Hyperlipidemia, unspecified: Secondary | ICD-10-CM | POA: Diagnosis not present

## 2023-08-29 DIAGNOSIS — R7989 Other specified abnormal findings of blood chemistry: Secondary | ICD-10-CM

## 2023-08-29 NOTE — Progress Notes (Signed)
 Subjective:    Patient ID: Nina Olson, female    DOB: 1946-02-06, 78 y.o.   MRN: [id number]  Patient was recently told by her gastroenterologist that her TSH was low.  She is also seeing a neurologist who is diagnosed her with parkinsonism.  She is currently on Sinemet  which she believes may have helped her gait and her movement.  However she reports feeling very dizzy on the medication.  She denies any hallucinations or abdominal upset. Past Medical History:  Diagnosis Date   Anemia    Arthritis    Colon polyps    CVA (cerebral vascular accident) (HCC)    CVA (cerebrovascular accident) (HCC)    L MCA   GERD (gastroesophageal reflux disease)    High cholesterol    Hypertension    Osteopenia    Palpitations    Past Surgical History:  Procedure Laterality Date   CESAREAN SECTION     Current Outpatient Medications on File Prior to Visit  Medication Sig Dispense Refill   Accu-Chek Softclix Lancets lancets USE TO CHECK CBG ONCE A DAY 100 each 0   atorvastatin  (LIPITOR) 80 MG tablet Take 1 tablet (80 mg total) by mouth daily. 90 tablet 3   blood glucose meter kit and supplies Dispense based on patient and insurance preference. Check CBG once a day (FOR ICD-10 E10.9, E11.9). 1 each 0   carbidopa -levodopa  (SINEMET  IR) 25-100 MG tablet Take 0.5 tablets by mouth 3 (three) times daily. 45 tablet 5   esomeprazole  (NEXIUM ) 40 MG capsule Take 1 capsule by mouth once daily 90 capsule 1   famotidine  (PEPCID ) 40 MG tablet Take 1 tablet by mouth once daily 90 tablet 1   gabapentin  (NEURONTIN ) 300 MG capsule Take 1 capsule (300 mg total) by mouth at bedtime as needed (nerve pain in feet). 30 capsule 3   glucose blood (ACCU-CHEK GUIDE) test strip USE TO CHECK CBG ONCE A DAY 100 each 6   hydrochlorothiazide  (HYDRODIURIL ) 25 MG tablet Take 0.5 tablets (12.5 mg total) by mouth daily. Pt reports she takes half of 25 mg tablet 90 tablet 3   losartan  (COZAAR ) 100 MG tablet Take 1 tablet (100 mg total)  by mouth daily. 90 tablet 1   Multiple Vitamin (MULTI-VITAMIN) tablet Take by mouth.     Omega-3 Fatty Acids (FISH OIL PO) Take by mouth as directed.     OZEMPIC , 0.25 OR 0.5 MG/DOSE, 2 MG/3ML SOPN INJECT 0.5MG   SUBCUTANEOUSLY ONCE A WEEK 3 mL 0   Probiotic Product (PROBIOTIC DAILY PO) Take by mouth.     sucralfate  (CARAFATE ) 1 g tablet TAKE 1 TABLET BY MOUTH 4 TIMES DAILY WITH  MEALS  AND  AT  BEDTIME. (Patient not taking: Reported on 08/14/2023) 120 tablet 0   traZODone  (DESYREL ) 50 MG tablet Take 0.5-1 tablets (25-50 mg total) by mouth at bedtime as needed for sleep. (Patient not taking: Reported on 08/14/2023) 90 tablet 1   TWINRIX 720-20 ELU-MCG/ML injection      valACYclovir  (VALTREX ) 1000 MG tablet Take 1 tablet (1,000 mg total) by mouth 2 (two) times daily. (Patient taking differently: Take 1,000 mg by mouth 2 (two) times daily. Takes as needed.) 14 tablet 5   No current facility-administered medications on file prior to visit.   No Known Allergies Social History   Socioeconomic History   Marital status: Married    Spouse name: Not on file   Number of children: 3   Years of education: Not on file  Highest education level: Not on file  Occupational History   Not on file  Tobacco Use   Smoking status: Former   Smokeless tobacco: Former    Quit date: 04/09/1978  Vaping Use   Vaping status: Never Used  Substance and Sexual Activity   Alcohol use: No   Drug use: No   Sexual activity: Never  Other Topics Concern   Not on file  Social History Narrative   Are you right handed or left handed? Right   Are you currently employed ? no   What is your current occupation?retired   Do you live at home alone? No    Who lives with you? husband   What type of home do you live in: 1 story or 2 story? one    Caffeine 1 soda a day    Social Drivers of Corporate investment banker Strain: Low Risk  (04/25/2023)   Overall Financial Resource Strain (CARDIA)    Difficulty of Paying Living  Expenses: Not hard at all  Food Insecurity: No Food Insecurity (04/25/2023)   Hunger Vital Sign    Worried About Running Out of Food in the Last Year: Never true    Ran Out of Food in the Last Year: Never true  Transportation Needs: No Transportation Needs (04/25/2023)   PRAPARE - Administrator, Civil Service (Medical): No    Lack of Transportation (Non-Medical): No  Physical Activity: Inactive (04/25/2023)   Exercise Vital Sign    Days of Exercise per Week: 0 days    Minutes of Exercise per Session: 0 min  Stress: No Stress Concern Present (04/25/2023)   [employer] of Occupational Health - Occupational Stress Questionnaire    Feeling of Stress : Not at all  Social Connections: Socially Integrated (04/25/2023)   Social Connection and Isolation Panel [NHANES]    Frequency of Communication with Friends and Family: Three times a week    Frequency of Social Gatherings with Friends and Family: Once a week    Attends Religious Services: More than 4 times per year    Active Member of [employer] or Organizations: Yes    Attends Engineer, structural: More than 4 times per year    Marital Status: Married  Catering manager Violence: Not At Risk (04/25/2023)   Humiliation, Afraid, Rape, and Kick questionnaire    Fear of Current or Ex-Partner: No    Emotionally Abused: No    Physically Abused: No    Sexually Abused: No     Review of Systems  All other systems reviewed and are negative.      Objective:   Physical Exam Constitutional:      Appearance: Normal appearance. She is normal weight.  Cardiovascular:     Rate and Rhythm: Normal rate and regular rhythm.     Pulses: Normal pulses.     Heart sounds: Normal heart sounds. No murmur heard.    No friction rub. No gallop.  Pulmonary:     Effort: Pulmonary effort is normal. No respiratory distress.     Breath sounds: Normal breath sounds. No stridor. No wheezing, rhonchi or rales.  Musculoskeletal:     Right lower  leg: No edema.     Left lower leg: No edema.  Neurological:     General: No focal deficit present.     Mental Status: She is alert and oriented to person, place, and time.          Assessment & Plan:  Abnormal TSH - Plan: T4, free, TSH, T3, Free, Thyroid Peroxidase Antibodies (TPO) (REFL)-[facility], Thyrotropin receptor autoabs check TSH, free T3, and free T4 to confirm doses of hyperthyroidism.  If the patient is hyperthyroid, check thyroid peroxidase antibodies to evaluate for Hashimoto's thyroiditis.  Check thyrotropin receptor antibodies to evaluate for Graves' disease.  If there is a question of the diagnosis, the patient may need a 24-hour radioactive iodine uptake scan to confirm the diagnosis.  This will help determine treatment options if necessary

## 2023-08-29 NOTE — Addendum Note (Signed)
 Addended by: Gillermo Lack K on: 08/29/2023 10:13 AM   Modules accepted: Orders

## 2023-08-30 ENCOUNTER — Ambulatory Visit: Payer: Self-pay | Admitting: Family Medicine

## 2023-08-30 LAB — TSH: TSH: 0.56 m[IU]/L (ref 0.40–4.50)

## 2023-08-30 LAB — EXTRA LAV TOP TUBE

## 2023-08-30 LAB — T3, FREE: T3, Free: 3 pg/mL (ref 2.3–4.2)

## 2023-08-30 LAB — T4, FREE: Free T4: 1 ng/dL (ref 0.8–1.8)

## 2023-08-30 LAB — THYROID PEROXIDASE ANTIBODIES (TPO) (REFL): Thyroperoxidase Ab SerPl-aCnc: <1 [IU]/mL (ref <9)

## 2023-09-04 ENCOUNTER — Telehealth: Payer: Self-pay | Admitting: Neurology

## 2023-09-04 NOTE — Telephone Encounter (Signed)
 Pt has questions about Rx, but did not specify which Rx

## 2023-09-05 DIAGNOSIS — K219 Gastro-esophageal reflux disease without esophagitis: Secondary | ICD-10-CM | POA: Diagnosis not present

## 2023-09-05 DIAGNOSIS — K5900 Constipation, unspecified: Secondary | ICD-10-CM | POA: Diagnosis not present

## 2023-09-05 DIAGNOSIS — Z860100 Personal history of colon polyps, unspecified: Secondary | ICD-10-CM | POA: Diagnosis not present

## 2023-09-05 DIAGNOSIS — K5730 Diverticulosis of large intestine without perforation or abscess without bleeding: Secondary | ICD-10-CM | POA: Diagnosis not present

## 2023-09-05 NOTE — Telephone Encounter (Signed)
 Called Pt back and she reported that

## 2023-09-06 ENCOUNTER — Other Ambulatory Visit: Payer: Self-pay

## 2023-09-06 ENCOUNTER — Other Ambulatory Visit: Payer: Self-pay | Admitting: Family Medicine

## 2023-09-06 DIAGNOSIS — E1140 Type 2 diabetes mellitus with diabetic neuropathy, unspecified: Secondary | ICD-10-CM

## 2023-09-06 DIAGNOSIS — E7800 Pure hypercholesterolemia, unspecified: Secondary | ICD-10-CM

## 2023-09-06 NOTE — Telephone Encounter (Signed)
 Pt. Needs assistance on side effects and if Rx Carbidopa -Levodopa  is need since she is not seeing ENT until later

## 2023-09-06 NOTE — Telephone Encounter (Signed)
 Called pt and repeated orders Dr Genita Keys answered earlier, and she was saying that It helps with balance and I told her to stop or take it at her on judgement . She ask if she could only take 2 tabs a day, Dr Genita Keys answer yes if it helps. She understood and will call back for concerns or questions.

## 2023-09-06 NOTE — Telephone Encounter (Signed)
 Called ENT office and they are backed up on referrals but they do have her referral. They said if pt wanted to call them she may get in earlier. I called pt and reported this, she understood and took number for office.

## 2023-09-07 NOTE — Telephone Encounter (Signed)
 Requested Prescriptions  Pending Prescriptions Disp Refills   Semaglutide ,0.25 or 0.5MG /DOS, (OZEMPIC , 0.25 OR 0.5 MG/DOSE,) 2 MG/3ML SOPN [Pharmacy Med Name: Ozempic  (0.25 or 0.5 MG/DOSE) 2 MG/3ML Subcutaneous Solution Pen-injector] 3 mL 2    Sig: INJECT 0.5MG   SUBCUTANEOUSLY ONCE A WEEK     Endocrinology:  Diabetes - GLP-1 Receptor Agonists - semaglutide  Failed - 09/07/2023  3:43 PM      Failed - HBA1C in normal range and within 180 days    Hgb A1c MFr Bld  Date Value Ref Range Status  04/16/2023 7.6 (H) <5.7 % of total Hgb Final    Comment:    For someone without known diabetes, a hemoglobin A1c value of 6.5% or greater indicates that they may have  diabetes and this should be confirmed with a follow-up  test. . For someone with known diabetes, a value <7% indicates  that their diabetes is well controlled and a value  greater than or equal to 7% indicates suboptimal  control. A1c targets should be individualized based on  duration of diabetes, age, comorbid conditions, and  other considerations. . Currently, no consensus exists regarding use of hemoglobin A1c for diagnosis of diabetes for children. .          Failed - Cr in normal range and within 360 days    Creat  Date Value Ref Range Status  06/24/2023 1.03 (H) 0.60 - 1.00 mg/dL Final   Creatinine, Urine  Date Value Ref Range Status  04/16/2023 182 20 - 275 mg/dL Final  47/82/9562 130 20 - 275 mg/dL Final         Passed - Valid encounter within last 6 months    Recent Outpatient Visits           1 week ago Abnormal TSH   [place] Pickard, Cisco Crest, MD   2 months ago Dizziness   [place] [facility] Jenelle Mis, FNP   2 months ago Neuropathy   [place] [facility] Austine Lefort, MD   4 months ago Controlled type 2 diabetes mellitus with neuropathy [facility])   [facility] Austine Lefort, MD   10 months  ago Hematuria, unspecified type    [facility] Pickard, Cisco Crest, MD

## 2023-09-09 ENCOUNTER — Telehealth: Payer: Self-pay

## 2023-09-09 NOTE — Telephone Encounter (Signed)
 Copied from CRM [id number]. Topic: Clinical - Prescription Issue >> Sep 09, 2023 12:29 PM Ethelle Herb L wrote: Reason for CRM: Pt states she received message from [facility] and was told rx for ozempic  would be $47. Pt states the ozempic  should be free. Pt states she spoke to [facility] and informed a prior authorization that was approved would have to be sent to [facility] so that pt can get rx at no cost.   Please assist further

## 2023-09-10 ENCOUNTER — Other Ambulatory Visit: Payer: Self-pay | Admitting: Family Medicine

## 2023-09-10 DIAGNOSIS — E1140 Type 2 diabetes mellitus with diabetic neuropathy, unspecified: Secondary | ICD-10-CM

## 2023-09-10 DIAGNOSIS — E7800 Pure hypercholesterolemia, unspecified: Secondary | ICD-10-CM

## 2023-09-10 MED ORDER — OZEMPIC (0.25 OR 0.5 MG/DOSE) 2 MG/3ML ~~LOC~~ SOPN: 0.5000 mg | PEN_INJECTOR | SUBCUTANEOUS | 11 refills | Status: AC

## 2023-09-11 ENCOUNTER — Other Ambulatory Visit: Payer: Self-pay

## 2023-09-11 ENCOUNTER — Other Ambulatory Visit: Payer: Self-pay | Admitting: Family Medicine

## 2023-09-11 DIAGNOSIS — E7800 Pure hypercholesterolemia, unspecified: Secondary | ICD-10-CM

## 2023-09-11 DIAGNOSIS — E1140 Type 2 diabetes mellitus with diabetic neuropathy, unspecified: Secondary | ICD-10-CM

## 2023-10-04 ENCOUNTER — Other Ambulatory Visit: Payer: Self-pay | Admitting: Family Medicine

## 2023-10-04 DIAGNOSIS — K219 Gastro-esophageal reflux disease without esophagitis: Secondary | ICD-10-CM

## 2023-10-23 ENCOUNTER — Ambulatory Visit: Admitting: Neurology

## 2023-11-01 ENCOUNTER — Other Ambulatory Visit: Payer: Self-pay

## 2023-11-01 ENCOUNTER — Telehealth: Payer: Self-pay

## 2023-11-01 ENCOUNTER — Other Ambulatory Visit: Payer: Self-pay | Admitting: Family Medicine

## 2023-11-01 DIAGNOSIS — K219 Gastro-esophageal reflux disease without esophagitis: Secondary | ICD-10-CM

## 2023-11-01 MED ORDER — HYDROCHLOROTHIAZIDE 25 MG PO TABS: 12.5000 mg | ORAL_TABLET | Freq: Every day | ORAL | 3 refills | Status: AC

## 2023-11-01 NOTE — Telephone Encounter (Signed)
Pt needs refill on hydrochlorothiazide. °

## 2023-11-18 ENCOUNTER — Other Ambulatory Visit: Payer: Self-pay

## 2023-11-18 ENCOUNTER — Other Ambulatory Visit: Payer: Self-pay | Admitting: Pharmacist

## 2023-11-18 DIAGNOSIS — E7800 Pure hypercholesterolemia, unspecified: Secondary | ICD-10-CM

## 2023-11-18 DIAGNOSIS — I10 Essential (primary) hypertension: Secondary | ICD-10-CM

## 2023-11-18 MED ORDER — ATORVASTATIN CALCIUM 80 MG PO TABS: 80.0000 mg | ORAL_TABLET | Freq: Every day | ORAL | 1 refills | Status: AC

## 2023-11-18 MED ORDER — LOSARTAN POTASSIUM 100 MG PO TABS: 100.0000 mg | ORAL_TABLET | Freq: Every day | ORAL | 1 refills | Status: AC

## 2023-11-18 NOTE — Progress Notes (Signed)
 Pharmacy Quality Measure Review  This patient is appearing on a report for being at risk of failing the adherence measure for diabetes medications this calendar year.   Medication: Ozempic  0.5 mg weekly  Last fill date: 11/05/23 for 28 day supply. Patient is not due for a refill yet. She reports that there was a time period she had been taken off Ozempic  due to GI upset, but she is back on 0.5 mg weekly now and is tolerating.   Reviewed adherence to losartan  and atorvastatin . She will need new refills for future fills, will collaborate with the office to do so.   Will collaborate with provider to facilitate refill needs. Patient would also benefit from an appointment to follow up on diabetes management and check A1c.   Catie [facility], PharmD, [facility] Clinical Pharmacist [phone]

## 2023-12-02 ENCOUNTER — Ambulatory Visit (INDEPENDENT_AMBULATORY_CARE_PROVIDER_SITE_OTHER): Admitting: Otolaryngology

## 2023-12-02 ENCOUNTER — Encounter (INDEPENDENT_AMBULATORY_CARE_PROVIDER_SITE_OTHER): Payer: Self-pay | Admitting: Otolaryngology

## 2023-12-02 VITALS — BP 146/87 | HR 95

## 2023-12-02 DIAGNOSIS — R42 Dizziness and giddiness: Secondary | ICD-10-CM

## 2023-12-03 NOTE — Progress Notes (Signed)
 CC: Recurrent dizziness  HPI:  Nina Olson is a 78 y.o. female who presents today complaining of recurrent dizziness for the past 5 months.  She describes the dizziness as an intermittent lightheaded and off-balance sensation.  Each episode typically last for several minutes.  She denies any spinning vertigo.  She denies any otalgia, otorrhea, tinnitus, or hearing loss.  She has no previous ENT surgery.  The patient was recently evaluated by a neurologist.  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  She was evaluated and treated by a physical therapist.  She was instructed to perform balance exercises at home.  The patient has a history of hypertension.  She is on multiple hypertensive medications.  Past Medical History:  Diagnosis Date   Anemia    Arthritis    Colon polyps    CVA (cerebral vascular accident) (HCC)    CVA (cerebrovascular accident) (HCC)    L MCA   GERD (gastroesophageal reflux disease)    High cholesterol    Hypertension    Osteopenia    Palpitations     Past Surgical History:  Procedure Laterality Date   CESAREAN SECTION      Family History  Problem Relation Age of Onset   Hypertension Mother    Osteoporosis Mother    Breast cancer Neg Hx     Social History:  reports that she has quit smoking. She quit smokeless tobacco use about 45 years ago. She reports that she does not drink alcohol and does not use drugs.  Allergies: No Known Allergies  Prior to Admission medications   Medication Sig Start Date End Date Taking? Authorizing Provider  Accu-Chek Softclix Lancets lancets USE TO CHECK CBG ONCE A DAY 08/17/19  Yes [place], Theodoro FALCON, MD  atorvastatin  (LIPITOR) 80 MG tablet Take 1 tablet (80 mg total) by mouth daily. 11/18/23  Yes Duanne Butler DASEN, MD  blood glucose meter kit and supplies Dispense based on patient and insurance preference. Check CBG once a day (FOR ICD-10 E10.9, E11.9). 04/28/19  Yes [place], Theodoro FALCON, MD   carbidopa -levodopa  (SINEMET  IR) 25-100 MG tablet Take 0.5 tablets by mouth 3 (three) times daily. 07/03/23  Yes Leigh Venetia CROME, MD  esomeprazole  (NEXIUM ) 40 MG capsule Take 1 capsule by mouth once daily 11/01/23  Yes Duanne Butler DASEN, MD  famotidine  (PEPCID ) 40 MG tablet Take 1 tablet by mouth once daily 03/14/23  Yes Duanne Butler DASEN, MD  gabapentin  (NEURONTIN ) 300 MG capsule Take 1 capsule (300 mg total) by mouth at bedtime as needed (nerve pain in feet). 06/04/22  Yes Duanne Butler DASEN, MD  glucose blood (ACCU-CHEK GUIDE) test strip USE TO CHECK CBG ONCE A DAY 02/22/21  Yes Chandra Harlene LABOR, NP  hydrochlorothiazide  (HYDRODIURIL ) 25 MG tablet Take 0.5 tablets (12.5 mg total) by mouth daily. Pt reports she takes half of 25 mg tablet 11/01/23  Yes Duanne Butler DASEN, MD  losartan  (COZAAR ) 100 MG tablet Take 1 tablet (100 mg total) by mouth daily. 11/18/23  Yes Duanne Butler DASEN, MD  Multiple Vitamin (MULTI-VITAMIN) tablet Take by mouth.   Yes [provider]  Omega-3 Fatty Acids (FISH OIL PO) Take by mouth as directed.   Yes [provider]  Probiotic Product (PROBIOTIC DAILY PO) Take by mouth.   Yes [provider]  Semaglutide ,0.25 or 0.5MG /DOS, (OZEMPIC , 0.25 OR 0.5 MG/DOSE,) 2 MG/3ML SOPN Inject 0.5 mg into the skin once a week. 09/10/23  Yes Duanne Butler DASEN, MD  sucralfate  (CARAFATE ) 1 g tablet TAKE 1 TABLET BY MOUTH 4 TIMES DAILY WITH  MEALS  AND  AT  BEDTIME. 11/27/22  Yes Duanne Butler DASEN, MD  traZODone  (DESYREL ) 50 MG tablet Take 0.5-1 tablets (25-50 mg total) by mouth at bedtime as needed for sleep. 05/01/21  Yes Chandra Harlene LABOR, NP  valACYclovir  (VALTREX ) 1000 MG tablet Take 1 tablet (1,000 mg total) by mouth 2 (two) times daily. 06/15/22  Yes Duanne Butler DASEN, MD    Blood pressure (!) 146/87, pulse 95, SpO2 95%. Exam: General: Communicates without difficulty, well nourished, no acute distress. Head: Normocephalic, no evidence injury, no tenderness, facial  buttresses intact without stepoff. Face/sinus: No tenderness to palpation and percussion. Facial movement is normal and symmetric. Eyes: PERRL, EOMI. No scleral icterus, conjunctivae clear. Neuro: CN II exam reveals vision grossly intact.  No nystagmus at any point of gaze. Ears: Auricles well formed without lesions.  Ear canals are intact without mass or lesion.  No erythema or edema is appreciated.  The TMs are intact without fluid. Nose: External evaluation reveals normal support and skin without lesions.  Dorsum is intact.  Anterior rhinoscopy reveals normal mucosa over anterior aspect of inferior turbinates and intact septum.  No purulence noted. Oral:  Oral cavity and oropharynx are intact, symmetric, without erythema or edema.  Mucosa is moist without lesions. Neck: Full range of motion without pain.  There is no significant lymphadenopathy.  No masses palpable.  Thyroid  bed within normal limits to palpation.  Parotid glands and submandibular glands equal bilaterally without mass.  Trachea is midline. Neuro:  CN 2-12 grossly intact. Vestibular: No nystagmus at any point of gaze. Dix Hallpike negative. Vestibular: There is no nystagmus with pneumatic pressure on either tympanic membrane or Valsalva. The cerebellar examination is unremarkable.   Assessment: 1.  The patient's ear canals, tympanic membranes, and middle ear spaces are normal.  No middle ear effusion is noted.  Dix-Hallpike maneuver is negative. 2.  Recurrent dizziness of unknown etiology.  Based on her history, it is likely that her dizziness is central in etiology.  Other possible differential diagnoses include transient BPPV, vestibular migraine, Meniere's disease, peripheral vestibular dysfunction, or other systemic causes.   Plan: 1.  The physical exam findings are reviewed with the patient. 2.  The pathophysiology of vestibular dysfunction and dizziness are discussed extensively with the patient. The possible differential diagnoses  are reviewed. Questions are invited and answered.  3.  Continue with her balance exercises daily.  4.  If the patient continues to be symptomatic, she may benefit from vestibular neurodiagnostic testing at a tertiary care center to evaluate for possible vestibular dysfunction.   Quadre Bristol W Sareen Randon 12/03/2023, 10:57 AM

## 2023-12-12 ENCOUNTER — Encounter: Payer: Self-pay | Admitting: Family Medicine

## 2023-12-12 ENCOUNTER — Ambulatory Visit: Admitting: Family Medicine

## 2023-12-12 VITALS — BP 130/70 | HR 92 | Temp 97.7°F | Ht 65.0 in | Wt 167.4 lb

## 2023-12-12 DIAGNOSIS — Z23 Encounter for immunization: Secondary | ICD-10-CM

## 2023-12-12 DIAGNOSIS — I10 Essential (primary) hypertension: Secondary | ICD-10-CM | POA: Diagnosis not present

## 2023-12-12 DIAGNOSIS — M6740 Ganglion, unspecified site: Secondary | ICD-10-CM | POA: Diagnosis not present

## 2023-12-12 DIAGNOSIS — Z131 Encounter for screening for diabetes mellitus: Secondary | ICD-10-CM | POA: Diagnosis not present

## 2023-12-12 DIAGNOSIS — L209 Atopic dermatitis, unspecified: Secondary | ICD-10-CM | POA: Insufficient documentation

## 2023-12-12 NOTE — Progress Notes (Signed)
 Subjective:    Patient ID: Nina Olson, female    DOB: 1945/11/24, 78 y.o.   MRN:   1 cm cyst on dorsum of right wrist.  Mobile, non tender.  No erythema or warmth.  Does have burning pain and stinging pain around left lateral epicondyle for 3 months.  No TTP.  No pain with movement.  No evidence of lateral epicondylitis.  No palpable abnormality.  Past Medical History:  Diagnosis Date   Anemia    Arthritis    Colon polyps    CVA (cerebral vascular accident) (HCC)    CVA (cerebrovascular accident) (HCC)    L MCA   GERD (gastroesophageal reflux disease)    High cholesterol    Hypertension    Osteopenia    Palpitations    Past Surgical History:  Procedure Laterality Date   CESAREAN SECTION     Current Outpatient Medications on File Prior to Visit  Medication Sig Dispense Refill   Accu-Chek Softclix Lancets lancets USE TO CHECK CBG ONCE A DAY 100 each 0   atorvastatin  (LIPITOR) 80 MG tablet Take 1 tablet (80 mg total) by mouth daily. 90 tablet 1   blood glucose meter kit and supplies Dispense based on patient and insurance preference. Check CBG once a day (FOR ICD-10 E10.9, E11.9). 1 each 0   carbidopa -levodopa  (SINEMET  IR) 25-100 MG tablet Take 0.5 tablets by mouth 3 (three) times daily. 45 tablet 5   esomeprazole  (NEXIUM ) 40 MG capsule Take 1 capsule by mouth once daily 90 capsule 0   famotidine  (PEPCID ) 40 MG tablet Take 1 tablet by mouth once daily 90 tablet 1   gabapentin  (NEURONTIN ) 300 MG capsule Take 1 capsule (300 mg total) by mouth at bedtime as needed (nerve pain in feet). 30 capsule 3   glucose blood (ACCU-CHEK GUIDE) test strip USE TO CHECK CBG ONCE A DAY 100 each 6   hydrochlorothiazide  (HYDRODIURIL ) 25 MG tablet Take 0.5 tablets (12.5 mg total) by mouth daily. Pt reports she takes half of 25 mg tablet 90 tablet 3   losartan  (COZAAR ) 100 MG tablet Take 1 tablet (100 mg total) by mouth daily. 90 tablet 1   Multiple Vitamin (MULTI-VITAMIN) tablet Take by mouth.      Omega-3 Fatty Acids (FISH OIL PO) Take by mouth as directed.     Probiotic Product (PROBIOTIC DAILY PO) Take by mouth.     Semaglutide ,0.25 or 0.5MG /DOS, (OZEMPIC , 0.25 OR 0.5 MG/DOSE,) 2 MG/3ML SOPN Inject 0.5 mg into the skin once a week. 3 mL 11   sucralfate  (CARAFATE ) 1 g tablet TAKE 1 TABLET BY MOUTH 4 TIMES DAILY WITH  MEALS  AND  AT  BEDTIME. (Patient not taking: Reported on 12/12/2023) 120 tablet 0   traZODone  (DESYREL ) 50 MG tablet Take 0.5-1 tablets (25-50 mg total) by mouth at bedtime as needed for sleep. (Patient not taking: Reported on 12/12/2023) 90 tablet 1   valACYclovir  (VALTREX ) 1000 MG tablet Take 1 tablet (1,000 mg total) by mouth 2 (two) times daily. (Patient not taking: Reported on 12/12/2023) 14 tablet 5   No current facility-administered medications on file prior to visit.   No Known Allergies Social History   Socioeconomic History   Marital status: Married    Spouse name: Not on file   Number of children: 3   Years of education: Not on file   Highest education level: Not on file  Occupational History   Not on file  Tobacco Use   Smoking status:  Former   Smokeless tobacco: Former    Quit date: 04/09/1978  Vaping Use   Vaping status: Never Used  Substance and Sexual Activity   Alcohol use: No   Drug use: No   Sexual activity: Never  Other Topics Concern   Not on file  Social History Narrative   Are you right handed or left handed? Right   Are you currently employed ? no   What is your current occupation?retired   Do you live at home alone? No    Who lives with you? husband   What type of home do you live in: 1 story or 2 story? one    Caffeine 1 soda a day    Social Drivers of Corporate investment banker Strain: Low Risk  (04/25/2023)   Overall Financial Resource Strain (CARDIA)    Difficulty of Paying Living Expenses: Not hard at all  Food Insecurity: No Food Insecurity (04/25/2023)   Hunger Vital Sign    Worried About Running Out of Food in the Last  Year: Never true    Ran Out of Food in the Last Year: Never true  Transportation Needs: No Transportation Needs (04/25/2023)   PRAPARE - Administrator, Civil Service (Medical): No    Lack of Transportation (Non-Medical): No  Physical Activity: Inactive (04/25/2023)   Exercise Vital Sign    Days of Exercise per Week: 0 days    Minutes of Exercise per Session: 0 min  Stress: No Stress Concern Present (04/25/2023)   [employer] of Occupational Health - Occupational Stress Questionnaire    Feeling of Stress : Not at all  Social Connections: Socially Integrated (04/25/2023)   Social Connection and Isolation Panel    Frequency of Communication with Friends and Family: Three times a week    Frequency of Social Gatherings with Friends and Family: Once a week    Attends Religious Services: More than 4 times per year    Active Member of [employer] or Organizations: Yes    Attends Engineer, structural: More than 4 times per year    Marital Status: Married  Catering manager Violence: Not At Risk (04/25/2023)   Humiliation, Afraid, Rape, and Kick questionnaire    Fear of Current or Ex-Partner: No    Emotionally Abused: No    Physically Abused: No    Sexually Abused: No     Review of Systems  All other systems reviewed and are negative.      Objective:   Physical Exam Constitutional:      Appearance: Normal appearance. She is normal weight.  Cardiovascular:     Rate and Rhythm: Normal rate and regular rhythm.     Pulses: Normal pulses.     Heart sounds: Normal heart sounds. No murmur heard.    No friction rub. No gallop.  Pulmonary:     Effort: Pulmonary effort is normal. No respiratory distress.     Breath sounds: Normal breath sounds. No stridor. No wheezing, rhonchi or rales.  Musculoskeletal:     Left elbow: No swelling, deformity, effusion or lacerations. Normal range of motion. No tenderness.     Right wrist: Deformity present. No tenderness or bony  tenderness. Normal range of motion.       Hands:     Right lower leg: No edema.     Left lower leg: No edema.  Neurological:     General: No focal deficit present.     Mental Status: She is alert  and oriented to person, place, and time.           Assessment & Plan:  Flu vaccine need - Plan: Flu vaccine HIGH DOSE PF(Fluzone Trivalent)  Benign essential HTN - Plan: Flu vaccine HIGH DOSE PF(Fluzone Trivalent), Hemoglobin A1c, CBC with Differential/Platelet, Comprehensive metabolic panel with GFR, Lipid panel  Ganglion cyst Explained this is benign ganglion cyst.  Offered ortho consult but patient declined.  While here check fasting labs.  BP is excellent.  Suspect neuropathy around left elbow but is mild.  Does not want to take gabapentin  at present.

## 2023-12-13 ENCOUNTER — Ambulatory Visit: Payer: Self-pay | Admitting: Family Medicine

## 2023-12-13 LAB — LIPID PANEL
Cholesterol: 147 mg/dL (ref <200)
HDL: 44 mg/dL — ABNORMAL LOW (ref >=50)
LDL Cholesterol (Calc): 87 mg/dL
Non-HDL Cholesterol (Calc): 103 mg/dL (ref <130)
Total CHOL/HDL Ratio: 3.3 (calc) (ref <5.0)
Triglycerides: 70 mg/dL (ref <150)

## 2023-12-13 LAB — COMPREHENSIVE METABOLIC PANEL WITH GFR
AG Ratio: 1.6 (calc) (ref 1.0–2.5)
ALT: 20 U/L (ref 6–29)
AST: 13 U/L (ref 10–35)
Albumin: 4.1 g/dL (ref 3.6–5.1)
Alkaline phosphatase (APISO): 94 U/L (ref 37–153)
BUN: 14 mg/dL (ref 7–25)
CO2: 30 mmol/L (ref 20–32)
Calcium: 9.7 mg/dL (ref 8.6–10.4)
Chloride: 105 mmol/L (ref 98–110)
Creat: 0.86 mg/dL (ref 0.60–1.00)
Globulin: 2.5 g/dL (ref 1.9–3.7)
Glucose, Bld: 85 mg/dL (ref 65–99)
Potassium: 4.1 mmol/L (ref 3.5–5.3)
Sodium: 142 mmol/L (ref 135–146)
Total Bilirubin: 0.6 mg/dL (ref 0.2–1.2)
Total Protein: 6.6 g/dL (ref 6.1–8.1)
eGFR: 70 mL/min/1.73m2 (ref >=60)

## 2023-12-13 LAB — CBC WITH DIFFERENTIAL/PLATELET
Absolute Lymphocytes: 1337 {cells}/uL (ref 850–3900)
Absolute Monocytes: 648 {cells}/uL (ref 200–950)
Basophils Absolute: 29 {cells}/uL (ref 0–200)
Basophils Relative: 0.7 %
Eosinophils Absolute: 221 {cells}/uL (ref 15–500)
Eosinophils Relative: 5.4 %
HCT: 39.7 % (ref 35.0–45.0)
Hemoglobin: 12.8 g/dL (ref 11.7–15.5)
MCH: 28.6 pg (ref 27.0–33.0)
MCHC: 32.2 g/dL (ref 32.0–36.0)
MCV: 88.6 fL (ref 80.0–100.0)
MPV: 9.5 fL (ref 7.5–12.5)
Monocytes Relative: 15.8 %
Neutro Abs: 1866 {cells}/uL (ref 1500–7800)
Neutrophils Relative %: 45.5 %
Platelets: 254 Thousand/uL (ref 140–400)
RBC: 4.48 Million/uL (ref 3.80–5.10)
RDW: 13.3 % (ref 11.0–15.0)
Total Lymphocyte: 32.6 %
WBC: 4.1 Thousand/uL (ref 3.8–10.8)

## 2023-12-13 LAB — HEMOGLOBIN A1C
Hgb A1c MFr Bld: 6.6 % — ABNORMAL HIGH (ref <5.7)
Mean Plasma Glucose: 143 mg/dL
eAG (mmol/L): 7.9 mmol/L

## 2023-12-17 ENCOUNTER — Telehealth: Payer: Self-pay

## 2023-12-17 NOTE — Progress Notes (Addendum)
   12/17/2023  Patient ID: Nina Olson, female   DOB: Jun 23, 1945, 78 y.o.   MRN: [id number]  This patient is appearing on a report for being at risk of failing the adherence measure for identified medications this calendar year.   Medication Adherence Summary (STAR/HEDIS Monitoring): Adherence Category: diabetes    Drug Name: Ozempic  0.5 mg   Sold Date:12/04/2023 Days' Supply: 28     Notes: ? Adherence data pulled from pharmacy claims portal Dr. Annemarie. ? Patient education provided on importance of adherence for chronic condition management and STAR quality performance. ? Reviewed barriers to adherence: N/A.   Dorcas Solian, PharmD Clinical Pharmacist Cell: [phone]

## 2024-01-21 ENCOUNTER — Ambulatory Visit: Payer: Self-pay

## 2024-01-21 NOTE — Telephone Encounter (Signed)
 FYI Only or Action Required?: FYI only for provider.  Patient was last seen in primary care on 12/12/2023 by Duanne Butler DASEN, MD.  Called Nurse Triage reporting Knee Pain.  Symptoms began several days ago.  Interventions attempted: OTC medications: Tylenol  and muscle rub.  Symptoms are: gradually worsening.  Triage Disposition: See PCP When Office is Open (Within 3 Days)  Patient/caregiver understands and will follow disposition?: Yes                             Copied from CRM #[id number]. Topic: Clinical - Red Word Triage >> Jan 21, 2024  1:13 PM Avram MATSU wrote: Red Word that prompted transfer to Nurse Triage: pain in right knee would like to have a shot Reason for Disposition  [1] MODERATE pain (e.g., interferes with normal activities, limping) AND [2] present > 3 days  Answer Assessment - Initial Assessment Questions 1. LOCATION and RADIATION: Where is the pain located?      Right knee 2. QUALITY: What does the pain feel like?  (e.g., sharp, dull, aching, burning)     Catchy and tight, denies sharp pain 3. SEVERITY: How bad is the pain? What does it keep you from doing?   (Scale 1-10; or mild, moderate, severe)     Rates pain a 6, states pain makes it hard to walk 4. ONSET: When did the pain start? Does it come and go, or is it there all the time?     On and off, pain worsened a few days ago 5. RECURRENT: Have you had this pain before? If Yes, ask: When, and what happened then?     Yes, states pain is typically relieved by topical muscle rub 6. SETTING: Has there been any recent work, exercise or other activity that involved that part of the body?      States she does aerobic exercises at [facility] 7. AGGRAVATING FACTORS: What makes the knee pain worse? (e.g., walking, climbing stairs, running)     Walking/moving makes pain worse 8. ASSOCIATED SYMPTOMS: Is there any swelling or redness of the knee?     Mild swelling, denies  redness 9. OTHER SYMPTOMS: Do you have any other symptoms? (e.g., calf pain, chest pain, difficulty breathing, fever)     Dizziness- currently seeing a neurologist for this, denies chest pain, denies difficulty breathing, denies fever  Protocols used: Knee Pain-A-AH

## 2024-01-23 ENCOUNTER — Ambulatory Visit (INDEPENDENT_AMBULATORY_CARE_PROVIDER_SITE_OTHER): Admitting: Family Medicine

## 2024-01-23 ENCOUNTER — Encounter: Payer: Self-pay | Admitting: Family Medicine

## 2024-01-23 VITALS — BP 124/62 | HR 98 | Temp 98.8°F | Ht 65.0 in | Wt 167.0 lb

## 2024-01-23 DIAGNOSIS — M25561 Pain in right knee: Secondary | ICD-10-CM

## 2024-01-23 MED ORDER — TRIAMCINOLONE ACETONIDE 40 MG/ML IJ SUSP
80.0000 mg | Freq: Once | INTRAMUSCULAR | Status: AC
  Administered 2024-01-23: 80 mg via INTRA_ARTICULAR

## 2024-01-23 NOTE — Addendum Note (Signed)
 Addended by: ERIKA ELIDA RAMAN on: 01/23/2024 03:41 PM   Modules accepted: Orders

## 2024-01-23 NOTE — Progress Notes (Signed)
 Subjective:    Patient ID: Nina Olson, female    DOB: 18-Nov-1945, 78 y.o.   MRN: [id number]  Patient presents today with pain in her right knee.  She states has been hurting for the last 2 weeks.  She started doing water aerobics although she denies any specific injury.  She states that it hurts to stand and walk.  Also hurts to extend the knee.  The pain is primarily over the medial joint line.  There is a slight effusion in that area.  There is no erythema or warmth.  She has tried Celebrex  without relief. Past Medical History:  Diagnosis Date   Anemia    Arthritis    Colon polyps    CVA (cerebral vascular accident) (HCC)    CVA (cerebrovascular accident) (HCC)    L MCA   GERD (gastroesophageal reflux disease)    High cholesterol    Hypertension    Osteopenia    Palpitations    Past Surgical History:  Procedure Laterality Date   CESAREAN SECTION     Current Outpatient Medications on File Prior to Visit  Medication Sig Dispense Refill   Accu-Chek Softclix Lancets lancets USE TO CHECK CBG ONCE A DAY 100 each 0   atorvastatin  (LIPITOR) 80 MG tablet Take 1 tablet (80 mg total) by mouth daily. 90 tablet 1   blood glucose meter kit and supplies Dispense based on patient and insurance preference. Check CBG once a day (FOR ICD-10 E10.9, E11.9). 1 each 0   carbidopa -levodopa  (SINEMET  IR) 25-100 MG tablet Take 0.5 tablets by mouth 3 (three) times daily. 45 tablet 5   esomeprazole  (NEXIUM ) 40 MG capsule Take 1 capsule by mouth once daily 90 capsule 0   famotidine  (PEPCID ) 40 MG tablet Take 1 tablet by mouth once daily 90 tablet 1   gabapentin  (NEURONTIN ) 300 MG capsule Take 1 capsule (300 mg total) by mouth at bedtime as needed (nerve pain in feet). 30 capsule 3   glucose blood (ACCU-CHEK GUIDE) test strip USE TO CHECK CBG ONCE A DAY 100 each 6   hydrochlorothiazide  (HYDRODIURIL ) 25 MG tablet Take 0.5 tablets (12.5 mg total) by mouth daily. Pt reports she takes half of 25 mg tablet 90  tablet 3   losartan  (COZAAR ) 100 MG tablet Take 1 tablet (100 mg total) by mouth daily. 90 tablet 1   Multiple Vitamin (MULTI-VITAMIN) tablet Take by mouth.     Omega-3 Fatty Acids (FISH OIL PO) Take by mouth as directed.     Probiotic Product (PROBIOTIC DAILY PO) Take by mouth.     Semaglutide ,0.25 or 0.5MG /DOS, (OZEMPIC , 0.25 OR 0.5 MG/DOSE,) 2 MG/3ML SOPN Inject 0.5 mg into the skin once a week. 3 mL 11   valACYclovir  (VALTREX ) 1000 MG tablet Take 1 tablet (1,000 mg total) by mouth 2 (two) times daily. (Patient not taking: Reported on 12/12/2023) 14 tablet 5   No current facility-administered medications on file prior to visit.   No Known Allergies Social History   Socioeconomic History   Marital status: Married    Spouse name: Not on file   Number of children: 3   Years of education: Not on file   Highest education level: Associate degree: academic program  Occupational History   Not on file  Tobacco Use   Smoking status: Former   Smokeless tobacco: Former    Quit date: 04/09/1978  Vaping Use   Vaping status: Never Used  Substance and Sexual Activity   Alcohol use: No  Drug use: No   Sexual activity: Never  Other Topics Concern   Not on file  Social History Narrative   Are you right handed or left handed? Right   Are you currently employed ? no   What is your current occupation?retired   Do you live at home alone? No    Who lives with you? husband   What type of home do you live in: 1 story or 2 story? one    Caffeine 1 soda a day    Social Drivers of Corporate investment banker Strain: Low Risk  (01/22/2024)   Overall Financial Resource Strain (CARDIA)    Difficulty of Paying Living Expenses: Not very hard  Food Insecurity: No Food Insecurity (01/22/2024)   Hunger Vital Sign    Worried About Running Out of Food in the Last Year: Never true    Ran Out of Food in the Last Year: Never true  Transportation Needs: No Transportation Needs (01/22/2024)   PRAPARE -  Administrator, Civil Service (Medical): No    Lack of Transportation (Non-Medical): No  Physical Activity: Inactive (04/25/2023)   Exercise Vital Sign    Days of Exercise per Week: 0 days    Minutes of Exercise per Session: 0 min  Stress: No Stress Concern Present (04/25/2023)   [employer] of Occupational Health - Occupational Stress Questionnaire    Feeling of Stress : Not at all  Social Connections: Socially Integrated (01/22/2024)   Social Connection and Isolation Panel    Frequency of Communication with Friends and Family: More than three times a week    Frequency of Social Gatherings with Friends and Family: Three times a week    Attends Religious Services: More than 4 times per year    Active Member of Clubs or Organizations: Yes    Attends Banker Meetings: More than 4 times per year    Marital Status: Married  Catering manager Violence: Not At Risk (04/25/2023)   Humiliation, Afraid, Rape, and Kick questionnaire    Fear of Current or Ex-Partner: No    Emotionally Abused: No    Physically Abused: No    Sexually Abused: No     Review of Systems  All other systems reviewed and are negative.      Objective:   Physical Exam Constitutional:      Appearance: Normal appearance. She is normal weight.  Cardiovascular:     Rate and Rhythm: Normal rate and regular rhythm.     Pulses: Normal pulses.     Heart sounds: Normal heart sounds. No murmur heard.    No friction rub. No gallop.  Pulmonary:     Effort: Pulmonary effort is normal. No respiratory distress.     Breath sounds: Normal breath sounds. No stridor. No wheezing, rhonchi or rales.  Musculoskeletal:     Right knee: Decreased range of motion. Tenderness present over the medial joint line.     Right lower leg: No edema.     Left lower leg: No edema.  Neurological:     General: No focal deficit present.     Mental Status: She is alert and oriented to person, place, and time.           Acute pain of right knee Based on her exam, I believe the patient likely has knee pain due to osteoarthritis.  We discussed trying NSAIDs for longer however she request a cortisone injection.  Using sterile technique, I injected the  right knee with 2 cc of lidocaine, 2 cc of Marcaine , and 2 cc of 40 mg/mL Kenalog .  The patient tolerated the procedure well without complication.

## 2024-01-31 ENCOUNTER — Other Ambulatory Visit: Payer: Self-pay | Admitting: Neurology

## 2024-01-31 DIAGNOSIS — G20C Parkinsonism, unspecified: Secondary | ICD-10-CM

## 2024-02-06 NOTE — Progress Notes (Deleted)
 NEUROLOGY FOLLOW UP OFFICE NOTE  Nina Olson [id number]  Subjective:  Nina Olson is a 78 y.o. year old female with a history of DM, HTN, HLD, GERD, former smoker who we last saw on 08/14/23 for dizziness and imbalance and tingling in her feet.  To briefly review: Initial consult 04/26/22: Symptoms started about 2 years ago (2022) in bilateral feet with tingling. It would feel like she was walking on cardboard. She saw podiatry who felt she may have a neuroma as she was having particular pain in right great toe. She got an injection that helped a little, but then symptoms returned. Over the last 2 months, her feet have felt like they are tightening (left > right). She denies significant cramps. She sometimes feels off balance, but denies any falls. She endorses back pain and shooting pains from back into buttocks into legs (calling it sciatica). It is also an achy sensation. If she moves in certain ways, it can grab her and make it painful to move in certain way. She has never been given medication for her symptoms. She uses over the counter icy hot like things that help with the back. She has never used anything on her feet.   She does not have significant symptoms in her hands. She will occasionally wake up with a hand tingling. Of note, patient broke her left hand 25 years ago and has some residual deformity.   Patient also mentions that her right knee has been hurting for about a month and that she has swelling on the medial aspect of her right knee.   Of note, patient is on B complex and zinc  for 2-3 years.   Podiatry had mentioned EMG to the patient, but she has not had this or scheduled it at this point.   She does not report any constitutional symptoms like fever, night sweats, anorexia or unintentional weight loss.   EtOH use: No, has not drank much since 1980s Restrictive diet? No Family history of neuropathy/myopathy/NM disease? Mother has pain in legs too (?neuropathy)    07/03/23: She has had worsening dizziness since early 06/2023 associated with loss of balance. The dizziness has been present for about a year. She first mentioned to her PCP in 10/2022 by my chart review. BP was low that day so hydrochlorothiazide  was stopped. This did not seem to affect symptoms. 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   She describes the dizziness as a boat rocking sensation and lightheadedness. It started intermittent and is now constant. There is no room spinning. Bending over or turning too fast will provoke her symptoms. She feels her walking is off. She denies any recent falls. She denies any significant hearing problems.    She denies problems with smell but food sometimes tastes funny. She denies any REM sleep behavior. She does freeze when trying to move sometimes. She also sometimes notices a tremor at rest.   Patient completed vestibular rehab but continued to have symptoms. She is not sure if this helped. She did not go long though because she felt she could do similar things at home. She has not kept on this though. She was sent to PT sciatica. She was prescribed gabapentin  300 mg at bedtime for pain in 2024, but she does not really take this often.   Patient is not on aspirin though she has been told to take. She is on  atorvastatin  80 mg daily.   Her tingling in the feet is unchanged from prior.   Patient saw dermatology and was told she had eczema and she got cream for it.  We decided to trial Sinemet  1/2 tablet TID on 07/03/23.   08/14/23: Patient has been taking Sinemet  1/2 tablet TID. She thinks this medication is making her more dizzy. She thinks walking may be a little better. That comes and goes though.    She continues to take aspirin 81 mg daily and atorvastatin  80 mg daily.    She has occasional stabbing pain in feet and fingers and sometimes in the left  elbow.  Most recent Assessment and Plan (08/14/23): This is Nina Olson, a 78 y.o. female with: Dizziness and imbalance - The etiology of these symptoms is currently unclear. There are elements that sound peripheral and vestibular raising the possibility of BPPV or other vestibular dysfunction. Of note, her orthostatic vitals are negative. However, patient's gait and freezing symptoms are concerning for parkinsonism. Her dizziness is worse when she stands, but is also present at rest (sitting). I think there may be a peripheral component in addition to orthostatic intolerance. She thinks Sinemet  may have helped with walking but maybe made dizziness worse. She would like to continue medication for now though. Tingling in bilateral feet - stable. Could contribute to imbalance. 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 Likely incidental and not causing her current symptoms. She also has chronic microvascular ischemia and atrophy.    Plan: -Patient would like to continue Sinemet  1/2 tablet TID -Referral to ENT for dizziness as I am concerned this may be inner ear. Patient may need vestibular testing -Continue home vestibular therapy exercises -Fall precautions discussed   For secondary stroke prevention: -Continue aspirin 81 mg daily -Continue atorvastatin  80 mg daily   For neuropathy: -Continue gabapentin  as needed -Continue alpha lipoic acid 600 mg once or twice a day  Since their last visit: Patient stopped Sinemet  due to dizziness around 09/04/23, but then restarted at twice daily???.  She saw ENT on 12/02/23. They did not think it was peripheral, but suggested if she continued to have symptoms, she may benefit from vestibular neurodiagnostic testing at a tertiary care center to evaluate for vestibular dysfunction.  ***  MEDICATIONS:  Outpatient Encounter Medications as of 02/14/2024  Medication Sig   Accu-Chek Softclix Lancets lancets USE TO CHECK  CBG ONCE A DAY   atorvastatin  (LIPITOR) 80 MG tablet Take 1 tablet (80 mg total) by mouth daily.   blood glucose meter kit and supplies Dispense based on patient and insurance preference. Check CBG once a day (FOR ICD-10 E10.9, E11.9).   carbidopa -levodopa  (SINEMET  IR) 25-100 MG tablet TAKE 1/2 (ONE-HALF) TABLET BY MOUTH THREE TIMES DAILY   esomeprazole  (NEXIUM ) 40 MG capsule Take 1 capsule by mouth once daily   famotidine  (PEPCID ) 40 MG tablet Take 1 tablet by mouth once daily   gabapentin  (NEURONTIN ) 300 MG capsule Take 1 capsule (300 mg total) by mouth at bedtime as needed (nerve pain in feet).   glucose blood (ACCU-CHEK GUIDE) test strip USE TO CHECK CBG ONCE A DAY   hydrochlorothiazide  (HYDRODIURIL ) 25 MG tablet Take 0.5 tablets (12.5 mg total) by mouth daily. Pt reports she takes half of 25 mg tablet   losartan  (COZAAR ) 100 MG tablet Take 1 tablet (100 mg total) by mouth daily.   Multiple Vitamin (MULTI-VITAMIN) tablet Take by mouth.   Omega-3 Fatty  Acids (FISH OIL PO) Take by mouth as directed.   Probiotic Product (PROBIOTIC DAILY PO) Take by mouth.   Semaglutide ,0.25 or 0.5MG /DOS, (OZEMPIC , 0.25 OR 0.5 MG/DOSE,) 2 MG/3ML SOPN Inject 0.5 mg into the skin once a week.   valACYclovir  (VALTREX ) 1000 MG tablet Take 1 tablet (1,000 mg total) by mouth 2 (two) times daily. (Patient not taking: Reported on 12/12/2023)   No facility-administered encounter medications on file as of 02/14/2024.    PAST MEDICAL HISTORY: Past Medical History:  Diagnosis Date   Anemia    Arthritis    Colon polyps    CVA (cerebral vascular accident) (HCC)    CVA (cerebrovascular accident) (HCC)    L MCA   GERD (gastroesophageal reflux disease)    High cholesterol    Hypertension    Osteopenia    Palpitations     PAST SURGICAL HISTORY: Past Surgical History:  Procedure Laterality Date   CESAREAN SECTION      ALLERGIES: No Known Allergies  FAMILY HISTORY: Family History  Problem Relation Age of Onset    Hypertension Mother    Osteoporosis Mother    Breast cancer Neg Hx     SOCIAL HISTORY: Social History   Tobacco Use   Smoking status: Former   Smokeless tobacco: Former    Quit date: 04/09/1978  Vaping Use   Vaping status: Never Used  Substance Use Topics   Alcohol use: No   Drug use: No   Social History   Social History Narrative   Are you right handed or left handed? Right   Are you currently employed ? no   What is your current occupation?retired   Do you live at home alone? No    Who lives with you? husband   What type of home do you live in: 1 story or 2 story? one    Caffeine 1 soda a day       Objective:  Vital Signs:  There were no vitals taken for this visit.  ***  Labs and Imaging review: New results: 12/12/23: Lipid panel: tChol 147, LDL 87, TG 70 CMP unremarkable CBC w/ diff unremarkable HbA1c: 6.6  TSH (08/29/23): wnl  Previously reviewed results: HbA1c (04/16/23): 7.6 Lipid panel (04/16/23): tChol 161, LDL 95, TG 73   04/26/22: Zinc  wnl Copper  wnl Vit B6 wnl IFE: no M protein   Normal or unremarkable: CBC, CMP, folate B12 (03/07/22): 822 HbA1c (09/18/21): 6.6   Imaging: Lumbar xray (12/05/16): FINDINGS: The lumbar vertebral bodies are preserved in height. The disc space heights are well maintained. There is mild facet joint hypertrophy at L4-5. There are no pars defects in there is no spondylolisthesis. The pedicles and transverse processes are intact. The observed portions of the sacrum are normal. The visualized portions of the SI joints appear normal for age. There is calcification in the wall of the abdominal aorta.   IMPRESSION: Mild degenerative facet joint change at L4-5. There is no compression fracture, spondylolisthesis, or high-grade disc space narrowing.   Abdominal aortic atherosclerosis.   Cervical spine xray (12/05/16): FINDINGS: Diffuse degenerative change. No acute bony or joint abnormality identified. No evidence  of fracture dislocation. Pulmonary apices are clear .   IMPRESSION: Diffuse degenerative change.  No acute abnormality.   MRI brain w/wo contrast (12/13/22): IMPRESSION: 1. No acute intracranial abnormality. 2. 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 Moderate for age cerebral white matter and mild for age other signal changes in the bilateral brain, compatible  with chronic small vessel disease.   Carotid ultrasound (01/21/23): IMPRESSION: Color duplex indicates no significant plaque, with no hemodynamically significant stenosis by duplex criteria in the extracranial cerebrovascular circulation.  Assessment/Plan:  This is Nina Olson, a 78 y.o. female with: ***   Plan: ***  Return to clinic in ***  Total time spent reviewing records, interview, history/exam, documentation, and coordination of care on day of encounter:  *** min  Venetia Potters, MD

## 2024-02-10 ENCOUNTER — Telehealth: Payer: Self-pay | Admitting: Neurology

## 2024-02-10 ENCOUNTER — Telehealth: Payer: Self-pay

## 2024-02-10 ENCOUNTER — Other Ambulatory Visit: Payer: Self-pay | Admitting: Family Medicine

## 2024-02-10 MED ORDER — NIRMATRELVIR/RITONAVIR (PAXLOVID)TABLET: 3.0000 | ORAL_TABLET | Freq: Two times a day (BID) | ORAL | 0 refills | Status: AC

## 2024-02-10 NOTE — Telephone Encounter (Signed)
 Copied from CRM #[id number]. Topic: Clinical - Medical Advice >> Feb 10, 2024  1:52 PM Lonell PEDLAR wrote: Reason for CRM: Patient tested positive for COVID today, wondering if there is anything she should be taking.

## 2024-02-10 NOTE — Telephone Encounter (Signed)
 Pt called in this afternoon and she wanted the doctor to know that she is dizzy  every day. She was wondering if Dr. Leigh could call her in some medicine for the dizzy spells.  Pt has Covid at this time, she found that out today. Thanks

## 2024-02-11 ENCOUNTER — Telehealth: Payer: Self-pay

## 2024-02-11 ENCOUNTER — Other Ambulatory Visit: Payer: Self-pay | Admitting: Neurology

## 2024-02-11 DIAGNOSIS — R42 Dizziness and giddiness: Secondary | ICD-10-CM

## 2024-02-11 MED ORDER — MECLIZINE HCL 25 MG PO TABS
25.0000 mg | ORAL_TABLET | Freq: Two times a day (BID) | ORAL | 0 refills | Status: AC | PRN
Start: 2024-02-11 — End: ?

## 2024-02-12 NOTE — Telephone Encounter (Signed)
 Called and informed pt of Dr. Venus answer she understood and wanted to be put on waiting list when she recovers from Covid.

## 2024-02-13 NOTE — Telephone Encounter (Signed)
 SABRA

## 2024-02-14 ENCOUNTER — Ambulatory Visit: Admitting: Neurology

## 2024-02-27 NOTE — Progress Notes (Signed)
 NEUROLOGY FOLLOW UP OFFICE NOTE  CORRINE Olson [id number]  Subjective:  Nina Olson is a 78 y.o. year old female with a history of DM, HTN, HLD, GERD, former smoker who we last saw on 08/14/23 for dizziness and imbalance and tingling in her feet.  To briefly review: Initial consult 04/26/22: Symptoms started about 2 years ago (2022) in bilateral feet with tingling. It would feel like she was walking on cardboard. She saw podiatry who felt she may have a neuroma as she was having particular pain in right great toe. She got an injection that helped a little, but then symptoms returned. Over the last 2 months, her feet have felt like they are tightening (left > right). She denies significant cramps. She sometimes feels off balance, but denies any falls. She endorses back pain and shooting pains from back into buttocks into legs (calling it sciatica). It is also an achy sensation. If she moves in certain ways, it can grab her and make it painful to move in certain way. She has never been given medication for her symptoms. She uses over the counter icy hot like things that help with the back. She has never used anything on her feet.   She does not have significant symptoms in her hands. She will occasionally wake up with a hand tingling. Of note, patient broke her left hand 25 years ago and has some residual deformity.   Patient also mentions that her right knee has been hurting for about a month and that she has swelling on the medial aspect of her right knee.   Of note, patient is on B complex and zinc  for 2-3 years.   Podiatry had mentioned EMG to the patient, but she has not had this or scheduled it at this point.   She does not report any constitutional symptoms like fever, night sweats, anorexia or unintentional weight loss.   EtOH use: No, has not drank much since 1980s Restrictive diet? No Family history of neuropathy/myopathy/NM disease? Mother has pain in legs too (?neuropathy)    07/03/23: She has had worsening dizziness since early 06/2023 associated with loss of balance. The dizziness has been present for about a year. She first mentioned to her PCP in 10/2022 by my chart review. BP was low that day so hydrochlorothiazide  was stopped. This did not seem to affect symptoms. 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   She describes the dizziness as a boat rocking sensation and lightheadedness. It started intermittent and is now constant. There is no room spinning. Bending over or turning too fast will provoke her symptoms. She feels her walking is off. She denies any recent falls. She denies any significant hearing problems.    She denies problems with smell but food sometimes tastes funny. She denies any REM sleep behavior. She does freeze when trying to move sometimes. She also sometimes notices a tremor at rest.   Patient completed vestibular rehab but continued to have symptoms. She is not sure if this helped. She did not go long though because she felt she could do similar things at home. She has not kept on this though. She was sent to PT sciatica. She was prescribed gabapentin  300 mg at bedtime for pain in 2024, but she does not really take this often.   Patient is not on aspirin though she has been told to take. She is on  atorvastatin  80 mg daily.   Her tingling in the feet is unchanged from prior.   Patient saw dermatology and was told she had eczema and she got cream for it.  We decided to trial Sinemet  1/2 tablet TID on 07/03/23.   08/14/23: Patient has been taking Sinemet  1/2 tablet TID. She thinks this medication is making her more dizzy. She thinks walking may be a little better. That comes and goes though.    She continues to take aspirin 81 mg daily and atorvastatin  80 mg daily.    She has occasional stabbing pain in feet and fingers and sometimes in the left  elbow.  Most recent Assessment and Plan (08/14/23): This is Nina Olson, a 78 y.o. female with: Dizziness and imbalance - The etiology of these symptoms is currently unclear. There are elements that sound peripheral and vestibular raising the possibility of BPPV or other vestibular dysfunction. Of note, her orthostatic vitals are negative. However, patient's gait and freezing symptoms are concerning for parkinsonism. Her dizziness is worse when she stands, but is also present at rest (sitting). I think there may be a peripheral component in addition to orthostatic intolerance. She thinks Sinemet  may have helped with walking but maybe made dizziness worse. She would like to continue medication for now though. Tingling in bilateral feet - stable. Could contribute to imbalance. 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 Likely incidental and not causing her current symptoms. She also has chronic microvascular ischemia and atrophy.    Plan: -Patient would like to continue Sinemet  1/2 tablet TID -Referral to ENT for dizziness as I am concerned this may be inner ear. Patient may need vestibular testing -Continue home vestibular therapy exercises -Fall precautions discussed   For secondary stroke prevention: -Continue aspirin 81 mg daily -Continue atorvastatin  80 mg daily   For neuropathy: -Continue gabapentin  as needed -Continue alpha lipoic acid 600 mg once or twice a day  Since their last visit: Patient stopped Sinemet  due to dizziness around 09/04/23, but then restarted it. She thinks she walks better when she takes it.  She saw ENT on 12/02/23. They did not think it was peripheral, but suggested if she continued to have symptoms, she may benefit from vestibular neurodiagnostic testing at a tertiary care center to evaluate for vestibular dysfunction.  She is still having dizziness, but this comes and goes. She thinks it is better or at least no worse. She  is noticing now that she is lightheaded and off balance, especially when getting up or turning (like in the kitchen). She has not fallen. She feels like she is maybe moving slowly but not sure if that is related to the numbness in her feet. She feels like she has a band around her feet. She will also occasionally feel sharp pains in her legs.  Current medications: -Sinemet  1/2 tablet 3 times a day (9-10 am, 2-3 pm, 8 pm) -aspirin 81 mg - when I think about it - takes it about half the week -Atorvastatin  80 mg daily -Alpha lipoic acid 600 mg, is taking 3 times daily -Meclizine  25 mg, taking it once a day - didn't know it was as needed  MEDICATIONS:  Outpatient Encounter Medications as of 02/28/2024  Medication Sig   Accu-Chek Softclix Lancets lancets USE TO CHECK CBG ONCE A DAY   atorvastatin  (LIPITOR) 80 MG tablet Take 1 tablet (80 mg total) by mouth daily.   blood glucose meter kit and supplies  Dispense based on patient and insurance preference. Check CBG once a day (FOR ICD-10 E10.9, E11.9).   carbidopa -levodopa  (SINEMET  IR) 25-100 MG tablet TAKE 1/2 (ONE-HALF) TABLET BY MOUTH THREE TIMES DAILY   esomeprazole  (NEXIUM ) 40 MG capsule Take 1 capsule by mouth once daily   gabapentin  (NEURONTIN ) 300 MG capsule Take 1 capsule (300 mg total) by mouth at bedtime as needed (nerve pain in feet).   glucose blood (ACCU-CHEK GUIDE) test strip USE TO CHECK CBG ONCE A DAY   hydrochlorothiazide  (HYDRODIURIL ) 25 MG tablet Take 0.5 tablets (12.5 mg total) by mouth daily. Pt reports she takes half of 25 mg tablet   losartan  (COZAAR ) 100 MG tablet Take 1 tablet (100 mg total) by mouth daily.   meclizine  (ANTIVERT ) 25 MG tablet Take 1 tablet (25 mg total) by mouth 2 (two) times daily as needed for dizziness.   Multiple Vitamin (MULTI-VITAMIN) tablet Take by mouth.   Omega-3 Fatty Acids (FISH OIL PO) Take by mouth as directed.   Probiotic Product (PROBIOTIC DAILY PO) Take by mouth.   Semaglutide ,0.25 or  0.5MG /DOS, (OZEMPIC , 0.25 OR 0.5 MG/DOSE,) 2 MG/3ML SOPN Inject 0.5 mg into the skin once a week.   valACYclovir  (VALTREX ) 1000 MG tablet Take 1 tablet (1,000 mg total) by mouth 2 (two) times daily. (Patient taking differently: Take 1,000 mg by mouth as needed.)   famotidine  (PEPCID ) 40 MG tablet Take 1 tablet by mouth once daily (Patient not taking: Reported on 02/28/2024)   No facility-administered encounter medications on file as of 02/28/2024.    PAST MEDICAL HISTORY: Past Medical History:  Diagnosis Date   Anemia    Arthritis    Colon polyps    CVA (cerebral vascular accident) (HCC)    CVA (cerebrovascular accident) (HCC)    L MCA   GERD (gastroesophageal reflux disease)    High cholesterol    Hypertension    Osteopenia    Palpitations     PAST SURGICAL HISTORY: Past Surgical History:  Procedure Laterality Date   CESAREAN SECTION      ALLERGIES: No Known Allergies  FAMILY HISTORY: Family History  Problem Relation Age of Onset   Hypertension Mother    Osteoporosis Mother    Breast cancer Neg Hx     SOCIAL HISTORY: Social History   Tobacco Use   Smoking status: Former   Smokeless tobacco: Former    Quit date: 04/09/1978  Vaping Use   Vaping status: Never Used  Substance Use Topics   Alcohol use: No   Drug use: No   Social History   Social History Narrative   Are you right handed or left handed? Right   Are you currently employed ? no   What is your current occupation?retired   Do you live at home alone? No    Who lives with you? husband   What type of home do you live in: 1 story or 2 story? one    Caffeine 1 soda a day       Objective:  Vital Signs:  BP 124/70   Pulse (!) 107   Wt 164 lb (74.4 kg)   SpO2 97%   BMI 27.29 kg/m   General: No acute distress.  Patient appears well-groomed.   Head:  Normocephalic/atraumatic Neck: supple, no paraspinal tenderness, full range of motion Back: No paraspinal tenderness Heart: regular rate and  rhythm Lungs: Clear to auscultation bilaterally. Vascular: No carotid bruits. Extremities: No edema. Deformity of left hand - unable to fully extend  fingers   Neurological Exam: Mental status: alert and oriented, speech fluent and not dysarthric, language intact.  Cranial nerves: CN I: not tested CN II: pupils equal, round and reactive to light, visual fields intact CN III, IV, VI:  full range of motion, no nystagmus, no ptosis CN V: facial sensation intact. CN VII: upper and lower face symmetric CN VIII: hearing intact CN IX, X: uvula midline CN XI: sternocleidomastoid and trapezius muscles intact CN XII: tongue midline  Bulk & Tone: normal Motor:  muscle strength 5/5 throughout Deep Tendon Reflexes:  2+ throughout, except trace at bilateral ankles.   Sensation:  Pinprick and vibratory sensation intact Finger to nose testing:  Without dysmetria.    Gait:  Reduced arm swing on right. Short, shuffling steps.  Pull test with significant imbalance.  Romberg with mild sway.   Labs and Imaging review: New results: 12/12/23: Lipid panel: tChol 147, LDL 87, TG 70 CMP unremarkable CBC w/ diff unremarkable HbA1c: 6.6  TSH (08/29/23): wnl  Previously reviewed results: HbA1c (04/16/23): 7.6 Lipid panel (04/16/23): tChol 161, LDL 95, TG 73   04/26/22: Zinc  wnl Copper  wnl Vit B6 wnl IFE: no M protein   Normal or unremarkable: CBC, CMP, folate B12 (03/07/22): 822 HbA1c (09/18/21): 6.6   Imaging: Lumbar xray (12/05/16): FINDINGS: The lumbar vertebral bodies are preserved in height. The disc space heights are well maintained. There is mild facet joint hypertrophy at L4-5. There are no pars defects in there is no spondylolisthesis. The pedicles and transverse processes are intact. The observed portions of the sacrum are normal. The visualized portions of the SI joints appear normal for age. There is calcification in the wall of the abdominal aorta.   IMPRESSION: Mild  degenerative facet joint change at L4-5. There is no compression fracture, spondylolisthesis, or high-grade disc space narrowing.   Abdominal aortic atherosclerosis.   Cervical spine xray (12/05/16): FINDINGS: Diffuse degenerative change. No acute bony or joint abnormality identified. No evidence of fracture dislocation. Pulmonary apices are clear .   IMPRESSION: Diffuse degenerative change.  No acute abnormality.   MRI brain w/wo contrast (12/13/22): IMPRESSION: 1. No acute intracranial abnormality. 2. 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 Moderate for age cerebral white matter and mild for age other signal changes in the bilateral brain, compatible with chronic small vessel disease.   Carotid ultrasound (01/21/23): IMPRESSION: Color duplex indicates no significant plaque, with no hemodynamically significant stenosis by duplex criteria in the extracranial cerebrovascular circulation.  Assessment/Plan:  This is CALLE SCHADER, a 78 y.o. 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 ENT did not see clear peripheral etiology though she has symptoms with quick head turns. Dizziness has perhaps improved a little though. The imbalance could be due to polyneuropathy, but I have long wondered due to her gait if she may have parkinsonism. She thinks she moves better on the very low dose of Sinemet  (1/2 tablet 3 times daily). I'm not for sure she has parkinsonism though. When discussing options, patient liked the idea of Datscan to get further evidence  Stroke - left frontal infarct seen on MRI from 12/2022. Likely incidental and not causing her current symptoms. She also has chronic microvascular ischemia and atrophy.   Plan: -Datscan -Patient would like to continue Sinemet  1/2 tablet TID for now -Patient to consider discussing further vestibular  testing with ENT if dizziness worsens (as per their note)  For secondary stroke prevention: -Continue aspirin 81 mg daily -Continue atorvastatin  80 mg daily  For possible neuropathy: -Could restart gabapentin  if needed -Continue alpha lipoic acid 600 mg once or twice a day  -Fall precautions discussed  Return to clinic in 6 months  Total time spent reviewing records, interview, history/exam, documentation, and coordination of care on day of encounter:  45 min  Venetia Potters, MD

## 2024-02-28 ENCOUNTER — Ambulatory Visit (INDEPENDENT_AMBULATORY_CARE_PROVIDER_SITE_OTHER): Admitting: Neurology

## 2024-02-28 ENCOUNTER — Encounter: Payer: Self-pay | Admitting: Neurology

## 2024-02-28 VITALS — BP 124/70 | HR 107 | Wt 164.0 lb

## 2024-02-28 DIAGNOSIS — E1140 Type 2 diabetes mellitus with diabetic neuropathy, unspecified: Secondary | ICD-10-CM

## 2024-02-28 DIAGNOSIS — I639 Cerebral infarction, unspecified: Secondary | ICD-10-CM

## 2024-02-28 DIAGNOSIS — R258 Other abnormal involuntary movements: Secondary | ICD-10-CM

## 2024-02-28 DIAGNOSIS — R42 Dizziness and giddiness: Secondary | ICD-10-CM | POA: Diagnosis not present

## 2024-02-28 DIAGNOSIS — R251 Tremor, unspecified: Secondary | ICD-10-CM

## 2024-02-28 NOTE — Patient Instructions (Addendum)
 Continue Sinemet  1/2 tablet 3 times daily for now. We will decide what to do with this after the special brain scan called DaT scan (see more information below).  -Continue aspirin 81 mg daily -Continue atorvastatin  80 mg daily -Continue alpha lipoic acid 600 mg once or twice a day  As we discussed, we are going to do a DaT scan.  We discussed that this is not a diagnostic scan, but will just give us  some information on dopamine levels in the brain.  Here is some information which may be helpful to you.  Before the Exam  Please tell the nurse, nuclear imaging technician or nuclear medicine physician if you are pregnant, nursing or have reduced liver function. Please also inform us  if you have an allergy or sensitivity to iodine.  The test may be completed with those who are allergic to iodine, but may require pre-medication with other medications to help avoid reactions. If you need to cancel the examination, please give us  at least 24 hours notice.  Before your scan, stop taking these medicines for the length of time shown: Name of Drug Stop Taking  Amoxapine 4 days before  Benztropine  Cogentin 3 days before  Bupropion (Aplenzin, Budeprion, Voxra, Wellbutrin, Zyban) 48 hours before  Buspirone 15 hours before  Citalopram 24 hours before  Cocaine 6 hours before  Escitalopram 24 hours before  Methamphetamine 24 hours before  Methylphenidate (Concerta, Metadate, Methylin, Ritalin) 20 hours before  Paroxetine 24 hours before  Selegilene 48 hours before  Sertraline 3 days before    On the Day of the Exam Drink plenty of fluids and go to the bathroom frequently (and for two days after your exam) Wear loose comfortable clothing, since you will need to lie still for a period of time. Please bring a list of all medications that you are taking; name and dosage. We want to make your waiting time as pleasant as possible. Consider bringing your favorite magazine, book or music player to help  you pass the time.  You do not need to stay at the imaging facility the entire time, between the initial injection and the scan itself.   Please leave your jewelry and valuables at home.  During the Exam The DaTscan once started takes approximately 30-45 minutes. However, following injection of the DaT agent approximately 3-6 hours are required before the agent has achieved appropriate concentration in the brain.  We will inject the DaTscan through an intravenous (IV) line into your arm in the AM, usually around 8-9am, and then you will come back usually in the mid afternoon for the scan. Before the exam, you will receive a drug to allow you to protect the thyroid . For the imaging test, you will be asked to lie on a table and an imaging technologist will position your head in a headrest. A strip of tape or a flexible restraint may be placed around your head to help you to not move your head during the scan. A camera will be positioned above you and you must remain very still for about 30 minute while images are taken. The scanner will be very close to your head, but will not touch your head.   Preventing Falls at [facility] are common, often dreaded events in the lives of older people. Aside from the obvious injuries and even death that may result, fall can cause wide-ranging consequences including loss of independence, mental decline, decreased activity and mobility. Younger people are also at risk of  falling, especially those with chronic illnesses and fatigue.  Ways to reduce risk for falling Examine diet and medications. Warm foods and alcohol dilate blood vessels, which can lead to dizziness when standing. Sleep aids, antidepressants and pain medications can also increase the likelihood of a fall.  Get a vision exam. Poor vision, cataracts and glaucoma increase the chances of falling.  Check foot gear. Shoes should fit snugly and have a sturdy, nonskid sole and a broad, low  heel  Participate in a physician-approved exercise program to build and maintain muscle strength and improve balance and coordination. Programs that use ankle weights or stretch bands are excellent for muscle-strengthening. Water aerobics programs and low-impact Tai Chi programs have also been shown to improve balance and coordination.  Increase vitamin D intake. Vitamin D improves muscle strength and increases the amount of calcium  the body is able to absorb and deposit in bones.  How to prevent falls from common hazards Floors - Remove all loose wires, cords, and throw rugs. Minimize clutter. Make sure rugs are anchored and smooth. Keep furniture in its usual place.  Chairs -- Use chairs with straight backs, armrests and firm seats. Add firm cushions to existing pieces to add height.  Bathroom - Install grab bars and non-skid tape in the tub or shower. Use a bathtub transfer bench or a shower chair with a back support Use an elevated toilet seat and/or safety rails to assist standing from a low surface. Do not use towel racks or bathroom tissue holders to help you stand.  Lighting - Make sure halls, stairways, and entrances are well-lit. Install a night light in your bathroom or hallway. Make sure there is a light switch at the top and bottom of the staircase. Turn lights on if you get up in the middle of the night. Make sure lamps or light switches are within reach of the bed if you have to get up during the night.  Kitchen - Install non-skid rubber mats near the sink and stove. Clean spills immediately. Store frequently used utensils, pots, pans between waist and eye level. This helps prevent reaching and bending. Sit when getting things out of lower cupboards.  Living room/ Bedrooms - Place furniture with wide spaces in between, giving enough room to move around. Establish a route through the living room that gives you something to hold onto as you walk.  Stairs - Make sure treads, rails, and  rugs are secure. Install a rail on both sides of the stairs. If stairs are a threat, it might be helpful to arrange most of your activities on the lower level to reduce the number of times you must climb the stairs.  Entrances and doorways - Install metal handles on the walls adjacent to the doorknobs of all doors to make it more secure as you travel through the doorway.  Tips for maintaining balance Keep at least one hand free at all times. Try using a backpack or fanny pack to hold things rather than carrying them in your hands. Never carry objects in both hands when walking as this interferes with keeping your balance.  Attempt to swing both arms from front to back while walking. This might require a conscious effort if Parkinson's disease has diminished your movement. It will, however, help you to maintain balance and posture, and reduce fatigue.  Consciously lift your feet off of the ground when walking. Shuffling and dragging of the feet is a common culprit in losing your balance.  When trying  to navigate turns, use a U technique of facing forward and making a wide turn, rather than pivoting sharply.  Try to stand with your feet shoulder-length apart. When your feet are close together for any length of time, you increase your risk of losing your balance and falling.  Do one thing at a time. Don't try to walk and accomplish another task, such as reading or looking around. The decrease in your automatic reflexes complicates motor function, so the less distraction, the better.  Do not wear rubber or gripping soled shoes, they might catch on the floor and cause tripping.  Move slowly when changing positions. Use deliberate, concentrated movements and, if needed, use a grab bar or walking aid. Count 15 seconds between each movement. For example, when rising from a seated position, wait 15 seconds after standing to begin walking.  If balance is a continuous problem, you might want to  consider a walking aid such as a cane, walking stick, or walker. Once you've mastered walking with help, you might be ready to try it on your own again.   The physicians and staff at [facility] are committed to providing excellent care. You may receive a survey requesting feedback about your experience at our office. We strive to receive very good responses to the survey questions. If you feel that your experience would prevent you from giving the office a very good  response, please contact our office to try to remedy the situation. We may be reached at [phone]. Thank you for taking the time out of your busy day to complete the survey.  Venetia Potters, MD [facility]

## 2024-03-13 NOTE — Telephone Encounter (Signed)
 SABRA

## 2024-03-25 ENCOUNTER — Telehealth: Payer: Self-pay | Admitting: Neurology

## 2024-03-25 NOTE — Telephone Encounter (Signed)
 Called pt and made her aware that I am checking on the DAT SCAN

## 2024-03-25 NOTE — Telephone Encounter (Signed)
 Nina Olson called in wanting to know if she was scheduled for a test, concerning dizziness and Parkinson's. She stated the office would set the test up and she has not received a call that it was set up yet.   PH: [phone]

## 2024-03-26 NOTE — Telephone Encounter (Signed)
 Order going out today, sent to [facility]

## 2024-03-27 ENCOUNTER — Other Ambulatory Visit: Payer: Self-pay

## 2024-03-27 ENCOUNTER — Telehealth: Payer: Self-pay

## 2024-03-27 ENCOUNTER — Other Ambulatory Visit: Payer: Self-pay | Admitting: Family Medicine

## 2024-03-27 DIAGNOSIS — R251 Tremor, unspecified: Secondary | ICD-10-CM

## 2024-03-27 DIAGNOSIS — K219 Gastro-esophageal reflux disease without esophagitis: Secondary | ICD-10-CM

## 2024-03-27 NOTE — Telephone Encounter (Signed)
 Pt No Prior Authorization is required for the Dat Scan

## 2024-03-30 NOTE — Telephone Encounter (Signed)
 Nina Olson

## 2024-04-06 ENCOUNTER — Other Ambulatory Visit: Payer: Self-pay | Admitting: Neurology

## 2024-04-06 DIAGNOSIS — G20C Parkinsonism, unspecified: Secondary | ICD-10-CM

## 2024-04-17 ENCOUNTER — Other Ambulatory Visit (HOSPITAL_COMMUNITY)

## 2024-04-17 ENCOUNTER — Encounter (HOSPITAL_COMMUNITY)

## 2024-04-20 ENCOUNTER — Telehealth: Payer: Self-pay | Admitting: Neurology

## 2024-04-20 ENCOUNTER — Telehealth: Payer: Self-pay

## 2024-04-20 NOTE — Telephone Encounter (Signed)
 As we discussed, we are going to do a DaT scan.  We discussed that this is not a diagnostic scan, but will just give us  some information on dopamine levels in the brain.  Here is some information which may be helpful to you.  Before the Exam  Please tell the nurse, nuclear imaging technician or nuclear medicine physician if you are pregnant, nursing or have reduced liver function. Please also inform us  if you have an allergy or sensitivity to iodine .  The test may be completed with those who are allergic to iodine , but may require pre-medication with other medications to help avoid reactions. If you need to cancel the examination, please give us  at least 24 hours notice.  Before your scan, stop taking these medicines for the length of time shown: Name of Drug Stop Taking  Amoxapine 4 days before  Benztropine  Cogentin 3 days before  Bupropion  (Aplenzin , Budeprion, Voxra, Wellbutrin , Zyban ) 8 days before  Buspirone 15 hours before  Citalopram 24 hours before  Cocaine 6 hours before  Escitalopram  24 hours before  Methamphetamine 24 hours before  Methylphenidate (Concerta, Metadate, Methylin, Ritalin) 20 hours before  Paroxetine 24 hours before  Selegilene 48 hours before  Sertraline 3 days before    On the Day of the Exam Drink plenty of fluids and go to the bathroom frequently (and for two days after your exam) Wear loose comfortable clothing, since you will need to lie still for a period of time. Please bring a list of all medications that you are taking; name and dosage. We want to make your waiting time as pleasant as possible. Consider bringing your favorite magazine, book or music player to help you pass the time.  You do not need to stay at the imaging facility the entire time, between the initial injection and the scan itself.   Please leave your jewelry and valuables at home.  During the Exam The DaTscan once started takes approximately 30-45 minutes. However, following  injection of the DaT agent approximately 3-6 hours are required before the agent has achieved appropriate concentration in the brain.  We will inject the DaTscan through an intravenous (IV) line into your arm in the AM, usually around 8-9am, and then you will come back usually in the mid afternoon for the scan. Before the exam, you will receive a drug to allow you to protect the thyroid . For the imaging test, you will be asked to lie on a table and an imaging technologist will position your head in a headrest. A strip of tape or a flexible restraint may be placed around your head to help you to not move your head during the scan. A camera will be positioned above you and you must remain very still for about 30 minute while images are taken. The scanner will be very close to your head, but will not touch your head.

## 2024-04-20 NOTE — Telephone Encounter (Signed)
 Patient has questions about the DaT scan she is having, she needs to know where she will go and have it done at

## 2024-04-21 ENCOUNTER — Encounter (HOSPITAL_COMMUNITY): Payer: Self-pay

## 2024-04-21 ENCOUNTER — Encounter (HOSPITAL_COMMUNITY)
Admission: RE | Admit: 2024-04-21 | Discharge: 2024-04-21 | Disposition: A | Discharge status: Home / Self Care | Source: Ambulatory Visit | Attending: Neurology | Admitting: Neurology

## 2024-04-21 DIAGNOSIS — R251 Tremor, unspecified: Secondary | ICD-10-CM | POA: Insufficient documentation

## 2024-04-21 MED ORDER — IOFLUPANE I 123 185 MBQ/2.5ML IV SOLN
5.0000 | Freq: Once | INTRAVENOUS | Status: AC
  Administered 2024-04-21: 4.51 via INTRAVENOUS
  Filled 2024-04-21: qty 5

## 2024-04-21 NOTE — Telephone Encounter (Signed)
 Called pt and she had already call the hospital and received the information from them.

## 2024-04-22 ENCOUNTER — Encounter: Payer: Self-pay | Admitting: Neurology

## 2024-04-22 ENCOUNTER — Telehealth: Payer: Self-pay | Admitting: Neurology

## 2024-04-22 NOTE — Telephone Encounter (Signed)
 Called patient to discuss the results of her DATSCAN . This was normal. While there were possible signs of parkinsonism on exam, this result argues against this.  She continues to have daily dizziness without clear etiology. I recommended she stop Sinemet  as she likely does not need it and it could contribute to symptoms.  If she continues to be dizzy, I do think vestibular testing would be reasonable. She will reach out to ENT that she has seen previously to inquire about this.  All questions were answered.  Nina Potters, MD [facility]

## 2024-04-23 ENCOUNTER — Telehealth (INDEPENDENT_AMBULATORY_CARE_PROVIDER_SITE_OTHER): Payer: Self-pay | Admitting: Otolaryngology

## 2024-04-23 NOTE — Telephone Encounter (Signed)
 The patient called in, she wants further testing for her dizziness.  She is dizzy daily and Dr Leigh told her there should be another test she can have.  Please advise the patient on how to go forward for more testing. She is unsure of the name of this testing.

## 2024-04-24 ENCOUNTER — Other Ambulatory Visit (INDEPENDENT_AMBULATORY_CARE_PROVIDER_SITE_OTHER): Payer: Self-pay | Admitting: Otolaryngology

## 2024-04-24 DIAGNOSIS — R42 Dizziness and giddiness: Secondary | ICD-10-CM

## 2024-04-24 NOTE — Telephone Encounter (Signed)
 Returned patient's phone call and told her that Dr. Karis put in a referral to [facility] to see an Otologist. She understood.

## 2024-04-27 ENCOUNTER — Ambulatory Visit: Admitting: Family Medicine

## 2024-04-27 ENCOUNTER — Encounter: Payer: Self-pay | Admitting: Family Medicine

## 2024-04-27 ENCOUNTER — Ambulatory Visit: Payer: Self-pay

## 2024-04-27 VITALS — BP 130/62 | HR 100 | Temp 97.8°F | Ht 65.0 in | Wt 168.0 lb

## 2024-04-27 DIAGNOSIS — G8929 Other chronic pain: Secondary | ICD-10-CM | POA: Diagnosis not present

## 2024-04-27 DIAGNOSIS — M25561 Pain in right knee: Secondary | ICD-10-CM | POA: Diagnosis not present

## 2024-04-27 NOTE — Progress Notes (Signed)
 "  Subjective:    Patient ID: Nina Olson, female    DOB: [birth year], 79 y.o.   MRN: [id number] 01/23/24 Patient presents today with pain in her right knee.  She states has been hurting for the last 2 weeks.  She started doing water aerobics although she denies any specific injury.  She states that it hurts to stand and walk.  Also hurts to extend the knee.  The pain is primarily over the medial joint line.  There is a slight effusion in that area.  There is no erythema or warmth.  She has tried Celebrex  without relief.  At that time, my plan was: Based on her exam, I believe the patient likely has knee pain due to osteoarthritis.  We discussed trying NSAIDs for longer however she request a cortisone injection.  Using sterile technique, I injected the right knee with 2 cc of lidocaine, 2 cc of Marcaine , and 2 cc of 40 mg/mL Kenalog .  The patient tolerated the procedure well without complication.  04/27/2024 Patient states that the cortisone injection in her knee lasted approximately 2 months but then the pain started returning.  Today she reports pain over both the medial and lateral joint lines.  Is not debilitating.  Is not on a daily basis.  She will have occasional sharp pains directly over the medial compartment with standing or squatting or going up and down steps.  She denies any laxity to varus or valgus stress or instability in the knee.  She denies any effusions.  There is no erythema or warmth. Past Medical History:  Diagnosis Date   Anemia    Arthritis    Colon polyps    CVA (cerebral vascular accident) (HCC)    CVA (cerebrovascular accident) (HCC)    L MCA   GERD (gastroesophageal reflux disease)    High cholesterol    Hypertension    Osteopenia    Palpitations    Past Surgical History:  Procedure Laterality Date   CESAREAN SECTION     Current Outpatient Medications on File Prior to Visit  Medication Sig Dispense Refill   Accu-Chek Softclix Lancets lancets USE TO CHECK CBG  ONCE A DAY 100 each 0   atorvastatin  (LIPITOR) 80 MG tablet Take 1 tablet (80 mg total) by mouth daily. 90 tablet 1   blood glucose meter kit and supplies Dispense based on patient and insurance preference. Check CBG once a day (FOR ICD-10 E10.9, E11.9). 1 each 0   esomeprazole  (NEXIUM ) 40 MG capsule Take 1 capsule by mouth once daily 90 capsule 0   famotidine  (PEPCID ) 40 MG tablet Take 1 tablet by mouth once daily (Patient not taking: Reported on 02/28/2024) 90 tablet 1   gabapentin  (NEURONTIN ) 300 MG capsule Take 1 capsule (300 mg total) by mouth at bedtime as needed (nerve pain in feet). 30 capsule 3   glucose blood (ACCU-CHEK GUIDE) test strip USE TO CHECK CBG ONCE A DAY 100 each 6   hydrochlorothiazide  (HYDRODIURIL ) 25 MG tablet Take 0.5 tablets (12.5 mg total) by mouth daily. Pt reports she takes half of 25 mg tablet 90 tablet 3   losartan  (COZAAR ) 100 MG tablet Take 1 tablet (100 mg total) by mouth daily. 90 tablet 1   meclizine  (ANTIVERT ) 25 MG tablet Take 1 tablet (25 mg total) by mouth 2 (two) times daily as needed for dizziness. 30 tablet 0   Multiple Vitamin (MULTI-VITAMIN) tablet Take by mouth.     Omega-3 Fatty Acids (FISH OIL PO)  Take by mouth as directed.     Probiotic Product (PROBIOTIC DAILY PO) Take by mouth.     Semaglutide ,0.25 or 0.5MG /DOS, (OZEMPIC , 0.25 OR 0.5 MG/DOSE,) 2 MG/3ML SOPN Inject 0.5 mg into the skin once a week. 3 mL 11   valACYclovir  (VALTREX ) 1000 MG tablet Take 1 tablet (1,000 mg total) by mouth 2 (two) times daily. (Patient taking differently: Take 1,000 mg by mouth as needed.) 14 tablet 5   No current facility-administered medications on file prior to visit.   No Known Allergies Social History   Socioeconomic History   Marital status: Married    Spouse name: Not on file   Number of children: 3   Years of education: Not on file   Highest education level: Associate degree: academic program  Occupational History   Not on file  Tobacco Use   Smoking  status: Former   Smokeless tobacco: Former    Quit date: 04/09/1978  Vaping Use   Vaping status: Never Used  Substance and Sexual Activity   Alcohol use: No   Drug use: No   Sexual activity: Never  Other Topics Concern   Not on file  Social History Narrative   Are you right handed or left handed? Right   Are you currently employed ? no   What is your current occupation?retired   Do you live at home alone? No    Who lives with you? husband   What type of home do you live in: 1 story or 2 story? one    Caffeine 1 soda a day    Social Drivers of Health   Tobacco Use: Medium Risk (04/27/2024)   Patient History    Smoking Tobacco Use: Former    Smokeless Tobacco Use: Former    Passive Exposure: Not on Actuary Strain: Low Risk (01/22/2024)   Overall Financial Resource Strain (CARDIA)    Difficulty of Paying Living Expenses: Not very hard  Food Insecurity: No Food Insecurity (01/22/2024)   Epic    Worried About Programme Researcher, Broadcasting/film/video in the Last Year: Never true    Ran Out of Food in the Last Year: Never true  Transportation Needs: No Transportation Needs (01/22/2024)   Epic    Lack of Transportation (Medical): No    Lack of Transportation (Non-Medical): No  Physical Activity: Inactive (04/25/2023)   Exercise Vital Sign    Days of Exercise per Week: 0 days    Minutes of Exercise per Session: 0 min  Stress: No Stress Concern Present (04/25/2023)   [employer] of Occupational Health - Occupational Stress Questionnaire    Feeling of Stress : Not at all  Social Connections: Socially Integrated (01/22/2024)   Social Connection and Isolation Panel    Frequency of Communication with Friends and Family: More than three times a week    Frequency of Social Gatherings with Friends and Family: Three times a week    Attends Religious Services: More than 4 times per year    Active Member of Clubs or Organizations: Yes    Attends Banker Meetings: More than 4  times per year    Marital Status: Married  Catering Manager Violence: Not At Risk (04/25/2023)   Humiliation, Afraid, Rape, and Kick questionnaire    Fear of Current or Ex-Partner: No    Emotionally Abused: No    Physically Abused: No    Sexually Abused: No  Depression (PHQ2-9): Low Risk (12/12/2023)   Depression (PHQ2-9)  PHQ-2 Score: 0  Alcohol Screen: Low Risk (04/25/2023)   Alcohol Screen    Last Alcohol Screening Score (AUDIT): 0  Housing: Low Risk (01/22/2024)   Epic    Unable to Pay for Housing in the Last Year: No    Number of Times Moved in the Last Year: 0    Homeless in the Last Year: No  Utilities: Not At Risk (04/25/2023)   [employer] Utilities    Threatened with loss of utilities: No  Health Literacy: Adequate Health Literacy (04/25/2023)   B1300 Health Literacy    Frequency of need for help with medical instructions: Never     Review of Systems  All other systems reviewed and are negative.      Objective:   Physical Exam Constitutional:      Appearance: Normal appearance. She is normal weight.  Cardiovascular:     Rate and Rhythm: Normal rate and regular rhythm.     Pulses: Normal pulses.     Heart sounds: Normal heart sounds. No murmur heard.    No friction rub. No gallop.  Pulmonary:     Effort: Pulmonary effort is normal. No respiratory distress.     Breath sounds: Normal breath sounds. No stridor. No wheezing, rhonchi or rales.  Musculoskeletal:     Right knee: Decreased range of motion. Tenderness present over the medial joint line.     Right lower leg: No edema.     Left lower leg: No edema.  Neurological:     General: No focal deficit present.     Mental Status: She is alert and oriented to person, place, and time.          Chronic pain of right knee - Plan: DG Knee Complete 4 Views Right  Patient's knee pain is due to osteoarthritis I believe.  Recommended an x-ray of the knee to evaluate further.  If osteoarthritis is confirmed, she can take  daily NSAIDs such as Celebrex  or meloxicam , she can receive cortisone injections every 3 months, or she could see orthopedics to discuss knee replacement.  At the present time, the patient states that the pain is not bad enough to warrant any of these 3 options.  Instead she will use Tylenol  with topical Voltaren for relief "

## 2024-04-27 NOTE — Telephone Encounter (Signed)
 FYI Only or Action Required?: FYI only for provider: appointment scheduled on today.  Patient was last seen in primary care on 01/23/2024 by Duanne Butler DASEN, MD.  Called Nurse Triage reporting Knee Problem.  Symptoms began several weeks ago.  Interventions attempted: Nothing.  Symptoms are: unchanged.  Triage Disposition: See PCP When Office is Open (Within 3 Days)  Patient/caregiver understands and will follow disposition?: Yes, will follow disposition  Reason for Triage: Swollen right knee, painful at times.    Reason for Disposition  MILD or MODERATE swelling (e.g., can't move joint normally, can't do usual activities) (Exceptions: Itchy, localized swelling; swelling is chronic.)  Answer Assessment - Initial Assessment Questions 1. LOCATION: Where is the swelling located?  (e.g., left, right, both knees)     R knee 2. ONSET: When did the swelling start? Does it come and go, or is it there all the time?     2 weeks 3. SWELLING: How bad is the swelling? Or, How large is it? (e.g., mild, moderate, severe; size of localized swelling)      Localized, not real bad 4. PAIN: Is there any pain? If Yes, ask: How bad is it? (Scale 0-10; or none, mild, moderate, severe)     Intermittent, currently does not hurt, states at time can be 7 5. SETTING: Has there been any recent work, exercise or other activity that involved that part of the body?      Denies. Hx of fluid on the knee 6. AGGRAVATING FACTORS: What makes the knee swelling worse? (e.g., walking, climbing stairs, running)     Standing/using it for long periods of time 7. ASSOCIATED SYMPTOMS: Is there any pain or redness?     Denies redness 8. OTHER SYMPTOMS: Do you have any other symptoms? (e.g., calf pain, chest pain, difficulty breathing, fever)     denies  Protocols used: Knee Swelling-A-AH

## 2024-04-28 ENCOUNTER — Encounter (INDEPENDENT_AMBULATORY_CARE_PROVIDER_SITE_OTHER): Payer: Self-pay

## 2024-04-28 ENCOUNTER — Ambulatory Visit
Admission: RE | Admit: 2024-04-28 | Discharge: 2024-04-28 | Disposition: A | Source: Ambulatory Visit | Attending: Family Medicine | Admitting: Family Medicine

## 2024-04-28 DIAGNOSIS — G8929 Other chronic pain: Secondary | ICD-10-CM

## 2024-04-29 ENCOUNTER — Ambulatory Visit: Payer: HMO

## 2024-04-29 VITALS — Ht 65.0 in | Wt 168.0 lb

## 2024-04-29 DIAGNOSIS — Z0000 Encounter for general adult medical examination without abnormal findings: Secondary | ICD-10-CM

## 2024-04-29 NOTE — Patient Instructions (Signed)
 Ms. Mckeen,  Thank you for taking the time for your Medicare Wellness Visit. I appreciate your continued commitment to your health goals. Please review the care plan we discussed, and feel free to reach out if I can assist you further.  Please note that Annual Wellness Visits do not include a physical exam. Some assessments may be limited, especially if the visit was conducted virtually. If needed, we may recommend an in-person follow-up with your provider.  Ongoing Care Seeing your primary care provider every 3 to 6 months helps us  monitor your health and provide consistent, personalized care.   Referrals If a referral was made during today's visit and you haven't received any updates within two weeks, please contact the referred provider directly to check on the status.  Recommended Screenings:  Health Maintenance  Topic Date Due   COVID-19 Vaccine (5 - 2025-26 season) 12/09/2023   Kidney health urinalysis for diabetes  04/15/2024   Complete foot exam   04/15/2024   Eye exam for diabetics  08/19/2024*   DTaP/Tdap/Td vaccine (2 - Td or Tdap) 05/26/2024   Hemoglobin A1C  06/10/2024   Yearly kidney function blood test for diabetes  12/11/2024   Medicare Annual Wellness Visit  04/29/2025   Pneumococcal Vaccine for age over 38  Completed   Flu Shot  Completed   Osteoporosis screening with Bone Density Scan  Completed   Hepatitis C Screening  Completed   Zoster (Shingles) Vaccine  Completed   Meningitis B Vaccine  Aged Out   Breast Cancer Screening  Discontinued   Colon Cancer Screening  Discontinued  *Topic was postponed. The date shown is not the original due date.       04/29/2024   10:31 AM  Advanced Directives  Does Patient Have a Medical Advance Directive? No  Would patient like information on creating a medical advance directive? Yes (MAU/Ambulatory/Procedural Areas - Information given)   Information on Advanced Care Planning can be found at [place]  Secretary of [facility]  Advance Health Care Directives Advance Health Care Directives ([email or web address]   Vision: Annual vision screenings are recommended for early detection of glaucoma, cataracts, and diabetic retinopathy. These exams can also reveal signs of chronic conditions such as diabetes and high blood pressure.  Dental: Annual dental screenings help detect early signs of oral cancer, gum disease, and other conditions linked to overall health, including heart disease and diabetes.  Please see the attached documents for additional preventive care recommendations.

## 2024-04-29 NOTE — Progress Notes (Signed)
 "  Chief Complaint  Patient presents with   Medicare Wellness     Subjective:   Nina Olson is a 79 y.o. female who presents for a Medicare Annual Wellness Visit.  Visit info / Clinical Intake: Medicare Wellness Visit Type:: Subsequent Annual Wellness Visit Persons participating in visit and providing information:: patient Medicare Wellness Visit Mode:: Telephone If telephone:: video declined Since this visit was completed virtually, some vitals may be partially provided or unavailable. Missing vitals are due to the limitations of the virtual format.: Documented vitals are patient reported If Telephone or Video please confirm:: I connected with patient using audio/video enable telemedicine. I verified patient identity with two identifiers, discussed telehealth limitations, and patient agreed to proceed. Patient Location:: home Provider Location:: office Interpreter Needed?: No Pre-visit prep was completed: yes AWV questionnaire completed by patient prior to visit?: no Living arrangements:: lives with spouse/significant other Patient's Overall Health Status Rating: good Typical amount of pain: some Does pain affect daily life?: no Are you currently prescribed opioids?: no  Dietary Habits and Nutritional Risks How many meals a day?: 3 Eats fruit and vegetables daily?: yes Most meals are obtained by: preparing own meals In the last 2 weeks, have you had any of the following?: none Diabetic:: (!) yes Any non-healing wounds?: no How often do you check your BS?: 1 Would you like to be referred to a Nutritionist or for Diabetic Management? : no  Functional Status Activities of Daily Living (to include ambulation/medication): Independent Ambulation: Independent Medication Administration: Independent Home Management (perform basic housework or laundry): Independent Manage your own finances?: yes Primary transportation is: driving Concerns about vision?: no *vision screening is  required for WTM* Concerns about hearing?: no  Fall Screening Falls in the past year?: 0 Number of falls in past year: 0 Was there an injury with Fall?: 0 Fall Risk Category Calculator: 0 Patient Fall Risk Level: Low Fall Risk  Fall Risk Patient at Risk for Falls Due to: Impaired mobility Fall risk Follow up: Falls prevention discussed; Falls evaluation completed; Education provided  Home and Transportation Safety: All rugs have non-skid backing?: yes All stairs or steps have railings?: yes Grab bars in the bathtub or shower?: yes Have non-skid surface in bathtub or shower?: yes Good home lighting?: yes Regular seat belt use?: yes Hospital stays in the last year:: no  Cognitive Assessment Difficulty concentrating, remembering, or making decisions? : no Will 6CIT or Mini Cog be Completed: no 6CIT or Mini Cog Declined: patient alert, oriented, able to answer questions appropriately and recall recent events  Advance Directives (For Healthcare) Does Patient Have a Medical Advance Directive?: No Would patient like information on creating a medical advance directive?: Yes (MAU/Ambulatory/Procedural Areas - Information given)  Reviewed/Updated  Reviewed/Updated: Reviewed All (Medical, Surgical, Family, Medications, Allergies, Care Teams, Patient Goals)    Allergies (verified) Patient has no known allergies.   Current Medications (verified) Outpatient Encounter Medications as of 04/29/2024  Medication Sig   Accu-Chek Softclix Lancets lancets USE TO CHECK CBG ONCE A DAY   atorvastatin  (LIPITOR) 80 MG tablet Take 1 tablet (80 mg total) by mouth daily.   blood glucose meter kit and supplies Dispense based on patient and insurance preference. Check CBG once a day (FOR ICD-10 E10.9, E11.9).   esomeprazole  (NEXIUM ) 40 MG capsule Take 1 capsule by mouth once daily   gabapentin  (NEURONTIN ) 300 MG capsule Take 1 capsule (300 mg total) by mouth at bedtime as needed (nerve pain in feet).  glucose blood (ACCU-CHEK GUIDE) test strip USE TO CHECK CBG ONCE A DAY   hydrochlorothiazide  (HYDRODIURIL ) 25 MG tablet Take 0.5 tablets (12.5 mg total) by mouth daily. Pt reports she takes half of 25 mg tablet   losartan  (COZAAR ) 100 MG tablet Take 1 tablet (100 mg total) by mouth daily.   meclizine  (ANTIVERT ) 25 MG tablet Take 1 tablet (25 mg total) by mouth 2 (two) times daily as needed for dizziness.   Multiple Vitamin (MULTI-VITAMIN) tablet Take by mouth.   Omega-3 Fatty Acids (FISH OIL PO) Take by mouth as directed.   Probiotic Product (PROBIOTIC DAILY PO) Take by mouth.   Semaglutide ,0.25 or 0.5MG /DOS, (OZEMPIC , 0.25 OR 0.5 MG/DOSE,) 2 MG/3ML SOPN Inject 0.5 mg into the skin once a week.   valACYclovir  (VALTREX ) 1000 MG tablet Take 1 tablet (1,000 mg total) by mouth 2 (two) times daily. (Patient taking differently: Take 1,000 mg by mouth as needed.)   famotidine  (PEPCID ) 40 MG tablet Take 1 tablet by mouth once daily (Patient not taking: Reported on 02/28/2024)   No facility-administered encounter medications on file as of 04/29/2024.    History: Past Medical History:  Diagnosis Date   Anemia    Arthritis    Colon polyps    CVA (cerebral vascular accident) (HCC)    CVA (cerebrovascular accident) (HCC)    L MCA   GERD (gastroesophageal reflux disease)    High cholesterol    Hypertension    Osteopenia    Palpitations    Past Surgical History:  Procedure Laterality Date   CESAREAN SECTION     Family History  Problem Relation Age of Onset   Hypertension Mother    Osteoporosis Mother    Breast cancer Neg Hx    Social History   Occupational History   Not on file  Tobacco Use   Smoking status: Former   Smokeless tobacco: Former    Quit date: 04/09/1978  Vaping Use   Vaping status: Never Used  Substance and Sexual Activity   Alcohol use: No   Drug use: No   Sexual activity: Never   Tobacco Counseling Counseling given: Not Answered  SDOH Screenings   Food  Insecurity: No Food Insecurity (01/22/2024)  Housing: Low Risk (01/22/2024)  Transportation Needs: No Transportation Needs (01/22/2024)  Utilities: Not At Risk (04/25/2023)  Alcohol Screen: Low Risk (04/25/2023)  Depression (PHQ2-9): Low Risk (04/29/2024)  Financial Resource Strain: Low Risk (01/22/2024)  Physical Activity: Inactive (04/29/2024)  Social Connections: Socially Integrated (04/29/2024)  Stress: No Stress Concern Present (04/29/2024)  Tobacco Use: Medium Risk (04/29/2024)  Health Literacy: Adequate Health Literacy (04/25/2023)   See flowsheets for full screening details  Depression Screen PHQ 2 & 9 Depression Scale- Over the past 2 weeks, how often have you been bothered by any of the following problems? Little interest or pleasure in doing things: 0 Feeling down, depressed, or hopeless (PHQ Adolescent also includes...irritable): 0 PHQ-2 Total Score: 0     Goals Addressed             This Visit's Progress    Prevent falls               Objective:    Today's Vitals   04/29/24 1030  Weight: 168 lb (76.2 kg)  Height: 5' 5 (1.651 m)   Body mass index is 27.96 kg/m.  Hearing/Vision screen No results found. Immunizations and Health Maintenance Health Maintenance  Topic Date Due   COVID-19 Vaccine (5 - 2025-26 season) 12/09/2023  Diabetic kidney evaluation - Urine ACR  04/15/2024   FOOT EXAM  04/15/2024   OPHTHALMOLOGY EXAM  08/19/2024 (Originally 12/27/2022)   DTaP/Tdap/Td (2 - Td or Tdap) 05/26/2024   HEMOGLOBIN A1C  06/10/2024   Diabetic kidney evaluation - eGFR measurement  12/11/2024   Medicare Annual Wellness (AWV)  04/29/2025   Pneumococcal Vaccine: 50+ Years  Completed   Influenza Vaccine  Completed   Bone Density Scan  Completed   Hepatitis C Screening  Completed   Zoster Vaccines- Shingrix   Completed   Meningococcal B Vaccine  Aged Out   Mammogram  Discontinued   Colonoscopy  Discontinued        Assessment/Plan:  This is a routine  wellness examination for [place].  Patient Care Team: Duanne Butler DASEN, MD as PCP - General (Family Medicine) Leigh Venetia CROME, MD as Consulting Physician (Neurology) [place] GAILS, DO as Consulting Physician (Gastroenterology) Silva Juliene SAUNDERS, DPM as Consulting Physician (Podiatry)  I have personally reviewed and noted the following in the patients chart:   Medical and social history Use of alcohol, tobacco or illicit drugs  Current medications and supplements including opioid prescriptions. Functional ability and status Nutritional status Physical activity Advanced directives List of other physicians Hospitalizations, surgeries, and ER visits in previous 12 months Vitals Screenings to include cognitive, depression, and falls Referrals and appointments  No orders of the defined types were placed in this encounter.  In addition, I have reviewed and discussed with patient certain preventive protocols, quality metrics, and best practice recommendations. A written personalized care plan for preventive services as well as general preventive health recommendations were provided to patient.   Lavelle Charmaine Browner, LPN   [id number]/78/7973   Return in 1 year (on 04/29/2025).  After Visit Summary: (MyChart) Due to this being a telephonic visit, the after visit summary with patients personalized plan was offered to patient via MyChart   Nurse Notes: No voiced or noted concerns at this time Patient advised to keep follow-up appointment with PCP (05/14/24)  "

## 2024-05-05 ENCOUNTER — Ambulatory Visit: Payer: Self-pay | Admitting: Family Medicine

## 2024-05-06 ENCOUNTER — Other Ambulatory Visit: Payer: Self-pay

## 2024-05-06 DIAGNOSIS — M1711 Unilateral primary osteoarthritis, right knee: Secondary | ICD-10-CM

## 2024-05-06 DIAGNOSIS — M25561 Pain in right knee: Secondary | ICD-10-CM

## 2024-05-06 NOTE — Progress Notes (Signed)
 Spoke with pt. She is agreeable to MRI.

## 2024-05-14 ENCOUNTER — Encounter: Payer: Self-pay | Admitting: Family Medicine

## 2024-05-14 ENCOUNTER — Ambulatory Visit: Admitting: Family Medicine

## 2024-05-14 ENCOUNTER — Ambulatory Visit

## 2024-05-14 VITALS — BP 136/78 | HR 90 | Temp 97.6°F | Ht 65.0 in | Wt 166.0 lb

## 2024-05-14 DIAGNOSIS — D126 Benign neoplasm of colon, unspecified: Secondary | ICD-10-CM

## 2024-05-14 DIAGNOSIS — Z1231 Encounter for screening mammogram for malignant neoplasm of breast: Secondary | ICD-10-CM

## 2024-05-14 DIAGNOSIS — E7800 Pure hypercholesterolemia, unspecified: Secondary | ICD-10-CM

## 2024-05-14 DIAGNOSIS — Z0001 Encounter for general adult medical examination with abnormal findings: Secondary | ICD-10-CM

## 2024-05-14 DIAGNOSIS — R7989 Other specified abnormal findings of blood chemistry: Secondary | ICD-10-CM

## 2024-05-14 DIAGNOSIS — I10 Essential (primary) hypertension: Secondary | ICD-10-CM

## 2024-05-14 DIAGNOSIS — E1140 Type 2 diabetes mellitus with diabetic neuropathy, unspecified: Secondary | ICD-10-CM

## 2024-05-14 DIAGNOSIS — Z7985 Long-term (current) use of injectable non-insulin antidiabetic drugs: Secondary | ICD-10-CM

## 2024-05-14 DIAGNOSIS — Z0000 Encounter for general adult medical examination without abnormal findings: Secondary | ICD-10-CM

## 2024-05-14 NOTE — Progress Notes (Signed)
 "  Subjective:    Patient ID: Nina Olson, female    DOB: May 12, 1945, 79 y.o.   MRN: [id number]  HPI Patient is a 79 y/o African female with history of type 2 diabetes mellitus, hypertension, hyperlipidemia, history of CVA, osteopenia, who is here today for complete physical exam.  Mammogram was performed March 2025 and was normal.  She is due again next month.  Patient had a bone density test in February 2025 which showed osteopenia with a T-score of -1.8.  She is not due to recheck that until 2028.  Patient had a colonoscopy in 2020.  Polyp was removed and revealed a tubular adenoma.  Gastroenterologist recommended to repeat colonoscopy in 5 years which would be coming due now. Patient believes she had the RSV vaccine at [facility].  She is due for a tetanus shot but she defers that at the present time Immunization History  Administered Date(s) Administered   Fluad Quad(high Dose 65+) 02/02/2019, 01/09/2021, 12/14/2021, 01/31/2023   INFLUENZA, HIGH DOSE SEASONAL PF 01/26/2017, 02/12/2018, 12/12/2023   Influenza,inj,Quad PF,6+ Mos 03/19/2013, 02/10/2016   Influenza-Unspecified 12/31/2007, 12/30/2013   PFIZER(Purple Top)SARS-COV-2 Vaccination 05/14/2019, 06/04/2019, 01/14/2020   Pfizer(Comirnaty)Fall Seasonal Vaccine 12 years and older 02/16/2022   Pneumococcal Conjugate-13 03/19/2013   Pneumococcal Polysaccharide-23 02/19/2012, 01/01/2014   Tdap 05/26/2014   Zoster Recombinant(Shingrix ) 12/13/2021, 02/16/2022   Zoster, Live 07/27/2015    Past Medical History:  Diagnosis Date   Anemia    Arthritis    Colon polyps    CVA (cerebral vascular accident) (HCC)    CVA (cerebrovascular accident) (HCC)    L MCA   GERD (gastroesophageal reflux disease)    High cholesterol    Hypertension    Osteopenia    Palpitations    Past Surgical History:  Procedure Laterality Date   CESAREAN SECTION     Current Outpatient Medications on File Prior to Visit  Medication Sig Dispense Refill    Accu-Chek Softclix Lancets lancets USE TO CHECK CBG ONCE A DAY 100 each 0   atorvastatin  (LIPITOR) 80 MG tablet Take 1 tablet (80 mg total) by mouth daily. 90 tablet 1   blood glucose meter kit and supplies Dispense based on patient and insurance preference. Check CBG once a day (FOR ICD-10 E10.9, E11.9). 1 each 0   esomeprazole  (NEXIUM ) 40 MG capsule Take 1 capsule by mouth once daily 90 capsule 0   famotidine  (PEPCID ) 40 MG tablet Take 1 tablet by mouth once daily (Patient not taking: Reported on 02/28/2024) 90 tablet 1   gabapentin  (NEURONTIN ) 300 MG capsule Take 1 capsule (300 mg total) by mouth at bedtime as needed (nerve pain in feet). 30 capsule 3   glucose blood (ACCU-CHEK GUIDE) test strip USE TO CHECK CBG ONCE A DAY 100 each 6   hydrochlorothiazide  (HYDRODIURIL ) 25 MG tablet Take 0.5 tablets (12.5 mg total) by mouth daily. Pt reports she takes half of 25 mg tablet 90 tablet 3   losartan  (COZAAR ) 100 MG tablet Take 1 tablet (100 mg total) by mouth daily. 90 tablet 1   meclizine  (ANTIVERT ) 25 MG tablet Take 1 tablet (25 mg total) by mouth 2 (two) times daily as needed for dizziness. 30 tablet 0   Multiple Vitamin (MULTI-VITAMIN) tablet Take by mouth.     Omega-3 Fatty Acids (FISH OIL PO) Take by mouth as directed.     Probiotic Product (PROBIOTIC DAILY PO) Take by mouth.     Semaglutide ,0.25 or 0.5MG /DOS, (OZEMPIC , 0.25 OR 0.5 MG/DOSE,) 2 MG/3ML  SOPN Inject 0.5 mg into the skin once a week. 3 mL 11   valACYclovir  (VALTREX ) 1000 MG tablet Take 1 tablet (1,000 mg total) by mouth 2 (two) times daily. (Patient taking differently: Take 1,000 mg by mouth as needed.) 14 tablet 5   No current facility-administered medications on file prior to visit.   No Known Allergies Social History   Socioeconomic History   Marital status: Married    Spouse name: Not on file   Number of children: 3   Years of education: Not on file   Highest education level: Associate degree: academic program   Occupational History   Not on file  Tobacco Use   Smoking status: Former   Smokeless tobacco: Former    Quit date: 04/09/1978  Vaping Use   Vaping status: Never Used  Substance and Sexual Activity   Alcohol use: No   Drug use: No   Sexual activity: Never  Other Topics Concern   Not on file  Social History Narrative   Are you right handed or left handed? Right   Are you currently employed ? no   What is your current occupation?retired   Do you live at home alone? No    Who lives with you? husband   What type of home do you live in: 1 story or 2 story? one    Caffeine 1 soda a day    Social Drivers of Health   Tobacco Use: Medium Risk (05/14/2024)   Patient History    Smoking Tobacco Use: Former    Smokeless Tobacco Use: Former    Passive Exposure: Not on Actuary Strain: Low Risk (01/22/2024)   Overall Financial Resource Strain (CARDIA)    Difficulty of Paying Living Expenses: Not very hard  Food Insecurity: No Food Insecurity (01/22/2024)   Epic    Worried About Programme Researcher, Broadcasting/film/video in the Last Year: Never true    Ran Out of Food in the Last Year: Never true  Transportation Needs: No Transportation Needs (01/22/2024)   Epic    Lack of Transportation (Medical): No    Lack of Transportation (Non-Medical): No  Physical Activity: Inactive (04/29/2024)   Exercise Vital Sign    Days of Exercise per Week: 0 days    Minutes of Exercise per Session: 0 min  Stress: No Stress Concern Present (04/29/2024)   [employer] of Occupational Health - Occupational Stress Questionnaire    Feeling of Stress: Not at all  Social Connections: Socially Integrated (04/29/2024)   Social Connection and Isolation Panel    Frequency of Communication with Friends and Family: More than three times a week    Frequency of Social Gatherings with Friends and Family: Three times a week    Attends Religious Services: More than 4 times per year    Active Member of Clubs or Organizations:  Yes    Attends Banker Meetings: More than 4 times per year    Marital Status: Married  Catering Manager Violence: Not At Risk (04/25/2023)   Humiliation, Afraid, Rape, and Kick questionnaire    Fear of Current or Ex-Partner: No    Emotionally Abused: No    Physically Abused: No    Sexually Abused: No  Depression (PHQ2-9): Low Risk (04/29/2024)   Depression (PHQ2-9)    PHQ-2 Score: 0  Alcohol Screen: Low Risk (04/25/2023)   Alcohol Screen    Last Alcohol Screening Score (AUDIT): 0  Housing: Low Risk (01/22/2024)   Epic  Unable to Pay for Housing in the Last Year: No    Number of Times Moved in the Last Year: 0    Homeless in the Last Year: No  Utilities: Not At Risk (04/25/2023)   [employer] Utilities    Threatened with loss of utilities: No  Health Literacy: Adequate Health Literacy (04/25/2023)   B1300 Health Literacy    Frequency of need for help with medical instructions: Never     Review of Systems  All other systems reviewed and are negative.      Objective:   Physical Exam Constitutional:      Appearance: Normal appearance. She is normal weight.  HENT:     Right Ear: Tympanic membrane, ear canal and external ear normal.     Left Ear: Tympanic membrane, ear canal and external ear normal.     Mouth/Throat:     Mouth: Mucous membranes are moist.     Pharynx: No oropharyngeal exudate or posterior oropharyngeal erythema.  Eyes:     General: No scleral icterus.    Extraocular Movements: Extraocular movements intact.     Conjunctiva/sclera: Conjunctivae normal.     Pupils: Pupils are equal, round, and reactive to light.  Neck:     Vascular: No carotid bruit.  Cardiovascular:     Rate and Rhythm: Normal rate and regular rhythm.     Pulses: Normal pulses.     Heart sounds: Normal heart sounds. No murmur heard.    No friction rub. No gallop.  Pulmonary:     Effort: Pulmonary effort is normal. No respiratory distress.     Breath sounds: Normal breath sounds.  No stridor. No wheezing, rhonchi or rales.  Abdominal:     General: Bowel sounds are normal. There is no distension.     Palpations: Abdomen is soft. There is no mass.     Tenderness: There is no abdominal tenderness. There is no guarding or rebound.  Musculoskeletal:     Cervical back: Normal range of motion and neck supple.     Right lower leg: No edema.     Left lower leg: No edema.  Lymphadenopathy:     Cervical: No cervical adenopathy.  Skin:    Findings: No erythema or rash.  Neurological:     General: No focal deficit present.     Mental Status: She is alert and oriented to person, place, and time.     Cranial Nerves: No cranial nerve deficit.     Sensory: No sensory deficit.     Motor: No weakness.     Coordination: Coordination normal.     Gait: Gait normal.     Deep Tendon Reflexes: Reflexes normal.  Psychiatric:        Mood and Affect: Mood normal.        Behavior: Behavior normal.        Thought Content: Thought content normal.        Judgment: Judgment normal.           Assessment & Plan:  Benign essential HTN - Plan: CBC with Differential/Platelet, Comprehensive metabolic panel with GFR, Lipid panel, Hemoglobin A1c, Microalbumin / creatinine urine ratio  Controlled type 2 diabetes mellitus with neuropathy (HCC) - Plan: CBC with Differential/Platelet, Comprehensive metabolic panel with GFR, Lipid panel, Hemoglobin A1c, Microalbumin / creatinine urine ratio  Abnormal TSH - Plan: T4, free, TSH  Pure hypercholesterolemia - Plan: CBC with Differential/Platelet, Comprehensive metabolic panel with GFR, Lipid panel, Hemoglobin A1c, Microalbumin / creatinine urine ratio  General medical exam  Encounter for screening mammogram for malignant neoplasm of breast - Plan: MM 3D SCREENING MAMMOGRAM BILATERAL BREAST  Tubular adenoma of colon - Plan: Ambulatory referral to Gastroenterology Physical exam today is completely normal.  Diabetic foot exam is normal.  The  patient does have symptoms of neuropathy however including burning and stinging.  She still has normal sensation to 10 g monofilament.  Recommended a tetanus shot.  Recommended the patient make sure that she has had the RSV vaccine.  I will schedule her for mammogram.  I will also put in a consultation with her gastroenterologist to update her colonoscopy.  Her blood pressure today is excellent but I am concerned she may be having dizzy spells due to low blood pressure.  Of asked her to start checking her blood pressure at home whenever she is dizzy.  If she is getting under 120/60 I would recommend discontinuation of hydrochlorothiazide .  I will check a hemoglobin A1c.  Goal hemoglobin A1c is less than 6.5.  I will also check a microalbumin to creatinine ratio.  I would like to see her LDL cholesterol less than 70 given her history of stroke.  She has a history of an abnormal TSH.  I will repeat a TSH along with a free T4 to determine if she requires supplementation of thyroid  hormone "

## 2024-05-15 ENCOUNTER — Ambulatory Visit: Payer: Self-pay | Admitting: Family Medicine

## 2024-05-15 LAB — CBC WITH DIFFERENTIAL/PLATELET
Absolute Lymphocytes: 1405 {cells}/uL (ref 850–3900)
Absolute Monocytes: 678 {cells}/uL (ref 200–950)
Basophils Absolute: 32 {cells}/uL (ref 0–200)
Basophils Relative: 0.6 %
Eosinophils Absolute: 154 {cells}/uL (ref 15–500)
Eosinophils Relative: 2.9 %
HCT: 40.6 % (ref 35.9–46.0)
Hemoglobin: 13 g/dL (ref 11.7–15.5)
MCH: 28.3 pg (ref 27.0–33.0)
MCHC: 32 g/dL (ref 31.6–35.4)
MCV: 88.5 fL (ref 81.4–101.7)
MPV: 9.2 fL (ref 7.5–12.5)
Monocytes Relative: 12.8 %
Neutro Abs: 3032 {cells}/uL (ref 1500–7800)
Neutrophils Relative %: 57.2 %
Platelets: 304 10*3/uL (ref 140–400)
RBC: 4.59 Million/uL (ref 3.80–5.10)
RDW: 13.4 % (ref 11.0–15.0)
Total Lymphocyte: 26.5 %
WBC: 5.3 10*3/uL (ref 3.8–10.8)

## 2024-05-15 LAB — HEMOGLOBIN A1C
Hgb A1c MFr Bld: 6.1 % — ABNORMAL HIGH
Mean Plasma Glucose: 128 mg/dL
eAG (mmol/L): 7.1 mmol/L

## 2024-05-15 LAB — LIPID PANEL
Cholesterol: 145 mg/dL
HDL: 50 mg/dL
LDL Cholesterol (Calc): 77 mg/dL
Non-HDL Cholesterol (Calc): 95 mg/dL
Total CHOL/HDL Ratio: 2.9 (calc)
Triglycerides: 94 mg/dL

## 2024-05-15 LAB — COMPREHENSIVE METABOLIC PANEL WITH GFR
AG Ratio: 1.6 (calc) (ref 1.0–2.5)
ALT: 21 U/L (ref 6–29)
AST: 20 U/L (ref 10–35)
Albumin: 3.8 g/dL (ref 3.6–5.1)
Alkaline phosphatase (APISO): 90 U/L (ref 37–153)
BUN: 12 mg/dL (ref 7–25)
CO2: 29 mmol/L (ref 20–32)
Calcium: 9.2 mg/dL (ref 8.6–10.4)
Chloride: 105 mmol/L (ref 98–110)
Creat: 0.87 mg/dL (ref 0.60–1.00)
Globulin: 2.4 g/dL (ref 1.9–3.7)
Glucose, Bld: 85 mg/dL (ref 65–99)
Potassium: 4.3 mmol/L (ref 3.5–5.3)
Sodium: 143 mmol/L (ref 135–146)
Total Bilirubin: 0.7 mg/dL (ref 0.2–1.2)
Total Protein: 6.2 g/dL (ref 6.1–8.1)
eGFR: 69 mL/min/{1.73_m2}

## 2024-05-15 LAB — TSH: TSH: 0.51 m[IU]/L (ref 0.40–4.50)

## 2024-05-15 LAB — T4, FREE: Free T4: 1 ng/dL (ref 0.8–1.8)

## 2024-06-19 ENCOUNTER — Ambulatory Visit: Admitting: Neurology

## 2024-09-10 ENCOUNTER — Ambulatory Visit: Admitting: Neurology

## 2025-05-17 ENCOUNTER — Encounter: Admitting: Family Medicine
